# Patient Record
Sex: Female | Born: 1940 | Race: White | Hispanic: No | Smoking: Former smoker
Health system: Southern US, Community
[De-identification: ages and names within clinical notes are randomized; demographics above are authoritative.]

## PROBLEM LIST (undated history)

## (undated) DIAGNOSIS — F329 Major depressive disorder, single episode, unspecified: Secondary | ICD-10-CM

## (undated) DIAGNOSIS — E785 Hyperlipidemia, unspecified: Secondary | ICD-10-CM

## (undated) DIAGNOSIS — D649 Anemia, unspecified: Secondary | ICD-10-CM

## (undated) DIAGNOSIS — C801 Malignant (primary) neoplasm, unspecified: Secondary | ICD-10-CM

## (undated) DIAGNOSIS — M1990 Unspecified osteoarthritis, unspecified site: Secondary | ICD-10-CM

## (undated) DIAGNOSIS — Z881 Allergy status to other antibiotic agents status: Secondary | ICD-10-CM

## (undated) DIAGNOSIS — I6529 Occlusion and stenosis of unspecified carotid artery: Secondary | ICD-10-CM

## (undated) DIAGNOSIS — I10 Essential (primary) hypertension: Secondary | ICD-10-CM

## (undated) HISTORY — PX: THROAT SURGERY: SHX803

## (undated) HISTORY — DX: Anemia, unspecified: D64.9

## (undated) HISTORY — PX: APPENDECTOMY: SHX54

## (undated) HISTORY — DX: Essential (primary) hypertension: I10

## (undated) HISTORY — DX: Hyperlipidemia, unspecified: E78.5

## (undated) HISTORY — DX: Major depressive disorder, single episode, unspecified: F32.9

## (undated) HISTORY — DX: Allergy status to other antibiotic agents: Z88.1

## (undated) HISTORY — DX: Malignant (primary) neoplasm, unspecified: C80.1

## (undated) HISTORY — PX: CHOLECYSTECTOMY: SHX55

## (undated) HISTORY — DX: Occlusion and stenosis of unspecified carotid artery: I65.29

## (undated) HISTORY — DX: Unspecified osteoarthritis, unspecified site: M19.90

---

## 1993-01-24 HISTORY — PX: JOINT REPLACEMENT: SHX530

## 1998-04-14 ENCOUNTER — Other Ambulatory Visit: Admission: RE | Admit: 1998-04-14 | Discharge: 1998-04-14 | Payer: Self-pay | Admitting: Family Medicine

## 1998-06-05 ENCOUNTER — Encounter: Admission: RE | Admit: 1998-06-05 | Discharge: 1998-09-03 | Payer: Self-pay | Admitting: *Deleted

## 1999-01-25 HISTORY — PX: JOINT REPLACEMENT: SHX530

## 1999-03-15 ENCOUNTER — Encounter: Admission: RE | Admit: 1999-03-15 | Discharge: 1999-03-24 | Payer: Self-pay | Admitting: Unknown Physician Specialty

## 1999-06-22 ENCOUNTER — Other Ambulatory Visit: Admission: RE | Admit: 1999-06-22 | Discharge: 1999-06-22 | Payer: Self-pay | Admitting: Family Medicine

## 2000-08-17 ENCOUNTER — Other Ambulatory Visit: Admission: RE | Admit: 2000-08-17 | Discharge: 2000-08-17 | Payer: Self-pay | Admitting: Family Medicine

## 2002-04-02 ENCOUNTER — Ambulatory Visit (HOSPITAL_COMMUNITY): Admission: RE | Admit: 2002-04-02 | Discharge: 2002-04-02 | Payer: Self-pay | Admitting: Gastroenterology

## 2003-05-06 ENCOUNTER — Other Ambulatory Visit: Admission: RE | Admit: 2003-05-06 | Discharge: 2003-05-06 | Payer: Self-pay | Admitting: Family Medicine

## 2004-06-15 ENCOUNTER — Other Ambulatory Visit: Admission: RE | Admit: 2004-06-15 | Discharge: 2004-06-15 | Payer: Self-pay | Admitting: Family Medicine

## 2005-01-26 ENCOUNTER — Encounter: Admission: RE | Admit: 2005-01-26 | Discharge: 2005-01-26 | Payer: Self-pay | Admitting: Dentistry

## 2005-02-01 ENCOUNTER — Ambulatory Visit: Admission: RE | Admit: 2005-02-01 | Discharge: 2005-04-08 | Payer: Self-pay | Admitting: Radiation Oncology

## 2005-02-02 ENCOUNTER — Ambulatory Visit: Payer: Self-pay | Admitting: Dentistry

## 2005-03-07 ENCOUNTER — Ambulatory Visit: Payer: Self-pay | Admitting: Dentistry

## 2005-06-14 ENCOUNTER — Other Ambulatory Visit: Admission: RE | Admit: 2005-06-14 | Discharge: 2005-06-14 | Payer: Self-pay | Admitting: Family Medicine

## 2009-01-24 DIAGNOSIS — F32A Depression, unspecified: Secondary | ICD-10-CM

## 2009-01-24 HISTORY — DX: Depression, unspecified: F32.A

## 2009-05-11 DIAGNOSIS — I6529 Occlusion and stenosis of unspecified carotid artery: Secondary | ICD-10-CM

## 2009-05-11 HISTORY — DX: Occlusion and stenosis of unspecified carotid artery: I65.29

## 2010-12-21 ENCOUNTER — Other Ambulatory Visit: Payer: Self-pay | Admitting: Radiation Oncology

## 2011-06-22 ENCOUNTER — Other Ambulatory Visit: Payer: Self-pay | Admitting: Radiation Oncology

## 2012-02-17 ENCOUNTER — Encounter: Payer: Self-pay | Admitting: Vascular Surgery

## 2012-02-17 ENCOUNTER — Other Ambulatory Visit: Payer: Self-pay | Admitting: *Deleted

## 2012-03-05 ENCOUNTER — Encounter: Payer: Self-pay | Admitting: Vascular Surgery

## 2012-03-06 ENCOUNTER — Ambulatory Visit (INDEPENDENT_AMBULATORY_CARE_PROVIDER_SITE_OTHER): Payer: Medicare Other | Admitting: Vascular Surgery

## 2012-03-06 ENCOUNTER — Encounter: Payer: Self-pay | Admitting: Vascular Surgery

## 2012-03-06 VITALS — BP 157/74 | HR 88 | Ht 61.0 in | Wt 176.0 lb

## 2012-03-06 DIAGNOSIS — I6529 Occlusion and stenosis of unspecified carotid artery: Secondary | ICD-10-CM | POA: Insufficient documentation

## 2012-03-06 NOTE — Progress Notes (Signed)
[facility] [place]   Patient name: Mattia Osterman MRN: [id number] DOB: 04-01-1940 Sex: female   Referred by: Modesto Charon  Reason for referral:  Chief Complaint  Patient presents with  . New Evaluation    carotid stenosis/bruit    HISTORY OF PRESENT ILLNESS: Patient is a very pleasant 72 year old female seen today for evaluation of bilateral moderate carotid stenosis, asymptomatic. She was found to have a carotid bruit and underwent a recent carotid duplex at [facility]. I do have the study for view. She did have a prior study in 2008 as well for comparison. Patient this is specifically denies any prior amaurosis fugax transient ischemic attack or stroke. She does have a history of cardiac palpitations but denies any severe cardiac disease. She has active with no major disability  Past Medical History  Diagnosis Date  . Carotid artery occlusion 05/11/09    Bruit - Right  . Hyperlipidemia   . Hypertension   . Arthritis   . Allergy to meropenem   . Anemia   . Depression 2011  . Cancer     Oral    Past Surgical History  Procedure Laterality Date  . Cholecystectomy    . Joint replacement Right 1995      Knee  . Joint replacement Left 2001    knee  . Appendectomy      History   Social History  . Marital Status: Divorced    Spouse Name: N/A    Number of Children: N/A  . Years of Education: N/A   Occupational History  . Not on file.   Social History Main Topics  . Smoking status: Former Smoker    Types: Cigarettes    Quit date: 01/24/1993  . Smokeless tobacco: Never Used  . Alcohol Use: No  . Drug Use: No  . Sexually Active: Not on file   Other Topics Concern  . Not on file   Social History Narrative  . No narrative on file    Family History  Problem Relation Age of Onset  . Heart disease Father   . Heart attack Father   . Heart disease Sister     before age 33  . Hyperlipidemia Sister   . Hypertension Sister   . Other Sister      varicose veins  . Cancer Brother   . Diabetes Son     Allergies as of 03/06/2012 - Review Complete 03/06/2012  Allergen Reaction Noted  . Sulfa antibiotics  02/17/2012    Current Outpatient Prescriptions on File Prior to Visit  Medication Sig Dispense Refill  . amLODipine (NORVASC) 5 MG tablet Take 5 mg by mouth daily.      Marland Kitchen aspirin 81 MG tablet Take 81 mg by mouth daily.      . Cholecalciferol (VITAMIN D3) 3000 UNITS TABS Take by mouth.      . dexlansoprazole (DEXILANT) 60 MG capsule Take 60 mg by mouth daily.      Marland Kitchen escitalopram (LEXAPRO) 20 MG tablet Take 20 mg by mouth daily.      Marland Kitchen ezetimibe (ZETIA) 10 MG tablet Take 10 mg by mouth daily.      . fenofibrate (TRICOR) 145 MG tablet Take 145 mg by mouth daily.      . fish oil-omega-3 fatty acids 1000 MG capsule Take 2 g by mouth daily.      . folic acid (FOLVITE) 1 MG tablet Take 1 mg by mouth daily.      Marland Kitchen  pilocarpine (SALAGEN) 5 MG tablet TAKE 1 TABLET BY MOUTH 3 TIMES DAILY AND AT BEDTIME AS NEEDED FOR DRYMOUTH  90 tablet  3  . pravastatin (PRAVACHOL) 10 MG tablet Take 10 mg by mouth daily.      Marland Kitchen buPROPion (WELLBUTRIN SR) 200 MG 12 hr tablet Take 200 mg by mouth 2 (two) times daily.      . Cyanocobalamin (VITAMIN B 12 PO) Take by mouth.      . zolpidem (AMBIEN) 10 MG tablet Take 10 mg by mouth at bedtime as needed.       No current facility-administered medications on file prior to visit.     REVIEW OF SYSTEMS:  Positives indicated with an "X"  CARDIOVASCULAR:  [ ]  chest pain   [ ]  chest pressure   [x ] palpitations   [ ]  orthopnea   [x ] dyspnea on exertion   [ ]  claudication   [ ]  rest pain   [ ]  DVT   [ ]  phlebitis PULMONARY:   [ ]  productive cough   [ ]  asthma   [ ]  wheezing NEUROLOGIC:   [x ] weakness  [x ] paresthesias  [ ]  aphasia  [ ]  amaurosis  [ ]  dizziness HEMATOLOGIC:   [ ]  bleeding problems   [ ]  clotting disorders MUSCULOSKELETAL:  [ ]  joint pain   [ ]  joint swelling GASTROINTESTINAL: [ ]   blood in  stool  [ ]   hematemesis GENITOURINARY:  [ ]   dysuria  [ ]   hematuria PSYCHIATRIC:  [ ]  history of major depression INTEGUMENTARY:  [ ]  rashes  [ ]  ulcers CONSTITUTIONAL:  [ ]  fever   [ ]  chills  PHYSICAL EXAMINATION:  General: The patient is a well-nourished female, in no acute distress. Vital signs are BP 157/74  Pulse 88  Ht 5\' 1"  (1.549 m)  Wt 176 lb (79.833 kg)  BMI 33.27 kg/m2  SpO2 98% Pulmonary: There is a good air exchange bilaterally without wheezing or rales. Abdomen: Soft and non-tender with normal pitch bowel sounds. Musculoskeletal: There are no major deformities.  There is no significant extremity pain. Neurologic: No focal weakness or paresthesias are detected, Skin: There are no ulcer or rashes noted. Psychiatric: The patient has normal affect. Cardiovascular: There is a regular rate and rhythm without significant murmur appreciated. Carotid arteries reveal soft bruit on the right. I do not appreciate a bruit on the left Pulse status 2+ radial 2+ femoral 2+ dorsalis pedis pulses bilaterally   Vascular Lab Studies: Reviewed her study from 2012/02/25 at [facility]. This reveals a 50-69% stenoses bilaterally with calcified plaque. Her flow velocities atelectasis in the lower end of this range.   Impression and Plan:  Moderate asymptomatic bilateral carotid stenosis. I discussed this at length with the patient and her family present. I recommend that we see her again in one year for continued duplex surveillance. I explained symptoms of carotid disease to the patient and her family and they will notify should this occur. As long as she remains asymptomatic we will duplex surveillance in our office.    Bailley Guilford [facility] [place] Office: [phone]

## 2012-03-07 NOTE — Addendum Note (Signed)
Addended by: Sharee Pimple on: 03/07/2012 09:58 AM   Modules accepted: Orders

## 2012-04-24 ENCOUNTER — Ambulatory Visit (INDEPENDENT_AMBULATORY_CARE_PROVIDER_SITE_OTHER): Payer: Medicare Other | Admitting: Family Medicine

## 2012-04-24 ENCOUNTER — Other Ambulatory Visit: Payer: Self-pay | Admitting: Family Medicine

## 2012-04-24 ENCOUNTER — Encounter: Payer: Self-pay | Admitting: Family Medicine

## 2012-04-24 VITALS — BP 140/72 | HR 82 | Temp 97.4°F | Ht 60.5 in | Wt 176.8 lb

## 2012-04-24 DIAGNOSIS — E785 Hyperlipidemia, unspecified: Secondary | ICD-10-CM

## 2012-04-24 DIAGNOSIS — R739 Hyperglycemia, unspecified: Secondary | ICD-10-CM

## 2012-04-24 DIAGNOSIS — G4700 Insomnia, unspecified: Secondary | ICD-10-CM

## 2012-04-24 DIAGNOSIS — M129 Arthropathy, unspecified: Secondary | ICD-10-CM

## 2012-04-24 DIAGNOSIS — D649 Anemia, unspecified: Secondary | ICD-10-CM

## 2012-04-24 DIAGNOSIS — I10 Essential (primary) hypertension: Secondary | ICD-10-CM | POA: Insufficient documentation

## 2012-04-24 DIAGNOSIS — M1990 Unspecified osteoarthritis, unspecified site: Secondary | ICD-10-CM | POA: Insufficient documentation

## 2012-04-24 DIAGNOSIS — R7309 Other abnormal glucose: Secondary | ICD-10-CM

## 2012-04-24 LAB — POCT CBC
Granulocyte percent: 46.6 %G (ref 37–80)
HCT, POC: 32 % — AB (ref 37.7–47.9)
Hemoglobin: 10.8 g/dL — AB (ref 12.2–16.2)
Lymph, poc: 2.2 (ref 0.6–3.4)
MCH, POC: 29.6 pg (ref 27–31.2)
MCHC: 33.8 g/dL (ref 31.8–35.4)
MCV: 87.5 fL (ref 80–97)
MPV: 7.9 fL (ref 0–99.8)
POC Granulocyte: 2.3 (ref 2–6.9)
POC LYMPH PERCENT: 43.2 %L (ref 10–50)
Platelet Count, POC: 235 10*3/uL (ref 142–424)
RBC: 3.7 M/uL — AB (ref 4.04–5.48)
RDW, POC: 14.3 %
WBC: 5 10*3/uL (ref 4.6–10.2)

## 2012-04-24 LAB — POCT GLYCOSYLATED HEMOGLOBIN (HGB A1C): Hemoglobin A1C: 4.9

## 2012-04-24 NOTE — Progress Notes (Signed)
Subjective:     Patient ID: Shirley Brown, female   DOB: [birth year], 72 y.o.   MRN: [id number]  HPI Patient comes in for follow up of her medical problem. The has difficulty sleeping at night. Her insurance has limited the duration now for Zolpidem prescription. After this last prescription she will need a different medicine for her insomnia. She will consider anything even trazodone. Arthritis is status quo. Hypertension no headache chest pain palpitations nor pedal edema.  history of anemia which we need to recheck. Also a history of hyperlipidemia and hypertension and hyperglycemia.    PMH/PSH: reviewed/updated in Epic  SH/FH: reviewed/updated in Epic  Allergies: reviewed/updated in Epic  Medications: reviewed/updated in Epic  Immunizations: reviewed/updated in Epic     Review of Systems No other complaints    Objective:   Physical Exam On examination she appeared in good health and spirits. Short stature obese.  Vital signs as documented.BP 140/72  Pulse 82  Temp(Src) 97.4 F (36.3 C) (Oral)  Ht 5' 0.5" (1.537 m)  Wt 176 lb 12.8 oz (80.196 kg)  BMI 33.95 kg/m2  Skin warm and dry and without overt rashes.  Neck without JVD.  Lungs clear.  Heart exam notable for regular rhythm, normal sounds and absence of murmurs, rubs or gallops. Abdomen unremarkable and without evidence of organomegaly, masses, or abdominal aortic enlargement.  Extremities nonedematous.    Assessment:     HLD (hyperlipidemia) - Plan: COMPLETE METABOLIC PANEL WITH GFR, NMR Lipoprofile with Lipids  Arthritis - Plan: COMPLETE METABOLIC PANEL WITH GFR  HTN (hypertension)  Hyperglycemia - Plan: COMPLETE METABOLIC PANEL WITH GFR, POCT glycosylated hemoglobin (Hb A1C)  Insomnia  Anemia, unspecified - Plan: POCT CBC       Plan:     Orders Placed This Encounter  Procedures  . COMPLETE METABOLIC PANEL WITH GFR  . NMR Lipoprofile with Lipids  . POCT glycosylated hemoglobin (Hb A1C)  .  POCT CBC   Results for orders placed in visit on 04/24/12 (from the past 24 hour(s))  POCT GLYCOSYLATED HEMOGLOBIN (HGB A1C)     Status: None   Collection Time    04/24/12 12:55 PM      Result Value Range   Hemoglobin A1C 4.9    POCT CBC     Status: Abnormal   Collection Time    04/24/12 12:55 PM      Result Value Range   WBC 5.0  4.6 - 10.2 K/uL   Lymph, poc 2.2  0.6 - 3.4   POC LYMPH PERCENT 43.2  10 - 50 %L   POC Granulocyte 2.3  2 - 6.9   Granulocyte percent 46.6  37 - 80 %G   RBC 3.7 (*) 4.04 - 5.48 M/uL   Hemoglobin 10.8 (*) 12.2 - 16.2 g/dL   HCT, POC 46.9 (*) 62.9 - 47.9 %   MCV 87.5  80 - 97 fL   MCH, POC 29.6  27 - 31.2 pg   MCHC 33.8  31.8 - 35.4 g/dL   RDW, POC 52.8     Platelet Count, POC 235.0  142 - 424 K/uL   MPV 7.9  0 - 99.8 fL   Discussed diet and exercise. Weight reduction. Discuss alternative to Ambien/zolpidem which she takes for sleep. Just started the last 30 days of zolpidem . Consider trazodone or some other medications for sleep. Await lab results.  No orders of the defined types were placed in this encounter.   Jimel Myler P. [place] Charon, M.D.

## 2012-04-25 LAB — COMPLETE METABOLIC PANEL WITH GFR
ALT: 9 U/L (ref 0–35)
AST: 21 U/L (ref 0–37)
Albumin: 4.1 g/dL (ref 3.5–5.2)
Alkaline Phosphatase: 49 U/L (ref 39–117)
BUN: 12 mg/dL (ref 6–23)
CO2: 27 mEq/L (ref 19–32)
Calcium: 10 mg/dL (ref 8.4–10.5)
Chloride: 99 mEq/L (ref 96–112)
Creat: 0.88 mg/dL (ref 0.50–1.10)
GFR, Est African American: 77 mL/min
GFR, Est Non African American: 67 mL/min
Glucose, Bld: 82 mg/dL (ref 70–99)
Potassium: 4.6 mEq/L (ref 3.5–5.3)
Sodium: 137 mEq/L (ref 135–145)
Total Bilirubin: 0.5 mg/dL (ref 0.3–1.2)
Total Protein: 6.4 g/dL (ref 6.0–8.3)

## 2012-04-25 LAB — NMR LIPOPROFILE WITH LIPIDS
Cholesterol, Total: 131 mg/dL (ref <200)
HDL Particle Number: 49 umol/L (ref >=30.5)
HDL Size: 9.2 nm (ref >=9.2)
HDL-C: 47 mg/dL (ref >=40)
LDL (calc): 58 mg/dL (ref <100)
LDL Particle Number: 984 nmol/L (ref <1000)
LDL Size: 20.1 nm — ABNORMAL LOW (ref >20.5)
LP-IR Score: 63 — ABNORMAL HIGH (ref <=45)
Large HDL-P: 4.8 umol/L (ref >=4.8)
Large VLDL-P: 5.2 nmol/L — ABNORMAL HIGH (ref <=2.7)
Small LDL Particle Number: 682 nmol/L — ABNORMAL HIGH (ref <=527)
Triglycerides: 132 mg/dL (ref <150)
VLDL Size: 54.4 nm — ABNORMAL HIGH (ref >=46.6)

## 2012-04-26 ENCOUNTER — Other Ambulatory Visit: Payer: Self-pay | Admitting: Family Medicine

## 2012-04-26 ENCOUNTER — Encounter: Payer: Self-pay | Admitting: *Deleted

## 2012-05-26 ENCOUNTER — Other Ambulatory Visit: Payer: Self-pay | Admitting: Family Medicine

## 2012-06-07 ENCOUNTER — Telehealth: Payer: Self-pay | Admitting: Family Medicine

## 2012-06-07 ENCOUNTER — Other Ambulatory Visit: Payer: Self-pay | Admitting: Family Medicine

## 2012-06-07 DIAGNOSIS — G4700 Insomnia, unspecified: Secondary | ICD-10-CM

## 2012-06-07 MED ORDER — TRAZODONE 25 MG HALF TABLET: 25.0000 mg | ORAL_TABLET | Freq: Every day | ORAL | Status: DC

## 2012-06-07 NOTE — Telephone Encounter (Signed)
Ordered trazodone in EPIC 25 mg at night.please let her know. Thanks.

## 2012-06-08 NOTE — Telephone Encounter (Signed)
Pt aware of new script- trazodone

## 2012-06-21 ENCOUNTER — Encounter: Payer: Self-pay | Admitting: *Deleted

## 2012-06-25 ENCOUNTER — Other Ambulatory Visit: Payer: Self-pay | Admitting: Family Medicine

## 2012-07-04 ENCOUNTER — Other Ambulatory Visit: Payer: Self-pay | Admitting: Family Medicine

## 2012-07-26 ENCOUNTER — Other Ambulatory Visit: Payer: Self-pay | Admitting: Family Medicine

## 2012-07-26 ENCOUNTER — Ambulatory Visit: Payer: Medicare Other | Admitting: Family Medicine

## 2012-07-29 ENCOUNTER — Other Ambulatory Visit: Payer: Self-pay | Admitting: Family Medicine

## 2012-07-30 ENCOUNTER — Ambulatory Visit (INDEPENDENT_AMBULATORY_CARE_PROVIDER_SITE_OTHER): Payer: Medicare Other | Admitting: Family Medicine

## 2012-07-30 ENCOUNTER — Encounter: Payer: Self-pay | Admitting: Family Medicine

## 2012-07-30 VITALS — BP 117/62 | HR 92 | Temp 97.5°F | Wt 178.6 lb

## 2012-07-30 DIAGNOSIS — M129 Arthropathy, unspecified: Secondary | ICD-10-CM

## 2012-07-30 DIAGNOSIS — G4700 Insomnia, unspecified: Secondary | ICD-10-CM

## 2012-07-30 DIAGNOSIS — M1990 Unspecified osteoarthritis, unspecified site: Secondary | ICD-10-CM

## 2012-07-30 DIAGNOSIS — R739 Hyperglycemia, unspecified: Secondary | ICD-10-CM

## 2012-07-30 DIAGNOSIS — K219 Gastro-esophageal reflux disease without esophagitis: Secondary | ICD-10-CM

## 2012-07-30 DIAGNOSIS — I10 Essential (primary) hypertension: Secondary | ICD-10-CM

## 2012-07-30 DIAGNOSIS — E785 Hyperlipidemia, unspecified: Secondary | ICD-10-CM

## 2012-07-30 DIAGNOSIS — I6529 Occlusion and stenosis of unspecified carotid artery: Secondary | ICD-10-CM

## 2012-07-30 DIAGNOSIS — R7309 Other abnormal glucose: Secondary | ICD-10-CM

## 2012-07-30 LAB — COMPLETE METABOLIC PANEL WITH GFR
ALT: 8 U/L (ref 0–35)
AST: 23 U/L (ref 0–37)
Albumin: 4.6 g/dL (ref 3.5–5.2)
Alkaline Phosphatase: 35 U/L — ABNORMAL LOW (ref 39–117)
BUN: 13 mg/dL (ref 6–23)
CO2: 28 mEq/L (ref 19–32)
Calcium: 9.7 mg/dL (ref 8.4–10.5)
Chloride: 103 mEq/L (ref 96–112)
Creat: 1.11 mg/dL — ABNORMAL HIGH (ref 0.50–1.10)
GFR, Est African American: 58 mL/min — ABNORMAL LOW
GFR, Est Non African American: 50 mL/min — ABNORMAL LOW
Glucose, Bld: 152 mg/dL — ABNORMAL HIGH (ref 70–99)
Potassium: 4 mEq/L (ref 3.5–5.3)
Sodium: 137 mEq/L (ref 135–145)
Total Bilirubin: 0.4 mg/dL (ref 0.3–1.2)
Total Protein: 6.7 g/dL (ref 6.0–8.3)

## 2012-07-30 MED ORDER — PANTOPRAZOLE SODIUM 40 MG PO TBEC
40.0000 mg | DELAYED_RELEASE_TABLET | Freq: Every day | ORAL | Status: DC
Start: 2012-07-30 — End: 2012-11-19

## 2012-07-30 MED ORDER — MELOXICAM 15 MG PO TABS: 15.0000 mg | ORAL_TABLET | Freq: Every day | ORAL | Status: DC

## 2012-07-30 NOTE — Progress Notes (Signed)
Patient ID: Shirley Brown, female   DOB: 1940-02-28, 72 y.o.   MRN: [id number] SUBJECTIVE: CC: Chief Complaint  Patient presents with  . Follow-up    3 month follow up needs something other than dexilant cant afford . wants something to rest    HPI: Patient is here for follow up of hyperlipidemia/hypertension/hyperglycemia: denies Headache;denies Chest Pain;denies weakness;denies Shortness of Breath and orthopnea;denies Visual changes;denies palpitations;denies cough;denies pedal edema;denies symptoms of TIA or stroke;deniesClaudication symptoms. admits to Compliance with medications; denies Problems with medications.  Having chronic back pain, spine crackles and pops.tried tylenol.  Past Medical History  Diagnosis Date  . Carotid artery occlusion 05/11/09    Bruit - Right  . Hyperlipidemia   . Hypertension   . Arthritis   . Allergy to meropenem   . Anemia   . Depression 2011  . Cancer     Oral   Past Surgical History  Procedure Laterality Date  . Cholecystectomy    . Joint replacement Right 1995      Knee  . Joint replacement Left 2001    knee  . Appendectomy     History   Social History  . Marital Status: Divorced    Spouse Name: N/A    Number of Children: N/A  . Years of Education: N/A   Occupational History  . Not on file.   Social History Main Topics  . Smoking status: Former Smoker    Types: Cigarettes    Quit date: 01/24/1993  . Smokeless tobacco: Never Used  . Alcohol Use: No  . Drug Use: No  . Sexually Active: Not on file   Other Topics Concern  . Not on file   Social History Narrative  . No narrative on file   Family History  Problem Relation Age of Onset  . Heart disease Father   . Heart attack Father   . Heart disease Sister     before age 53  . Hyperlipidemia Sister   . Hypertension Sister   . Other Sister     varicose veins  . Cancer Brother   . Diabetes Son    Current Outpatient Prescriptions on File Prior to Visit   Medication Sig Dispense Refill  . amLODipine (NORVASC) 5 MG tablet Take 5 mg by mouth daily.      Marland Kitchen aspirin 81 MG tablet Take 81 mg by mouth daily.      . Cholecalciferol (VITAMIN D3) 3000 UNITS TABS Take by mouth.      . Cyanocobalamin (VITAMIN B 12 PO) Take by mouth.      . dexlansoprazole (DEXILANT) 60 MG capsule Take 60 mg by mouth daily.      Marland Kitchen escitalopram (LEXAPRO) 20 MG tablet Take 20 mg by mouth daily.      . fenofibrate (TRICOR) 145 MG tablet TAKE ONE TABLET BY MOUTH ONE TIME DAILY  30 tablet  3  . fish oil-omega-3 fatty acids 1000 MG capsule Take 2 g by mouth daily.      . folic acid (FOLVITE) 1 MG tablet TAKE ONE TABLET BY MOUTH ONE TIME DAILY  30 tablet  1  . pilocarpine (SALAGEN) 5 MG tablet TAKE 1 TABLET BY MOUTH 3 TIMES DAILY AND AT BEDTIME AS NEEDED FOR DRYMOUTH  90 tablet  3  . pravastatin (PRAVACHOL) 10 MG tablet TAKE ONE TABLET BY MOUTH AT BEDTIME  30 tablet  3  . vitamin E 200 UNIT capsule Take 200 Units by mouth daily.      Marland Kitchen  ZETIA 10 MG tablet TAKE ONE TABLET BY MOUTH ONE TIME DAILY  30 tablet  1  . zolpidem (AMBIEN) 10 MG tablet Take 10 mg by mouth at bedtime as needed.      Marland Kitchen buPROPion (WELLBUTRIN SR) 200 MG 12 hr tablet TAKE ONE TABLET BY MOUTH ONE  TIME DAILY  30 tablet  1  . traZODone (DESYREL) 25 mg TABS Take 0.5 tablets (25 mg total) by mouth at bedtime.  15 tablet  3   No current facility-administered medications on file prior to visit.   Allergies  Allergen Reactions  . Bupropion     Lips swelled  . Desyrel (Trazodone)     Keeps me up all night  . Sulfa Antibiotics     There is no immunization history on file for this patient. Prior to Admission medications   Medication Sig Start Date End Date Taking? Authorizing Provider  amLODipine (NORVASC) 5 MG tablet Take 5 mg by mouth daily.   Yes Historical Provider, MD  aspirin 81 MG tablet Take 81 mg by mouth daily.   Yes Historical Provider, MD  Cholecalciferol (VITAMIN D3) 3000 UNITS TABS Take by mouth.    Yes Historical Provider, MD  Cyanocobalamin (VITAMIN B 12 PO) Take by mouth.   Yes Historical Provider, MD  dexlansoprazole (DEXILANT) 60 MG capsule Take 60 mg by mouth daily.   Yes Historical Provider, MD  escitalopram (LEXAPRO) 20 MG tablet Take 20 mg by mouth daily.   Yes Historical Provider, MD  fenofibrate (TRICOR) 145 MG tablet TAKE ONE TABLET BY MOUTH ONE TIME DAILY 06/25/12  Yes Ileana Ladd, MD  fish oil-omega-3 fatty acids 1000 MG capsule Take 2 g by mouth daily.   Yes Historical Provider, MD  folic acid (FOLVITE) 1 MG tablet TAKE ONE TABLET BY MOUTH ONE TIME DAILY 07/26/12  Yes Ileana Ladd, MD  pilocarpine (SALAGEN) 5 MG tablet TAKE 1 TABLET BY MOUTH 3 TIMES DAILY AND AT BEDTIME AS NEEDED FOR [facility] 06/22/11  Yes Oneita Hurt, MD  pravastatin (PRAVACHOL) 10 MG tablet TAKE ONE TABLET BY MOUTH AT BEDTIME 07/04/12  Yes Ileana Ladd, MD  vitamin E 200 UNIT capsule Take 200 Units by mouth daily.   Yes Historical Provider, MD  ZETIA 10 MG tablet TAKE ONE TABLET BY MOUTH ONE TIME DAILY 07/26/12  Yes Ileana Ladd, MD  zolpidem (AMBIEN) 10 MG tablet Take 10 mg by mouth at bedtime as needed.   Yes Historical Provider, MD  buPROPion (WELLBUTRIN SR) 200 MG 12 hr tablet TAKE ONE TABLET BY MOUTH ONE  TIME DAILY 05/26/12   Ileana Ladd, MD  traZODone (DESYREL) 25 mg TABS Take 0.5 tablets (25 mg total) by mouth at bedtime. 06/07/12   Ileana Ladd, MD    ROS: As above in the HPI. All other systems are stable or negative.  OBJECTIVE: APPEARANCE:  Patient in no acute distress.The patient appeared well nourished and normally developed. Acyanotic. Waist: VITAL SIGNS:BP 117/62  Pulse 92  Temp(Src) 97.5 F (36.4 C) (Oral)  Wt 178 lb 9.6 oz (81.012 kg)  BMI 34.29 kg/m2  Obese WF  SKIN: warm and  Dry without overt rashes, tattoos and scars  HEAD and Neck: without JVD, Head and scalp: normal Eyes:No scleral icterus. Fundi normal, eye movements normal. Ears: Auricle normal, canal  normal, Tympanic membranes normal, insufflation normal. Nose: normal Throat: normal Neck & thyroid: normal  CHEST & LUNGS: Chest wall: normal Lungs: Clear  CVS: Reveals the  PMI to be normally located. Regular rhythm, First and Second Heart sounds are normal,  absence of murmurs, rubs or gallops. Peripheral vasculature: Radial pulses: normal Dorsal pedis pulses: normal Posterior pulses: normal  ABDOMEN:  Appearance: obese Benign, no organomegaly, no masses, no Abdominal Aortic enlargement. No Guarding , no rebound. No Bruits. Bowel sounds: normal  RECTAL: N/A GU: N/A  EXTREMETIES: nonedematous. Both Femoral and Pedal pulses are normal.  MUSCULOSKELETAL:  Spine: mild kyphosis Knees: crepitus and mild pain on ROM.ligaments intact.  NEUROLOGIC: oriented to time,place and person; nonfocal.  ASSESSMENT: HLD (hyperlipidemia) - Plan: COMPLETE METABOLIC PANEL WITH GFR, NMR Lipoprofile with Lipids  HTN (hypertension) - Plan: COMPLETE METABOLIC PANEL WITH GFR  Hyperglycemia  Insomnia  Occlusion and stenosis of carotid artery without mention of cerebral infarction, unspecified laterality  Arthritis - Plan: meloxicam (MOBIC) 15 MG tablet, COMPLETE METABOLIC PANEL WITH GFR  GERD (gastroesophageal reflux disease) - Plan: pantoprazole (PROTONIX) 40 MG tablet  PLAN: Orders Placed This Encounter  Procedures  . COMPLETE METABOLIC PANEL WITH GFR  . NMR Lipoprofile with Lipids   Meds ordered this encounter  Medications  . meloxicam (MOBIC) 15 MG tablet    Sig: Take 1 tablet (15 mg total) by mouth daily.    Dispense:  30 tablet    Refill:  3  . pantoprazole (PROTONIX) 40 MG tablet    Sig: Take 1 tablet (40 mg total) by mouth daily.    Dispense:  30 tablet    Refill:  3   weight reduction.  Keep active.  Discussed NSAID use and  Risks.  Return in about 2 months (around 09/30/2012) for Recheck medical problems.  Inioluwa Boulay P. [place] Charon, M.D.

## 2012-07-31 LAB — NMR LIPOPROFILE WITH LIPIDS
Cholesterol, Total: 136 mg/dL (ref <200)
HDL Particle Number: 46.4 umol/L (ref >=30.5)
HDL Size: 9.3 nm (ref >=9.2)
HDL-C: 57 mg/dL (ref >=40)
LDL (calc): 64 mg/dL (ref <100)
LDL Particle Number: 985 nmol/L (ref <1000)
LDL Size: 19.6 nm — ABNORMAL LOW (ref >20.5)
LP-IR Score: 51 — ABNORMAL HIGH (ref <=45)
Large HDL-P: 5.5 umol/L (ref >=4.8)
Large VLDL-P: 2.9 nmol/L — ABNORMAL HIGH (ref <=2.7)
Small LDL Particle Number: 663 nmol/L — ABNORMAL HIGH (ref <=527)
Triglycerides: 75 mg/dL (ref <150)
VLDL Size: 49.9 nm — ABNORMAL HIGH (ref <=46.6)

## 2012-08-01 NOTE — Progress Notes (Signed)
Quick Note:  Labs abnormal. Sugar is High and the creatinine went up a little but still okay. We need a HGBa1c please. The rest of the labs are okay. The cholesterol is at goal. ______

## 2012-08-03 NOTE — Addendum Note (Signed)
Addended by: Orma Render F on: 08/03/2012 03:46 PM   Modules accepted: Orders

## 2012-08-25 ENCOUNTER — Other Ambulatory Visit: Payer: Self-pay | Admitting: Family Medicine

## 2012-09-20 ENCOUNTER — Other Ambulatory Visit: Payer: Self-pay | Admitting: Family Medicine

## 2012-10-02 ENCOUNTER — Ambulatory Visit (INDEPENDENT_AMBULATORY_CARE_PROVIDER_SITE_OTHER): Payer: Medicare Other | Admitting: Family Medicine

## 2012-10-02 ENCOUNTER — Encounter: Payer: Self-pay | Admitting: Family Medicine

## 2012-10-02 VITALS — BP 127/64 | HR 79 | Temp 97.1°F | Ht 61.0 in | Wt 179.4 lb

## 2012-10-02 DIAGNOSIS — I10 Essential (primary) hypertension: Secondary | ICD-10-CM

## 2012-10-02 DIAGNOSIS — R7309 Other abnormal glucose: Secondary | ICD-10-CM

## 2012-10-02 DIAGNOSIS — M1990 Unspecified osteoarthritis, unspecified site: Secondary | ICD-10-CM

## 2012-10-02 DIAGNOSIS — R739 Hyperglycemia, unspecified: Secondary | ICD-10-CM

## 2012-10-02 DIAGNOSIS — G4700 Insomnia, unspecified: Secondary | ICD-10-CM

## 2012-10-02 DIAGNOSIS — M129 Arthropathy, unspecified: Secondary | ICD-10-CM

## 2012-10-02 DIAGNOSIS — E785 Hyperlipidemia, unspecified: Secondary | ICD-10-CM

## 2012-10-02 DIAGNOSIS — Z79899 Other long term (current) drug therapy: Secondary | ICD-10-CM

## 2012-10-02 DIAGNOSIS — I6529 Occlusion and stenosis of unspecified carotid artery: Secondary | ICD-10-CM

## 2012-10-02 NOTE — Progress Notes (Signed)
Patient ID: Shirley Brown, female   DOB: 04-09-1940, 72 y.o.   MRN: [id number] SUBJECTIVE: CC: Chief Complaint  Patient presents with  . Hypertension  . Hyperlipidemia  . Gastrophageal Reflux  . Depression    HPI: Here for follow up of the 2 new meds.that were Rx. Ie, PPI and NSAID. Works minimally for her back pain, however, choices are limited. Dietary changes minimal.  Patient is here for follow up of hypertension/HLD denies Headache;deniesChest Pain;denies weakness;denies Shortness of Breath or Orthopnea;denies Visual changes;denies palpitations;denies cough;denies pedal edema;denies symptoms of TIA or stroke; admits to Compliance with medications. denies Problems with medications.   Past Medical History  Diagnosis Date  . Carotid artery occlusion 05/11/09    Bruit - Right  . Hyperlipidemia   . Hypertension   . Arthritis   . Allergy to meropenem   . Anemia   . Depression 2011  . Cancer     Oral   Past Surgical History  Procedure Laterality Date  . Cholecystectomy    . Joint replacement Right 1995      Knee  . Joint replacement Left 2001    knee  . Appendectomy     History   Social History  . Marital Status: Divorced    Spouse Name: N/A    Number of Children: N/A  . Years of Education: N/A   Occupational History  . Not on file.   Social History Main Topics  . Smoking status: Former Smoker    Types: Cigarettes    Quit date: 01/24/1993  . Smokeless tobacco: Never Used  . Alcohol Use: No  . Drug Use: No  . Sexual Activity: Not on file   Other Topics Concern  . Not on file   Social History Narrative  . No narrative on file   Family History  Problem Relation Age of Onset  . Heart disease Father   . Heart attack Father   . Heart disease Sister     before age 62  . Hyperlipidemia Sister   . Hypertension Sister   . Other Sister     varicose veins  . Cancer Brother   . Diabetes Son    Current Outpatient Prescriptions on File Prior to Visit   Medication Sig Dispense Refill  . amLODipine (NORVASC) 5 MG tablet TAKE ONE TABLET BY MOUTH ONE TIME DAILY  30 tablet  3  . aspirin 81 MG tablet Take 81 mg by mouth daily.      . Cholecalciferol (VITAMIN D3) 3000 UNITS TABS Take by mouth.      . Cyanocobalamin (VITAMIN B 12 PO) Take by mouth.      . escitalopram (LEXAPRO) 20 MG tablet TAKE ONE TABLET BY MOUTH ONE TIME DAILY  30 tablet  2  . fenofibrate (TRICOR) 145 MG tablet TAKE ONE TABLET BY MOUTH ONE TIME DAILY  30 tablet  3  . fish oil-omega-3 fatty acids 1000 MG capsule Take 2 g by mouth daily.      . folic acid (FOLVITE) 1 MG tablet TAKE ONE TABLET BY MOUTH ONE TIME DAILY  30 tablet  3  . meloxicam (MOBIC) 15 MG tablet Take 1 tablet (15 mg total) by mouth daily.  30 tablet  3  . pantoprazole (PROTONIX) 40 MG tablet Take 1 tablet (40 mg total) by mouth daily.  30 tablet  3  . pilocarpine (SALAGEN) 5 MG tablet TAKE 1 TABLET BY MOUTH 3 TIMES DAILY AND AT BEDTIME AS NEEDED FOR DRYMOUTH  90  tablet  3  . pravastatin (PRAVACHOL) 10 MG tablet TAKE ONE TABLET BY MOUTH AT BEDTIME  30 tablet  3  . traZODone (DESYREL) 25 mg TABS Take 0.5 tablets (25 mg total) by mouth at bedtime.  15 tablet  3  . vitamin E 200 UNIT capsule Take 200 Units by mouth daily.       No current facility-administered medications on file prior to visit.   Allergies  Allergen Reactions  . Bupropion     Lips swelled  . Desyrel [Trazodone]     Keeps me up all night  . Sulfa Antibiotics     There is no immunization history on file for this patient. Prior to Admission medications   Medication Sig Start Date End Date Taking? Authorizing Provider  amLODipine (NORVASC) 5 MG tablet TAKE ONE TABLET BY MOUTH ONE TIME DAILY 08/25/12  Yes Ileana Ladd, MD  aspirin 81 MG tablet Take 81 mg by mouth daily.   Yes Historical Provider, MD  Cholecalciferol (VITAMIN D3) 3000 UNITS TABS Take by mouth.   Yes Historical Provider, MD  Cyanocobalamin (VITAMIN B 12 PO) Take by mouth.   Yes  Historical Provider, MD  escitalopram (LEXAPRO) 20 MG tablet TAKE ONE TABLET BY MOUTH ONE TIME DAILY 07/29/12  Yes Ileana Ladd, MD  fenofibrate (TRICOR) 145 MG tablet TAKE ONE TABLET BY MOUTH ONE TIME DAILY 06/25/12  Yes Ileana Ladd, MD  fish oil-omega-3 fatty acids 1000 MG capsule Take 2 g by mouth daily.   Yes Historical Provider, MD  folic acid (FOLVITE) 1 MG tablet TAKE ONE TABLET BY MOUTH ONE TIME DAILY 09/20/12  Yes Ileana Ladd, MD  meloxicam (MOBIC) 15 MG tablet Take 1 tablet (15 mg total) by mouth daily. 07/30/12  Yes Ileana Ladd, MD  pantoprazole (PROTONIX) 40 MG tablet Take 1 tablet (40 mg total) by mouth daily. 07/30/12  Yes Ileana Ladd, MD  pilocarpine (SALAGEN) 5 MG tablet TAKE 1 TABLET BY MOUTH 3 TIMES DAILY AND AT BEDTIME AS NEEDED FOR [facility] 06/22/11  Yes Oneita Hurt, MD  pravastatin (PRAVACHOL) 10 MG tablet TAKE ONE TABLET BY MOUTH AT BEDTIME 07/04/12  Yes Ileana Ladd, MD  traZODone (DESYREL) 25 mg TABS Take 0.5 tablets (25 mg total) by mouth at bedtime. 06/07/12  Yes Ileana Ladd, MD  vitamin E 200 UNIT capsule Take 200 Units by mouth daily.   Yes Historical Provider, MD     ROS: As above in the HPI. All other systems are stable or negative.  OBJECTIVE: APPEARANCE:  Patient in no acute distress.The patient appeared well nourished and normally developed. Acyanotic. Waist: VITAL SIGNS:BP 127/64  Pulse 79  Temp(Src) 97.1 F (36.2 C) (Oral)  Ht 5\' 1"  (1.549 m)  Wt 179 lb 6.4 oz (81.375 kg)  BMI 33.91 kg/m2 Obese WF  SKIN: warm and  Dry without overt rashes, tattoos and scars  HEAD and Neck: without JVD, Head and scalp: normal Eyes:No scleral icterus. Fundi normal, eye movements normal. Ears: Auricle normal, canal normal, Tympanic membranes normal, insufflation normal. Nose: normal Throat: normal Neck & thyroid: normal  CHEST & LUNGS: Chest wall: normal Lungs: Clear  CVS: Reveals the PMI to be normally located. Regular rhythm, First and  Second Heart sounds are normal,  absence of murmurs, rubs or gallops. Peripheral vasculature: Radial pulses: normal Dorsal pedis pulses: normal Posterior pulses: normal  ABDOMEN:  Appearance: obese Benign, no organomegaly, no masses, no Abdominal Aortic enlargement. No Guarding ,  no rebound. No Bruits. Bowel sounds: normal  RECTAL: N/A GU: N/A  EXTREMETIES: nonedematous.  MUSCULOSKELETAL:  Spine: reduced ROM due to back pain.   NEUROLOGIC: oriented to time,place and person; nonfocal.  ASSESSMENT: Arthritis - Plan: CMP14+EGFR  HLD (hyperlipidemia)  HTN (hypertension) - Plan: CMP14+EGFR  Hyperglycemia  Insomnia  Occlusion and stenosis of carotid artery without mention of cerebral infarction, unspecified laterality  Medication management - Plan: CMP14+EGFR   PLAN: Orders Placed This Encounter  Procedures  . CMP14+EGFR    Continue the same medications. Back care and back exercises. Diet , exercise and weight loss recommended.  Return in about 2 months (around 12/02/2012) for Recheck medical problems.  Terressa Evola P. [place] Charon, M.D.

## 2012-10-03 LAB — CMP14+EGFR
ALT: 9 IU/L (ref 0–32)
AST: 24 IU/L (ref 0–40)
Albumin/Globulin Ratio: 2.5 (ref 1.1–2.5)
Albumin: 4.7 g/dL (ref 3.5–4.8)
Alkaline Phosphatase: 41 IU/L (ref 39–117)
BUN/Creatinine Ratio: 13 (ref 11–26)
BUN: 16 mg/dL (ref 8–27)
CO2: 24 mmol/L (ref 18–29)
Calcium: 9.8 mg/dL (ref 8.6–10.2)
Chloride: 98 mmol/L (ref 97–108)
Creatinine, Ser: 1.23 mg/dL — ABNORMAL HIGH (ref 0.57–1.00)
GFR calc Af Amer: 51 mL/min/{1.73_m2} — ABNORMAL LOW (ref >59)
GFR calc non Af Amer: 44 mL/min/{1.73_m2} — ABNORMAL LOW (ref >59)
Globulin, Total: 1.9 g/dL (ref 1.5–4.5)
Glucose: 85 mg/dL (ref 65–99)
Potassium: 4.2 mmol/L (ref 3.5–5.2)
Sodium: 138 mmol/L (ref 134–144)
Total Bilirubin: 0.4 mg/dL (ref 0.0–1.2)
Total Protein: 6.6 g/dL (ref 6.0–8.5)

## 2012-10-28 ENCOUNTER — Other Ambulatory Visit: Payer: Self-pay | Admitting: Family Medicine

## 2012-10-30 NOTE — Telephone Encounter (Signed)
Prescription renewed in EPIC. 

## 2012-10-30 NOTE — Telephone Encounter (Signed)
Last seen 01/20/12 and last lipids 01/20/12  [facility]

## 2012-11-07 ENCOUNTER — Ambulatory Visit (INDEPENDENT_AMBULATORY_CARE_PROVIDER_SITE_OTHER): Payer: Medicare Other | Admitting: *Deleted

## 2012-11-07 ENCOUNTER — Encounter (INDEPENDENT_AMBULATORY_CARE_PROVIDER_SITE_OTHER): Payer: Self-pay

## 2012-11-07 DIAGNOSIS — Z23 Encounter for immunization: Secondary | ICD-10-CM

## 2012-11-19 ENCOUNTER — Other Ambulatory Visit: Payer: Self-pay | Admitting: Family Medicine

## 2012-11-23 ENCOUNTER — Ambulatory Visit (INDEPENDENT_AMBULATORY_CARE_PROVIDER_SITE_OTHER): Payer: Medicare Other | Admitting: Family Medicine

## 2012-11-23 ENCOUNTER — Encounter: Payer: Self-pay | Admitting: Family Medicine

## 2012-11-23 VITALS — BP 161/71 | HR 71 | Temp 97.7°F | Resp 16 | Ht 61.0 in | Wt 177.0 lb

## 2012-11-23 DIAGNOSIS — J069 Acute upper respiratory infection, unspecified: Secondary | ICD-10-CM

## 2012-11-23 DIAGNOSIS — H109 Unspecified conjunctivitis: Secondary | ICD-10-CM

## 2012-11-23 MED ORDER — AZITHROMYCIN 250 MG PO TABS: ORAL_TABLET | ORAL | Status: DC

## 2012-11-23 MED ORDER — POLYMYXIN B-TRIMETHOPRIM 10000-0.1 UNIT/ML-% OP SOLN: 1.0000 [drp] | OPHTHALMIC | Status: DC

## 2012-11-23 NOTE — Progress Notes (Signed)
  Subjective:    Patient ID: Shirley Brown, female    DOB: 09-06-1940, 72 y.o.   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 No trouble swallowing.  On pilocarpine for post radiation xerostomia  Symptoms Nasal discharge: minimal Fever: no Sore throat: no Cough: minimal Wheezing: no Ear pain: no GI symptoms: no Sick contacts: unsure  Red Flags  Stiff neck: no Dyspnea: no Rash: no Swallowing difficulty: no  Sinusitis Risk Factors Headache/face pain: no Double sickening: no tooth pain: no  Allergy Risk Factors Sneezing: no Itchy scratchy throat: no Seasonal symptoms: no  Flu Risk Factors Headache: no muscle aches: no severe fatigue: no     Review of Systems  All other systems reviewed and are negative.       Objective:   Physical Exam  Constitutional: She appears well-developed and well-nourished.  HENT:  Head: Normocephalic and atraumatic.  Right Ear: External ear normal.  Left Ear: External ear normal.  +nasal erythema, rhinorrhea bilaterally, + post oropharyngeal erythema    Eyes: Conjunctivae are normal. Pupils are equal, round, and reactive to light.  Bilateral eye crusting, no conjunctivitis  Neck: Normal range of motion.  Cardiovascular: Normal rate and regular rhythm.   Pulmonary/Chest: Effort normal.  Abdominal: Soft.  Musculoskeletal: Normal range of motion.  Neurological: She is alert.  Skin: Skin is warm.          Assessment & Plan:  Conjunctivitis - Plan: trimethoprim-polymyxin b (POLYTRIM) ophthalmic solution  URI (upper respiratory infection) - Plan: azithromycin (ZITHROMAX) 250 MG tablet, Enterovirus pcr, Enterovirus DNA probe, amplified, Enterovirus DNA probe, amplified  Suspect overall viral process for symptoms given conjunctival as well as  rhinitis findings (i.e. enterovirus versus adenovirus). We'll place on topical Polytrim for conjunctival coverage. Check enterovirus PCR. 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 No concerning red flags on exam. I suspect that post radiation xerostomia is likely complicating some of the clinical picture. Discussed supportive care and infectious red flags at length with patient. Consider ENT followup if symptoms persist despite treatment.

## 2012-11-26 LAB — SPECIMEN STATUS REPORT

## 2012-11-27 ENCOUNTER — Other Ambulatory Visit: Payer: Self-pay | Admitting: Family Medicine

## 2012-11-28 ENCOUNTER — Other Ambulatory Visit: Payer: Self-pay | Admitting: Family Medicine

## 2012-11-30 ENCOUNTER — Other Ambulatory Visit: Payer: Self-pay | Admitting: Family Medicine

## 2012-12-18 ENCOUNTER — Encounter: Payer: Self-pay | Admitting: Family Medicine

## 2012-12-18 ENCOUNTER — Ambulatory Visit (INDEPENDENT_AMBULATORY_CARE_PROVIDER_SITE_OTHER): Payer: Medicare Other | Admitting: Family Medicine

## 2012-12-18 ENCOUNTER — Other Ambulatory Visit: Payer: Self-pay | Admitting: Family Medicine

## 2012-12-18 VITALS — BP 143/72 | HR 83 | Temp 98.0°F | Ht 61.0 in | Wt 177.8 lb

## 2012-12-18 DIAGNOSIS — R7309 Other abnormal glucose: Secondary | ICD-10-CM

## 2012-12-18 DIAGNOSIS — E785 Hyperlipidemia, unspecified: Secondary | ICD-10-CM

## 2012-12-18 DIAGNOSIS — M1990 Unspecified osteoarthritis, unspecified site: Secondary | ICD-10-CM

## 2012-12-18 DIAGNOSIS — M129 Arthropathy, unspecified: Secondary | ICD-10-CM

## 2012-12-18 DIAGNOSIS — I6529 Occlusion and stenosis of unspecified carotid artery: Secondary | ICD-10-CM

## 2012-12-18 DIAGNOSIS — G4700 Insomnia, unspecified: Secondary | ICD-10-CM

## 2012-12-18 DIAGNOSIS — R739 Hyperglycemia, unspecified: Secondary | ICD-10-CM

## 2012-12-18 DIAGNOSIS — I10 Essential (primary) hypertension: Secondary | ICD-10-CM

## 2012-12-18 NOTE — Patient Instructions (Signed)
      Dr Forrest Jaroszewski's Recommendations  For nutrition information, I recommend books:  1).Eat to Live by Dr Joel Fuhrman. 2).Prevent and Reverse Heart Disease by Dr Caldwell Esselstyn. 3) Dr Neal Barnard's Book:  Program to Reverse Diabetes  Exercise recommendations are:  If unable to walk, then the patient can exercise in a chair 3 times a day. By flapping arms like a bird gently and raising legs outwards to the front.  If ambulatory, the patient can go for walks for 30 minutes 3 times a week. Then increase the intensity and duration as tolerated.  Goal is to try to attain exercise frequency to 5 times a week.  If applicable: Best to perform resistance exercises (machines or weights) 2 days a week and cardio type exercises 3 days per week.  

## 2012-12-18 NOTE — Progress Notes (Addendum)
Patient ID: Shirley Brown, female   DOB: [birth year], 72 y.o.   MRN: [id number] SUBJECTIVE: CC: Chief Complaint  Patient presents with  . Follow-up    2 MONTH FOLLOW UP    HPI: Patient is here for follow up of hyperlipidemia/HTN/Carotid artery stenosis/: denies Headache;denies Chest Pain;denies weakness;denies Shortness of Breath and orthopnea;denies Visual changes;denies palpitations;denies cough;denies pedal edema;denies symptoms of TIA or stroke;deniesClaudication symptoms. admits to Compliance with medications; denies Problems with medications.   Past Medical History  Diagnosis Date  . Carotid artery occlusion 05/11/09    Bruit - Right  . Hyperlipidemia   . Hypertension   . Arthritis   . Allergy to meropenem   . Anemia   . Depression 2011  . Cancer     Oral   Past Surgical History  Procedure Laterality Date  . Cholecystectomy    . Joint replacement Right 1995      Knee  . Joint replacement Left 2001    knee  . Appendectomy     History   Social History  . Marital Status: Divorced    Spouse Name: N/A    Number of Children: N/A  . Years of Education: N/A   Occupational History  . Not on file.   Social History Main Topics  . Smoking status: Former Smoker    Types: Cigarettes    Quit date: 01/24/1993  . Smokeless tobacco: Never Used  . Alcohol Use: No  . Drug Use: No  . Sexual Activity: Not on file   Other Topics Concern  . Not on file   Social History Narrative  . No narrative on file   Family History  Problem Relation Age of Onset  . Heart disease Father   . Heart attack Father   . Heart disease Sister     before age 67  . Hyperlipidemia Sister   . Hypertension Sister   . Other Sister     varicose veins  . Cancer Brother   . Diabetes Son    Current Outpatient Prescriptions on File Prior to Visit  Medication Sig Dispense Refill  . amLODipine (NORVASC) 5 MG tablet TAKE ONE TABLET BY MOUTH ONE TIME DAILY  30 tablet  3  . aspirin 81 MG  tablet Take 81 mg by mouth daily.      . Cholecalciferol (VITAMIN D3) 3000 UNITS TABS Take by mouth.      . Cyanocobalamin (VITAMIN B 12 PO) Take by mouth.      . escitalopram (LEXAPRO) 20 MG tablet TAKE ONE TABLET BY MOUTH ONE TIME DAILY  30 tablet  2  . fenofibrate (TRICOR) 145 MG tablet TAKE ONE TABLET BY MOUTH ONE TIME DAILY  30 tablet  3  . fish oil-omega-3 fatty acids 1000 MG capsule Take 2 g by mouth daily.      . folic acid (FOLVITE) 1 MG tablet TAKE ONE TABLET BY MOUTH ONE TIME DAILY  30 tablet  3  . meloxicam (MOBIC) 15 MG tablet TAKE ONE TABLET BY MOUTH ONE TIME DAILY  30 tablet  2  . pantoprazole (PROTONIX) 40 MG tablet TAKE ONE TABLET BY MOUTH ONE TIME DAILY  30 tablet  4  . pilocarpine (SALAGEN) 5 MG tablet TAKE 1 TABLET BY MOUTH 3 TIMES DAILY AND AT BEDTIME AS NEEDED FOR DRYMOUTH  90 tablet  3  . pravastatin (PRAVACHOL) 10 MG tablet TAKE ONE TABLET BY MOUTH AT BEDTIME  30 tablet  5  . traZODone (DESYREL) 25 mg TABS Take  0.5 tablets (25 mg total) by mouth at bedtime.  15 tablet  3  . vitamin E 200 UNIT capsule Take 200 Units by mouth daily.       No current facility-administered medications on file prior to visit.   Allergies  Allergen Reactions  . Bupropion     Lips swelled  . Desyrel [Trazodone]     Keeps me up all night  . Sulfa Antibiotics    Immunization History  Administered Date(s) Administered  . Influenza,inj,Quad PF,36+ Mos 11/07/2012   Prior to Admission medications   Medication Sig Start Date End Date Taking? Authorizing Provider  amLODipine (NORVASC) 5 MG tablet TAKE ONE TABLET BY MOUTH ONE TIME DAILY 08/25/12   Ileana Ladd, MD  aspirin 81 MG tablet Take 81 mg by mouth daily.    Historical Provider, MD  Cholecalciferol (VITAMIN D3) 3000 UNITS TABS Take by mouth.    Historical Provider, MD  Cyanocobalamin (VITAMIN B 12 PO) Take by mouth.    Historical Provider, MD  escitalopram (LEXAPRO) 20 MG tablet TAKE ONE TABLET BY MOUTH ONE TIME DAILY 11/30/12   Doree Albee, MD  fenofibrate (TRICOR) 145 MG tablet TAKE ONE TABLET BY MOUTH ONE TIME DAILY 06/25/12   Ileana Ladd, MD  fish oil-omega-3 fatty acids 1000 MG capsule Take 2 g by mouth daily.    Historical Provider, MD  folic acid (FOLVITE) 1 MG tablet TAKE ONE TABLET BY MOUTH ONE TIME DAILY 09/20/12   Ileana Ladd, MD  meloxicam (MOBIC) 15 MG tablet TAKE ONE TABLET BY MOUTH ONE TIME DAILY 11/19/12   Ileana Ladd, MD  pantoprazole (PROTONIX) 40 MG tablet TAKE ONE TABLET BY MOUTH ONE TIME DAILY 11/19/12   Ileana Ladd, MD  pilocarpine (SALAGEN) 5 MG tablet TAKE 1 TABLET BY MOUTH 3 TIMES DAILY AND AT BEDTIME AS NEEDED FOR [facility] 06/22/11   Oneita Hurt, MD  pravastatin (PRAVACHOL) 10 MG tablet TAKE ONE TABLET BY MOUTH AT BEDTIME 11/30/12   Doree Albee, MD  traZODone (DESYREL) 25 mg TABS Take 0.5 tablets (25 mg total) by mouth at bedtime. 06/07/12   Ileana Ladd, MD  vitamin E 200 UNIT capsule Take 200 Units by mouth daily.    Historical Provider, MD     ROS: As above in the HPI. All other systems are stable or negative.  OBJECTIVE: APPEARANCE:  Patient in no acute distress.The patient appeared well nourished and normally developed. Acyanotic. Waist: VITAL SIGNS:BP 143/72  Pulse 83  Temp(Src) 98 F (36.7 C) (Oral)  Ht 5\' 1"  (1.549 m)  Wt 177 lb 12.8 oz (80.65 kg)  BMI 33.61 kg/m2 WF Short stature obese  SKIN: warm and  Dry without overt rashes, tattoos and scars  HEAD and Neck: without JVD, Head and scalp: normal Eyes:No scleral icterus. Fundi normal, eye movements normal. Ears: Auricle normal, canal normal, Tympanic membranes normal, insufflation normal. Nose: normal Throat: normal Neck & thyroid: normal  CHEST & LUNGS: Chest wall: normal Lungs: Clear  CVS: Reveals the PMI to be normally located. Regular rhythm, First and Second Heart sounds are normal,  absence of murmurs, rubs or gallops. Peripheral vasculature: Radial pulses: normal Dorsal pedis pulses:  normal Posterior pulses: normal  ABDOMEN:  Appearance: obese Benign, no organomegaly, no masses, no Abdominal Aortic enlargement. No Guarding , no rebound. No Bruits. Bowel sounds: normal  RECTAL: N/A GU: N/A  EXTREMETIES: nonedematous.  NEUROLOGIC: oriented to time,place and person; nonfocal.  Results for orders placed in  visit on 11/23/12  ENTEROVIRUS DNA PROBE, AMPLIFIED      Result Value Range   Enterovirus RT-PCR Negative  Negative  SPECIMEN STATUS REPORT      Result Value Range   specimen status report Comment      ASSESSMENT:  HLD (hyperlipidemia) - Plan: CMP14+EGFR, NMR, lipoprofile  HTN (hypertension) - Plan: CMP14+EGFR  Occlusion and stenosis of carotid artery without mention of cerebral infarction, bilateral - Plan: Carotid duplex  Arthritis  Hyperglycemia  Insomnia  Stable. Patient is due for carotid doppler in January.  PLAN:  Orders Placed This Encounter  Procedures  . CMP14+EGFR  . NMR, lipoprofile  . Carotid duplex    Standing Status: Future     Number of Occurrences:      Standing Expiration Date: 12/18/2013    Scheduling Instructions:     In January 2015 will need a follow up on Bilateral carotid artery stenosis. At [facility].    Order Specific Question:  Laterality    Answer:  Bilateral    Order Specific Question:  Where should this test be performed:    Answer:  Other   No orders of the defined types were placed in this encounter.   Medications Discontinued During This Encounter  Medication Reason  . azithromycin (ZITHROMAX) 250 MG tablet Completed Course  . escitalopram (LEXAPRO) 20 MG tablet Duplicate  . pravastatin (PRAVACHOL) 10 MG tablet Duplicate  . trimethoprim-polymyxin b (POLYTRIM) ophthalmic solution Completed Course       Dr Woodroe Mode Recommendations  For nutrition information, I recommend books:  1).Eat to Live by Dr Monico Hoar. 2).Prevent and Reverse Heart Disease by Dr Suzzette Righter. 3) Dr  Katherina Right Book:  Program to Reverse Diabetes  Exercise recommendations are:  If unable to walk, then the patient can exercise in a chair 3 times a day. By flapping arms like a bird gently and raising legs outwards to the front.  If ambulatory, the patient can go for walks for 30 minutes 3 times a week. Then increase the intensity and duration as tolerated.  Goal is to try to attain exercise frequency to 5 times a week.  If applicable: Best to perform resistance exercises (machines or weights) 2 days a week and cardio type exercises 3 days per week.  Discussed the benefits of a plant based diet  Return in about 3 months (around 03/20/2013).  Shaquan Missey P. [place] Charon, M.D.

## 2012-12-20 LAB — CMP14+EGFR
ALT: 9 IU/L (ref 0–32)
AST: 23 IU/L (ref 0–40)
Albumin/Globulin Ratio: 1.9 (ref 1.1–2.5)
Albumin: 4.3 g/dL (ref 3.5–4.8)
Alkaline Phosphatase: 41 IU/L (ref 39–117)
BUN/Creatinine Ratio: 14 (ref 11–26)
BUN: 18 mg/dL (ref 8–27)
CO2: 29 mmol/L (ref 18–29)
Calcium: 10.3 mg/dL — ABNORMAL HIGH (ref 8.6–10.2)
Chloride: 99 mmol/L (ref 97–108)
Creatinine, Ser: 1.28 mg/dL — ABNORMAL HIGH (ref 0.57–1.00)
GFR calc Af Amer: 49 mL/min/{1.73_m2} — ABNORMAL LOW (ref >59)
GFR calc non Af Amer: 42 mL/min/{1.73_m2} — ABNORMAL LOW (ref >59)
Globulin, Total: 2.3 g/dL (ref 1.5–4.5)
Glucose: 86 mg/dL (ref 65–99)
Potassium: 4.6 mmol/L (ref 3.5–5.2)
Sodium: 138 mmol/L (ref 134–144)
Total Bilirubin: 0.4 mg/dL (ref 0.0–1.2)
Total Protein: 6.6 g/dL (ref 6.0–8.5)

## 2012-12-20 LAB — NMR, LIPOPROFILE
Cholesterol: 130 mg/dL (ref <200)
HDL Cholesterol by NMR: 50 mg/dL (ref >=40)
HDL Particle Number: 47.4 umol/L (ref >=30.5)
LDL Particle Number: 1453 nmol/L — ABNORMAL HIGH (ref <1000)
LDL Size: 21 nm (ref >20.5)
LDLC SERPL CALC-MCNC: 57 mg/dL (ref <100)
LP-IR Score: 75 — ABNORMAL HIGH (ref <=45)
Small LDL Particle Number: 1016 nmol/L — ABNORMAL HIGH (ref <=527)
Triglycerides by NMR: 115 mg/dL (ref <150)

## 2012-12-23 NOTE — Progress Notes (Signed)
Quick Note:  Call Patient Labs abnormal: The creatinine is increasing. This may be due to the meloxicam.  Recommendations: Stop the meloxicam. Use limited amount of tylenol. No more than 4 doses a day. Office visit in 4 weeks to recheck the kidney function and see how well her arthritis pain is controlled.    ______

## 2012-12-31 ENCOUNTER — Other Ambulatory Visit: Payer: Self-pay | Admitting: Family Medicine

## 2013-01-16 ENCOUNTER — Other Ambulatory Visit: Payer: Self-pay | Admitting: Family Medicine

## 2013-01-23 ENCOUNTER — Encounter: Payer: Self-pay | Admitting: Family Medicine

## 2013-01-23 ENCOUNTER — Encounter (INDEPENDENT_AMBULATORY_CARE_PROVIDER_SITE_OTHER): Payer: Self-pay

## 2013-01-23 ENCOUNTER — Ambulatory Visit (INDEPENDENT_AMBULATORY_CARE_PROVIDER_SITE_OTHER): Payer: Medicare Other | Admitting: Family Medicine

## 2013-01-23 VITALS — BP 143/63 | HR 83 | Temp 97.1°F | Ht 61.0 in | Wt 177.2 lb

## 2013-01-23 DIAGNOSIS — I10 Essential (primary) hypertension: Secondary | ICD-10-CM

## 2013-01-23 DIAGNOSIS — R7309 Other abnormal glucose: Secondary | ICD-10-CM

## 2013-01-23 DIAGNOSIS — R070 Pain in throat: Secondary | ICD-10-CM

## 2013-01-23 DIAGNOSIS — G4700 Insomnia, unspecified: Secondary | ICD-10-CM

## 2013-01-23 DIAGNOSIS — I6529 Occlusion and stenosis of unspecified carotid artery: Secondary | ICD-10-CM

## 2013-01-23 DIAGNOSIS — E785 Hyperlipidemia, unspecified: Secondary | ICD-10-CM

## 2013-01-23 DIAGNOSIS — K130 Diseases of lips: Secondary | ICD-10-CM

## 2013-01-23 DIAGNOSIS — M1990 Unspecified osteoarthritis, unspecified site: Secondary | ICD-10-CM

## 2013-01-23 DIAGNOSIS — N183 Chronic kidney disease, stage 3 unspecified: Secondary | ICD-10-CM

## 2013-01-23 DIAGNOSIS — R739 Hyperglycemia, unspecified: Secondary | ICD-10-CM

## 2013-01-23 DIAGNOSIS — M129 Arthropathy, unspecified: Secondary | ICD-10-CM

## 2013-01-23 DIAGNOSIS — C801 Malignant (primary) neoplasm, unspecified: Secondary | ICD-10-CM

## 2013-01-23 MED ORDER — ESCITALOPRAM OXALATE 20 MG PO TABS: ORAL_TABLET | ORAL | Status: DC

## 2013-01-23 MED ORDER — CLOTRIMAZOLE-BETAMETHASONE 1-0.05 % EX CREA: 1.0000 "application " | TOPICAL_CREAM | Freq: Two times a day (BID) | CUTANEOUS | Status: DC

## 2013-01-23 NOTE — Progress Notes (Signed)
Patient ID: Shirley Brown, female   DOB: 1940-12-28, 72 y.o.   MRN: [id number] SUBJECTIVE: CC: Chief Complaint  Patient presents with  . Follow-up    4 week follow up reck arthritis "no better pt did stop meloxicam needs to reck kidney function test wants referral to ENT  . Medication Refill    refill lexapro    HPI:   Sad today because her brother-in-law died. As above.  needs to see the ENT because she has a h/o left tonsillar cancer and the left throat is  Sore in the neck.  Patient is here for follow up of hyperlipidemia/HTN/Carotid artery disease: denies Headache;denies Chest Pain;denies weakness;denies Shortness of Breath and orthopnea;denies Visual changes;denies palpitations;denies cough;denies pedal edema;denies symptoms of TIA or stroke;deniesClaudication symptoms. admits to Compliance with medications; denies Problems with medications.  Needs a refill of the topical cream for the corners of the lips due to cracking and fungus.  Past Medical History  Diagnosis Date  . Carotid artery occlusion 05/11/09    Bruit - Right  . Hyperlipidemia   . Hypertension   . Arthritis   . Allergy to meropenem   . Anemia   . Depression 2011  . Cancer     left tonsillar   Past Surgical History  Procedure Laterality Date  . Cholecystectomy    . Joint replacement Right 1995      Knee  . Joint replacement Left 2001    knee  . Appendectomy     History   Social History  . Marital Status: Divorced    Spouse Name: N/A    Number of Children: N/A  . Years of Education: N/A   Occupational History  . Not on file.   Social History Main Topics  . Smoking status: Former Smoker    Types: Cigarettes    Quit date: 01/24/1993  . Smokeless tobacco: Never Used  . Alcohol Use: No  . Drug Use: No  . Sexual Activity: Not on file   Other Topics Concern  . Not on file   Social History Narrative  . No narrative on file   Family History  Problem Relation Age of Onset  . Heart  disease Father   . Heart attack Father   . Heart disease Sister     before age 51  . Hyperlipidemia Sister   . Hypertension Sister   . Other Sister     varicose veins  . Cancer Brother   . Diabetes Son    Current Outpatient Prescriptions on File Prior to Visit  Medication Sig Dispense Refill  . amLODipine (NORVASC) 5 MG tablet TAKE ONE TABLET BY MOUTH ONE TIME DAILY  30 tablet  5  . aspirin 81 MG tablet Take 81 mg by mouth daily.      . Cholecalciferol (VITAMIN D3) 3000 UNITS TABS Take by mouth.      . Cyanocobalamin (VITAMIN B 12 PO) Take by mouth.      . fenofibrate (TRICOR) 145 MG tablet TAKE ONE TABLET BY MOUTH ONE TIME DAILY  30 tablet  3  . fish oil-omega-3 fatty acids 1000 MG capsule Take 2 g by mouth daily.      . folic acid (FOLVITE) 1 MG tablet TAKE ONE TABLET BY MOUTH ONE TIME DAILY  30 tablet  4  . pantoprazole (PROTONIX) 40 MG tablet TAKE ONE TABLET BY MOUTH ONE TIME DAILY  30 tablet  4  . pilocarpine (SALAGEN) 5 MG tablet TAKE 1 TABLET BY MOUTH 3  TIMES DAILY AND AT BEDTIME AS NEEDED FOR DRYMOUTH  90 tablet  3  . pravastatin (PRAVACHOL) 10 MG tablet TAKE ONE TABLET BY MOUTH AT BEDTIME  30 tablet  5  . traZODone (DESYREL) 25 mg TABS Take 0.5 tablets (25 mg total) by mouth at bedtime.  15 tablet  3  . vitamin E 200 UNIT capsule Take 200 Units by mouth daily.      . meloxicam (MOBIC) 15 MG tablet TAKE ONE TABLET BY MOUTH ONE TIME DAILY  30 tablet  2   No current facility-administered medications on file prior to visit.   Allergies  Allergen Reactions  . Bupropion     Lips swelled  . Desyrel [Trazodone]     Keeps me up all night  . Sulfa Antibiotics    Immunization History  Administered Date(s) Administered  . Influenza,inj,Quad PF,36+ Mos 11/07/2012   Prior to Admission medications   Medication Sig Start Date End Date Taking? Authorizing Provider  amLODipine (NORVASC) 5 MG tablet TAKE ONE TABLET BY MOUTH ONE TIME DAILY 12/18/12  Yes Ileana Ladd, MD  aspirin 81  MG tablet Take 81 mg by mouth daily.   Yes Historical Provider, MD  Cholecalciferol (VITAMIN D3) 3000 UNITS TABS Take by mouth.   Yes Historical Provider, MD  Cyanocobalamin (VITAMIN B 12 PO) Take by mouth.   Yes Historical Provider, MD  escitalopram (LEXAPRO) 20 MG tablet TAKE ONE TABLET BY MOUTH ONE TIME DAILY 11/30/12  Yes Doree Albee, MD  fenofibrate (TRICOR) 145 MG tablet TAKE ONE TABLET BY MOUTH ONE TIME DAILY 06/25/12  Yes Ileana Ladd, MD  fish oil-omega-3 fatty acids 1000 MG capsule Take 2 g by mouth daily.   Yes Historical Provider, MD  folic acid (FOLVITE) 1 MG tablet TAKE ONE TABLET BY MOUTH ONE TIME DAILY 01/16/13  Yes Ileana Ladd, MD  pantoprazole (PROTONIX) 40 MG tablet TAKE ONE TABLET BY MOUTH ONE TIME DAILY 11/19/12  Yes Ileana Ladd, MD  pilocarpine (SALAGEN) 5 MG tablet TAKE 1 TABLET BY MOUTH 3 TIMES DAILY AND AT BEDTIME AS NEEDED FOR [facility] 06/22/11  Yes Oneita Hurt, MD  pravastatin (PRAVACHOL) 10 MG tablet TAKE ONE TABLET BY MOUTH AT BEDTIME 11/30/12  Yes Doree Albee, MD  traZODone (DESYREL) 25 mg TABS Take 0.5 tablets (25 mg total) by mouth at bedtime. 06/07/12  Yes Ileana Ladd, MD  vitamin E 200 UNIT capsule Take 200 Units by mouth daily.   Yes Historical Provider, MD  meloxicam (MOBIC) 15 MG tablet TAKE ONE TABLET BY MOUTH ONE TIME DAILY 11/19/12   Ileana Ladd, MD     ROS: As above in the HPI. All other systems are stable or negative.  OBJECTIVE: APPEARANCE:  Patient in no acute distress.The patient appeared well nourished and normally developed. Acyanotic. Waist: VITAL SIGNS:BP 143/63  Pulse 83  Temp(Src) 97.1 F (36.2 C) (Oral)  Ht 5\' 1"  (1.549 m)  Wt 177 lb 3.2 oz (80.377 kg)  BMI 33.50 kg/m2  Obese WF pleasant  SKIN: warm and  Dry without overt rashes, tattoos and scars  HEAD and Neck: without JVD, Head and scalp: normal Eyes:No scleral icterus. Fundi normal, eye movements normal. Ears: Auricle normal, canal normal, Tympanic  membranes normal, insufflation normal. Nose: normal Throat: normal, no lesions seen. Corners of the lips are moist and cracked Neck & thyroid: normal. No LN palpated. No masses in the neck.  CHEST & LUNGS: Chest wall: normal Lungs: Clear  CVS: Reveals  the PMI to be normally located. Regular rhythm, First and Second Heart sounds are normal,  absence of murmurs, rubs or gallops. Peripheral vasculature: Radial pulses: normal Dorsal pedis pulses: normal Posterior pulses: normal  ABDOMEN:  Appearance: Obese Benign, no organomegaly, no masses, no Abdominal Aortic enlargement. No Guarding , no rebound. No Bruits. Bowel sounds: normal  RECTAL: N/A GU: N/A  EXTREMETIES: nonedematous.   NEUROLOGIC: oriented to time,place and person; nonfocal.   ASSESSMENT: Cheilitis - Plan: clotrimazole-betamethasone (LOTRISONE) cream  CKD (chronic kidney disease) stage 3, GFR 30-59 ml/min - Plan: CMP14+EGFR  Throat pain - Plan: Ambulatory referral to ENT  Cancer  HLD (hyperlipidemia)  HTN (hypertension)  Occlusion and stenosis of carotid artery without mention of cerebral infarction, unspecified laterality  Arthritis  Hyperglycemia  Insomnia Patient also grieving at the loss of her brother-in- law. Obesity  PLAN:  Supportive therapy. Agreed with patient need for ENT evaluation. Smoking cessation counseled Healthy lifestyle.  lexapro Rx  Refilled the topical for treatment of her cheilitis.   Orders Placed This Encounter  Procedures  . CMP14+EGFR  . Ambulatory referral to ENT    Referral Priority:  Routine    Referral Type:  Consultation    Referral Reason:  Specialty Services Required    Requested Specialty:  Otolaryngology    Number of Visits Requested:  1   Meds ordered this encounter  Medications  . clotrimazole-betamethasone (LOTRISONE) cream    Sig: Apply 1 application topically 2 (two) times daily.    Dispense:  30 g    Refill:  0  . escitalopram  (LEXAPRO) 20 MG tablet    Sig: TAKE ONE TABLET BY MOUTH ONE TIME DAILY    Dispense:  30 tablet    Refill:  11   Medications Discontinued During This Encounter  Medication Reason  . escitalopram (LEXAPRO) 20 MG tablet Reorder   Return if symptoms worsen or fail to improve, for keep scheduled appointment.  Jahmad Petrich P. [place] Charon, M.D.

## 2013-01-24 ENCOUNTER — Encounter: Payer: Self-pay | Admitting: Family Medicine

## 2013-01-24 DIAGNOSIS — C801 Malignant (primary) neoplasm, unspecified: Secondary | ICD-10-CM | POA: Insufficient documentation

## 2013-01-24 LAB — CMP14+EGFR
ALT: 14 IU/L (ref 0–32)
AST: 27 IU/L (ref 0–40)
Albumin/Globulin Ratio: 2.1 (ref 1.1–2.5)
Albumin: 4.5 g/dL (ref 3.5–4.8)
Alkaline Phosphatase: 41 IU/L (ref 39–117)
BUN/Creatinine Ratio: 14 (ref 11–26)
BUN: 14 mg/dL (ref 8–27)
CO2: 25 mmol/L (ref 18–29)
Calcium: 10.3 mg/dL — ABNORMAL HIGH (ref 8.6–10.2)
Chloride: 101 mmol/L (ref 97–108)
Creatinine, Ser: 0.97 mg/dL (ref 0.57–1.00)
GFR calc Af Amer: 68 mL/min/{1.73_m2} (ref >59)
GFR calc non Af Amer: 59 mL/min/{1.73_m2} — ABNORMAL LOW (ref >59)
Globulin, Total: 2.1 g/dL (ref 1.5–4.5)
Glucose: 88 mg/dL (ref 65–99)
Potassium: 4.6 mmol/L (ref 3.5–5.2)
Sodium: 142 mmol/L (ref 134–144)
Total Bilirubin: 0.3 mg/dL (ref 0.0–1.2)
Total Protein: 6.6 g/dL (ref 6.0–8.5)

## 2013-01-29 ENCOUNTER — Other Ambulatory Visit: Payer: Self-pay | Admitting: Family Medicine

## 2013-01-29 ENCOUNTER — Other Ambulatory Visit: Payer: Self-pay | Admitting: Radiation Oncology

## 2013-02-05 ENCOUNTER — Other Ambulatory Visit: Payer: Self-pay | Admitting: Family Medicine

## 2013-03-12 ENCOUNTER — Ambulatory Visit: Payer: Medicare Other | Admitting: Family

## 2013-03-12 ENCOUNTER — Other Ambulatory Visit (HOSPITAL_COMMUNITY): Payer: Medicare Other

## 2013-03-21 ENCOUNTER — Ambulatory Visit: Payer: Medicare Other | Admitting: Family Medicine

## 2013-03-27 ENCOUNTER — Encounter: Payer: Self-pay | Admitting: Family

## 2013-03-28 ENCOUNTER — Ambulatory Visit (INDEPENDENT_AMBULATORY_CARE_PROVIDER_SITE_OTHER): Payer: Medicare Other | Admitting: Family

## 2013-03-28 ENCOUNTER — Encounter: Payer: Self-pay | Admitting: Family

## 2013-03-28 ENCOUNTER — Ambulatory Visit (HOSPITAL_COMMUNITY)
Admission: RE | Admit: 2013-03-28 | Discharge: 2013-03-28 | Disposition: A | Discharge status: Home / Self Care | Payer: Medicare Other | Source: Ambulatory Visit | Attending: Family | Admitting: Family

## 2013-03-28 VITALS — BP 138/78 | HR 73 | Resp 16 | Ht 60.0 in | Wt 184.7 lb

## 2013-03-28 DIAGNOSIS — I6529 Occlusion and stenosis of unspecified carotid artery: Secondary | ICD-10-CM | POA: Insufficient documentation

## 2013-03-28 NOTE — Progress Notes (Signed)
Established Carotid Patient   History of Present Illness  Shirley Brown is a 73 y.o. female patient if Dr. Donnetta Hutching follow for moderate bilateral ICA stenosis.  She returns today for 1 year follow up and surveillance. 02/02/2012 carotid artery [place] at [facility] revealed a 50-69% stenoses bilaterally with calcified plaque.  Patient has not had previous carotid artery intervention.  Patient has Negative history of TIA or stroke symptom.  The patient denies amaurosis fugax or monocular blindness.  The patient  denies facial drooping.  Pt. denies hemiplegia.  The patient denies receptive or expressive aphasia.  Pt. denies extremity weakness. She denies claudication symptoms in legs but does report lumbar spine issues and some OA pain in both knees.  Patient denies New Medical or Surgical History  Pt Diabetic: No Pt smoker: former smoker, quit in 1995  Pt meds include: Statin : Yes ASA: Yes Other anticoagulants/antiplatelets: no   Past Medical History  Diagnosis Date  . Carotid artery occlusion 05/11/09    Bruit - Right  . Hyperlipidemia   . Hypertension   . Arthritis   . Allergy to meropenem   . Anemia   . Depression 2011  . Cancer     left tonsillar    Social History History  Substance Use Topics  . Smoking status: Former Smoker    Types: Cigarettes    Quit date: 01/24/1993  . Smokeless tobacco: Never Used  . Alcohol Use: No    Family History Family History  Problem Relation Age of Onset  . Heart disease Father   . Heart attack Father   . Hyperlipidemia Father   . Hypertension Father   . Heart disease Sister     before age 75  . Hyperlipidemia Sister   . Hypertension Sister   . Other Sister     varicose veins  . Cancer Brother     Throat and Lung  . Diabetes Son   . Hyperlipidemia Son     Surgical History Past Surgical History  Procedure Laterality Date  . Cholecystectomy    . Joint replacement Right 1995      Knee  . Joint replacement Left  2001    knee  . Appendectomy      Allergies  Allergen Reactions  . Bupropion Other (See Comments)    Lips swelled  . Sulfa Antibiotics Itching and Rash  . Desyrel [Trazodone] Other (See Comments)    Keeps me up all night    Current Outpatient Prescriptions  Medication Sig Dispense Refill  . amLODipine (NORVASC) 5 MG tablet TAKE ONE TABLET BY MOUTH ONE TIME DAILY  30 tablet  5  . aspirin 81 MG tablet Take 81 mg by mouth daily.      . Cholecalciferol (VITAMIN D3) 3000 UNITS TABS Take by mouth.      . clotrimazole-betamethasone (LOTRISONE) cream Apply 1 application topically 2 (two) times daily.  30 g  0  . Cyanocobalamin (VITAMIN B 12 PO) Take by mouth.      . escitalopram (LEXAPRO) 20 MG tablet TAKE ONE TABLET BY MOUTH ONE TIME DAILY  30 tablet  11  . fenofibrate (TRICOR) 145 MG tablet TAKE ONE TABLET BY MOUTH ONE TIME DAILY  30 tablet  3  . fish oil-omega-3 fatty acids 1000 MG capsule Take 1,200 mg by mouth daily.       . folic acid (FOLVITE) 1 MG tablet TAKE ONE TABLET BY MOUTH ONE TIME DAILY  30 tablet  4  . pantoprazole (  PROTONIX) 40 MG tablet TAKE ONE TABLET BY MOUTH ONE TIME DAILY  30 tablet  4  . pravastatin (PRAVACHOL) 10 MG tablet TAKE ONE TABLET BY MOUTH AT BEDTIME  30 tablet  5  . vitamin E 200 UNIT capsule Take 400 Units by mouth daily.       . meloxicam (MOBIC) 15 MG tablet TAKE ONE TABLET BY MOUTH ONE TIME DAILY  30 tablet  2  . pilocarpine (SALAGEN) 5 MG tablet TAKE 1 TABLET BY MOUTH 3 TIMES DAILY AND AT BEDTIME AS NEEDED FOR DRYMOUTH  90 tablet  4  . traZODone (DESYREL) 25 mg TABS Take 0.5 tablets (25 mg total) by mouth at bedtime.  15 tablet  3   No current facility-administered medications for this visit.    Review of Systems : See HPI for pertinent positives and negatives.  Physical Examination  Filed Vitals:   03/28/13 1441  BP: 138/78  Pulse: 73  Resp: 16   Filed Weights   03/28/13 1441  Weight: 184 lb 11.2 oz (83.779 kg)   Body mass index is 36.07  kg/(m^2).  General: WDWN obese female in NAD GAIT: normal Eyes: PERRLA Pulmonary:  Non-labored, CTAB, Negative  Rales, Negative rhonchi, & Negative wheezing.  Cardiac: regular Rhythm ,  Negative detected murmur.  VASCULAR EXAM Carotid Bruits Left Right   Negative Positive   Radial pulses are 2+ palpable and equal.                                                                                                                            LE Pulses LEFT RIGHT       POPLITEAL  not palpable   not palpable       POSTERIOR TIBIAL  not palpable   not palpable        DORSALIS PEDIS      ANTERIOR TIBIAL  palpable   palpable     Gastrointestinal: soft, nontender, BS WNL, no r/g,  negative masses.  Musculoskeletal: Negative muscle atrophy/wasting. M/S 5/5 throughout, Extremities without ischemic changes.  Neurologic: A&O X 3; Appropriate Affect ; SENSATION ;normal;  Speech is normal CN 2-12 intact, Pain and light touch intact in extremities, Motor exam as listed above.   Non-Invasive Vascular Imaging CAROTID DUPLEX 03/28/2013   CEREBROVASCULAR DUPLEX EVALUATION    INDICATION: Carotid artery disease     PREVIOUS INTERVENTION(S):     DUPLEX EXAM:     RIGHT  LEFT  Peak Systolic Velocities (cm/s) End Diastolic Velocities (cm/s) Plaque LOCATION Peak Systolic Velocities (cm/s) End Diastolic Velocities (cm/s) Plaque  82 17  CCA PROXIMAL 107 27   71 18  CCA MID 129 42   73 23  CCA DISTAL 149 42   120 18  ECA 84 7   112 30 HT ICA PROXIMAL 118 34 HT  127 41  ICA MID 130 32   106 29  ICA DISTAL 92 34     1.78 ICA /  CCA Ratio (PSV) 1.00  Antegrade  Vertebral Flow Antegrade    Brachial Systolic Pressure (mmHg)    Brachial Artery Waveforms     Plaque Morphology:  HM = Homogeneous, HT = Heterogeneous, CP = Calcific Plaque, SP = Smooth Plaque, IP = Irregular Plaque     ADDITIONAL FINDINGS:     IMPRESSION: Right internal carotid artery velocities are suggestive of a 40-59%  stenosis.  Left internal carotid artery velocities suggest a <40% stenosis (high end of range).    Compared to the previous exam:  No prior exam performed at this facility for comparison.    02/02/2012 carotid artery [place] at [facility] revealed a 50-69% stenoses bilaterally with calcified plaque.   Assessment: Shirley Brown is a 72 y.o. female who presents with asymptomatic mild/moderate right ICA stenosis and minimal left ICA stenosis. The  ICA stenosis is  Improved from previous exam. Her atherosclerotic risk factors are obesity and inadequate moderate exercise.  Plan:  Discussed 30 minutes walking daily at last 5 days/week. Follow-up in 1 year with Carotid Duplex scan.   I discussed in depth with the patient the nature of atherosclerosis, and emphasized the importance of maximal medical management including strict control of blood pressure, blood glucose, and lipid levels, obtaining regular exercise, and continuedcessation of smoking.  The patient is aware that without maximal medical management the underlying atherosclerotic disease process will progress, limiting the benefit of any interventions. The patient was given information about stroke prevention and what symptoms should prompt the patient to seek immediate medical care. Thank you for allowing [place] to participate in this patient's care.  Clemon Chambers, RN, MSN, FNP-C [facility] Office: [facility] Physician: Oneida Alar  03/28/2013 2:45 PM

## 2013-03-28 NOTE — Patient Instructions (Signed)

## 2013-04-04 ENCOUNTER — Other Ambulatory Visit: Payer: Self-pay | Admitting: *Deleted

## 2013-04-04 DIAGNOSIS — I6529 Occlusion and stenosis of unspecified carotid artery: Secondary | ICD-10-CM

## 2013-04-10 ENCOUNTER — Other Ambulatory Visit: Payer: Self-pay | Admitting: *Deleted

## 2013-04-10 MED ORDER — ACYCLOVIR 400 MG PO TABS: ORAL_TABLET | ORAL | Status: DC

## 2013-04-10 NOTE — Telephone Encounter (Signed)
Call patient : Prescription refilled & sent to pharmacy in [facility]. 

## 2013-04-10 NOTE — Telephone Encounter (Signed)
The medication was not on pt list and last refill was 09/23/11. Please review.

## 2013-04-18 ENCOUNTER — Encounter: Payer: Self-pay | Admitting: Family Medicine

## 2013-04-18 ENCOUNTER — Ambulatory Visit (INDEPENDENT_AMBULATORY_CARE_PROVIDER_SITE_OTHER): Payer: Medicare Other | Admitting: Family Medicine

## 2013-04-18 VITALS — BP 130/65 | HR 83 | Temp 97.9°F | Ht 60.0 in | Wt 183.2 lb

## 2013-04-18 DIAGNOSIS — I10 Essential (primary) hypertension: Secondary | ICD-10-CM

## 2013-04-18 DIAGNOSIS — M129 Arthropathy, unspecified: Secondary | ICD-10-CM

## 2013-04-18 DIAGNOSIS — M1990 Unspecified osteoarthritis, unspecified site: Secondary | ICD-10-CM

## 2013-04-18 DIAGNOSIS — R739 Hyperglycemia, unspecified: Secondary | ICD-10-CM

## 2013-04-18 DIAGNOSIS — R7309 Other abnormal glucose: Secondary | ICD-10-CM

## 2013-04-18 DIAGNOSIS — E785 Hyperlipidemia, unspecified: Secondary | ICD-10-CM

## 2013-04-18 DIAGNOSIS — C801 Malignant (primary) neoplasm, unspecified: Secondary | ICD-10-CM

## 2013-04-18 DIAGNOSIS — G4700 Insomnia, unspecified: Secondary | ICD-10-CM

## 2013-04-18 DIAGNOSIS — I6529 Occlusion and stenosis of unspecified carotid artery: Secondary | ICD-10-CM

## 2013-04-18 MED ORDER — ZOLPIDEM TARTRATE 5 MG PO TABS: 5.0000 mg | ORAL_TABLET | Freq: Every evening | ORAL | Status: DC | PRN

## 2013-04-18 NOTE — Patient Instructions (Signed)
      Dr Jeyli Zwicker's Recommendations  For nutrition information, I recommend books:  1).Eat to Live by Dr Joel Fuhrman. 2).Prevent and Reverse Heart Disease by Dr Caldwell Esselstyn. 3) Dr Neal Barnard's Book:  Program to Reverse Diabetes  Exercise recommendations are:  If unable to walk, then the patient can exercise in a chair 3 times a day. By flapping arms like a bird gently and raising legs outwards to the front.  If ambulatory, the patient can go for walks for 30 minutes 3 times a week. Then increase the intensity and duration as tolerated.  Goal is to try to attain exercise frequency to 5 times a week.  If applicable: Best to perform resistance exercises (machines or weights) 2 days a week and cardio type exercises 3 days per week.  

## 2013-04-18 NOTE — Progress Notes (Signed)
Patient ID: Shirley Brown, female   DOB: 18-Apr-1940, 73 y.o.   MRN: [id number] SUBJECTIVE: CC: Chief Complaint  Patient presents with  . Follow-up    3 month follow up [place] ZOLPIDEM    HPI: Patient is here for follow up of hyperlipidemia/HTN/arthritis/carotid artery disease: denies Headache;denies Chest Pain;denies weakness;denies Shortness of Breath and orthopnea;denies Visual changes;denies palpitations;denies cough;denies pedal edema;denies symptoms of TIA or stroke;deniesClaudication symptoms. admits to Compliance with medications; denies Problems with medications.  Had carotid U/S: awaiting results.  Problem sleeping at night. Trazodone  Didn't help. Zolpidem worked best.   Past Medical History  Diagnosis Date  . Carotid artery occlusion 05/11/09    Bruit - Right  . Hyperlipidemia   . Hypertension   . Arthritis   . Allergy to meropenem   . Anemia   . Depression 2011  . Cancer     left tonsillar   Past Surgical History  Procedure Laterality Date  . Cholecystectomy    . Joint replacement Right 1995      Knee  . Joint replacement Left 2001    knee  . Appendectomy     History   Social History  . Marital Status: Divorced    Spouse Name: N/A    Number of Children: N/A  . Years of Education: N/A   Occupational History  . Not on file.   Social History Main Topics  . Smoking status: Former Smoker    Types: Cigarettes    Quit date: 01/24/1993  . Smokeless tobacco: Never Used  . Alcohol Use: No  . Drug Use: No  . Sexual Activity: Not on file   Other Topics Concern  . Not on file   Social History Narrative  . No narrative on file   Family History  Problem Relation Age of Onset  . Heart disease Father   . Heart attack Father   . Hyperlipidemia Father   . Hypertension Father   . Heart disease Sister     before age 39  . Hyperlipidemia Sister   . Hypertension Sister   . Other Sister     varicose veins  . Cancer Brother     Throat  and Lung  . Diabetes Son   . Hyperlipidemia Son    Current Outpatient Prescriptions on File Prior to Visit  Medication Sig Dispense Refill  . acyclovir (ZOVIRAX) 400 MG tablet Take one tablet by mouth one time daily  30 tablet  0  . amLODipine (NORVASC) 5 MG tablet TAKE ONE TABLET BY MOUTH ONE TIME DAILY  30 tablet  5  . aspirin 81 MG tablet Take 81 mg by mouth daily.      . Cholecalciferol (VITAMIN D3) 3000 UNITS TABS Take by mouth.      . clotrimazole-betamethasone (LOTRISONE) cream Apply 1 application topically 2 (two) times daily.  30 g  0  . Cyanocobalamin (VITAMIN B 12 PO) Take by mouth.      . escitalopram (LEXAPRO) 20 MG tablet TAKE ONE TABLET BY MOUTH ONE TIME DAILY  30 tablet  11  . fenofibrate (TRICOR) 145 MG tablet TAKE ONE TABLET BY MOUTH ONE TIME DAILY  30 tablet  3  . fish oil-omega-3 fatty acids 1000 MG capsule Take 1,200 mg by mouth daily.       . folic acid (FOLVITE) 1 MG tablet TAKE ONE TABLET BY MOUTH ONE TIME DAILY  30 tablet  4  . meloxicam (MOBIC) 15 MG tablet TAKE ONE  TABLET BY MOUTH ONE TIME DAILY  30 tablet  2  . pilocarpine (SALAGEN) 5 MG tablet TAKE 1 TABLET BY MOUTH 3 TIMES DAILY AND AT BEDTIME AS NEEDED FOR DRYMOUTH  90 tablet  4  . pravastatin (PRAVACHOL) 10 MG tablet TAKE ONE TABLET BY MOUTH AT BEDTIME  30 tablet  5  . vitamin E 200 UNIT capsule Take 400 Units by mouth daily.        No current facility-administered medications on file prior to visit.   Allergies  Allergen Reactions  . Bupropion Other (See Comments)    Lips swelled  . Sulfa Antibiotics Itching and Rash  . Desyrel [Trazodone] Other (See Comments)    Keeps me up all night   Immunization History  Administered Date(s) Administered  . Influenza,inj,Quad PF,36+ Mos 11/07/2012   Prior to Admission medications   Medication Sig Start Date End Date Taking? Authorizing Provider  acyclovir (ZOVIRAX) 400 MG tablet Take one tablet by mouth one time daily 04/10/13  Yes Vernie Shanks, MD   amLODipine (NORVASC) 5 MG tablet TAKE ONE TABLET BY MOUTH ONE TIME DAILY 12/18/12  Yes Vernie Shanks, MD  aspirin 81 MG tablet Take 81 mg by mouth daily.   Yes Historical Provider, MD  Cholecalciferol (VITAMIN D3) 3000 UNITS TABS Take by mouth.   Yes Historical Provider, MD  clotrimazole-betamethasone (LOTRISONE) cream Apply 1 application topically 2 (two) times daily. 01/23/13  Yes Vernie Shanks, MD  Cyanocobalamin (VITAMIN B 12 PO) Take by mouth.   Yes Historical Provider, MD  escitalopram (LEXAPRO) 20 MG tablet TAKE ONE TABLET BY MOUTH ONE TIME DAILY 01/23/13  Yes Vernie Shanks, MD  fenofibrate (TRICOR) 145 MG tablet TAKE ONE TABLET BY MOUTH ONE TIME DAILY 02/05/13  Yes Vernie Shanks, MD  fish oil-omega-3 fatty acids 1000 MG capsule Take 1,200 mg by mouth daily.    Yes Historical Provider, MD  folic acid (FOLVITE) 1 MG tablet TAKE ONE TABLET BY MOUTH ONE TIME DAILY 01/16/13  Yes Vernie Shanks, MD  meloxicam (MOBIC) 15 MG tablet TAKE ONE TABLET BY MOUTH ONE TIME DAILY 11/19/12  Yes Vernie Shanks, MD  pantoprazole (PROTONIX) 40 MG tablet TAKE ONE TABLET BY MOUTH TWO TIME DAILY 11/19/12  Yes Vernie Shanks, MD  pilocarpine (SALAGEN) 5 MG tablet TAKE 1 TABLET BY MOUTH 3 TIMES DAILY AND AT BEDTIME AS NEEDED FOR [facility] 01/29/13  Yes Lora Paula, MD  pravastatin (PRAVACHOL) 10 MG tablet TAKE ONE TABLET BY MOUTH AT BEDTIME 11/30/12  Yes Shanda Howells, MD  traZODone (DESYREL) 25 mg TABS Take 0.5 tablets (25 mg total) by mouth at bedtime. 06/07/12  Yes Vernie Shanks, MD  vitamin E 200 UNIT capsule Take 400 Units by mouth daily.    Yes Historical Provider, MD     ROS: As above in the HPI. All other systems are stable or negative.  OBJECTIVE: APPEARANCE:  Patient in no acute distress.The patient appeared well nourished and normally developed. Acyanotic. Waist: VITAL SIGNS:BP 130/65  Pulse 83  Temp(Src) 97.9 F (36.6 C) (Oral)  Ht 5' (1.524 m)  Wt 183 lb 3.2 oz (83.099 kg)  BMI  35.78 kg/m2 Short stature  WF obese  SKIN: warm and  Dry without overt rashes, tattoos and scars  HEAD and Neck: without JVD, Head and scalp: normal Eyes:No scleral icterus. Fundi normal, eye movements normal. Ears: Auricle normal, canal normal, Tympanic membranes normal, insufflation normal. Nose: normal Throat: normal Neck & thyroid:  normal  CHEST & LUNGS: Chest wall: normal Lungs: Clear  CVS: Reveals the PMI to be normally located. Regular rhythm, First and Second Heart sounds are normal,  absence of murmurs, rubs or gallops. Peripheral vasculature: Radial pulses: normal Dorsal pedis pulses: normal Posterior pulses: normal  ABDOMEN:  Appearance: Obese Benign, no organomegaly, no masses, no Abdominal Aortic enlargement. No Guarding , no rebound. No Bruits. Bowel sounds: normal  RECTAL: N/A GU: N/A  EXTREMETIES: nonedematous.  MUSCULOSKELETAL:  Spine: decreased ROM of the spine Joints: intact  NEUROLOGIC: oriented to time,place and person; nonfocal. Cranial Nerves are normal.  Results for orders placed in visit on 01/23/13  CMP14+EGFR      Result Value Ref Range   Glucose 88  65 - 99 mg/dL   BUN 14  8 - 27 mg/dL   Creatinine, Ser 0.97  0.57 - 1.00 mg/dL   GFR calc non Af Amer 59 (*) >59 mL/min/1.73   GFR calc Af Amer 68  >59 mL/min/1.73   BUN/Creatinine Ratio 14  11 - 26   Sodium 142  134 - 144 mmol/L   Potassium 4.6  3.5 - 5.2 mmol/L   Chloride 101  97 - 108 mmol/L   CO2 25  18 - 29 mmol/L   Calcium 10.3 (*) 8.6 - 10.2 mg/dL   Total Protein 6.6  6.0 - 8.5 g/dL   Albumin 4.5  3.5 - 4.8 g/dL   Globulin, Total 2.1  1.5 - 4.5 g/dL   Albumin/Globulin Ratio 2.1  1.1 - 2.5   Total Bilirubin 0.3  0.0 - 1.2 mg/dL   Alkaline Phosphatase 41  39 - 117 IU/L   AST 27  0 - 40 IU/L   ALT 14  0 - 32 IU/L    ASSESSMENT:   Occlusion and stenosis of carotid artery without mention of cerebral infarction  HTN (hypertension) - Plan: CMP14+EGFR  HLD  (hyperlipidemia) - Plan: CMP14+EGFR, NMR, lipoprofile  Cancer  Insomnia - Plan: zolpidem (AMBIEN) 5 MG tablet  Hyperglycemia  Arthritis  PLAN:      Dr Paula Libra Recommendations  For nutrition information, I recommend books:  1).Eat to Live by Dr Excell Seltzer. 2).Prevent and Reverse Heart Disease by Dr Karl Luke. 3) Dr Janene Harvey Book:  Program to Reverse Diabetes  Exercise recommendations are:  If unable to walk, then the patient can exercise in a chair 3 times a day. By flapping arms like a bird gently and raising legs outwards to the front.  If ambulatory, the patient can go for walks for 30 minutes 3 times a week. Then increase the intensity and duration as tolerated.  Goal is to try to attain exercise frequency to 5 times a week.  If applicable: Best to perform resistance exercises (machines or weights) 2 days a week and cardio type exercises 3 days per week.  Orders Placed This Encounter  Procedures  . CMP14+EGFR  . NMR, lipoprofile   Meds ordered this encounter  Medications  . pantoprazole (PROTONIX) 40 MG tablet    Sig: TAKE ONE TABLET BY MOUTH TWO TIME DAILY  . zolpidem (AMBIEN) 5 MG tablet    Sig: Take 1 tablet (5 mg total) by mouth at bedtime as needed for sleep.    Dispense:  30 tablet    Refill:  1   Medications Discontinued During This Encounter  Medication Reason  . pantoprazole (PROTONIX) 40 MG tablet   . traZODone (DESYREL) 25 mg TABS Formulary change   Return in about  3 months (around 07/19/2013) for Recheck medical problems.  Haru Anspaugh P. Jacelyn Grip, M.D.

## 2013-04-20 ENCOUNTER — Other Ambulatory Visit: Payer: Self-pay | Admitting: Family Medicine

## 2013-04-20 DIAGNOSIS — E785 Hyperlipidemia, unspecified: Secondary | ICD-10-CM

## 2013-04-20 LAB — CMP14+EGFR
ALT: 7 IU/L (ref 0–32)
AST: 24 IU/L (ref 0–40)
Albumin/Globulin Ratio: 2 (ref 1.1–2.5)
Albumin: 4.4 g/dL (ref 3.5–4.8)
Alkaline Phosphatase: 42 IU/L (ref 39–117)
BUN/Creatinine Ratio: 13 (ref 11–26)
BUN: 14 mg/dL (ref 8–27)
CO2: 24 mmol/L (ref 18–29)
Calcium: 9.5 mg/dL (ref 8.7–10.3)
Chloride: 101 mmol/L (ref 97–108)
Creatinine, Ser: 1.12 mg/dL — ABNORMAL HIGH (ref 0.57–1.00)
GFR calc Af Amer: 57 mL/min/{1.73_m2} — ABNORMAL LOW (ref >59)
GFR calc non Af Amer: 50 mL/min/{1.73_m2} — ABNORMAL LOW (ref >59)
Globulin, Total: 2.2 g/dL (ref 1.5–4.5)
Glucose: 120 mg/dL — ABNORMAL HIGH (ref 65–99)
Potassium: 3.9 mmol/L (ref 3.5–5.2)
Sodium: 141 mmol/L (ref 134–144)
Total Bilirubin: 0.4 mg/dL (ref 0.0–1.2)
Total Protein: 6.6 g/dL (ref 6.0–8.5)

## 2013-04-20 LAB — NMR, LIPOPROFILE
Cholesterol: 162 mg/dL (ref <200)
HDL Cholesterol by NMR: 59 mg/dL (ref >=40)
HDL Particle Number: 44.1 umol/L (ref >=30.5)
LDL Particle Number: 1103 nmol/L — ABNORMAL HIGH (ref <1000)
LDL Size: 21.1 nm (ref >20.5)
LDLC SERPL CALC-MCNC: 86 mg/dL (ref <100)
LP-IR Score: 40 (ref <=45)
Small LDL Particle Number: 642 nmol/L — ABNORMAL HIGH (ref <=527)
Triglycerides by NMR: 87 mg/dL (ref <150)

## 2013-04-20 MED ORDER — PRAVASTATIN SODIUM 20 MG PO TABS: ORAL_TABLET | ORAL | Status: DC

## 2013-06-18 ENCOUNTER — Other Ambulatory Visit: Payer: Self-pay | Admitting: *Deleted

## 2013-06-18 DIAGNOSIS — G4700 Insomnia, unspecified: Secondary | ICD-10-CM

## 2013-06-18 MED ORDER — ZOLPIDEM TARTRATE 5 MG PO TABS: 5.0000 mg | ORAL_TABLET | Freq: Every evening | ORAL | Status: DC | PRN

## 2013-06-18 NOTE — Telephone Encounter (Signed)
Last seen by wong 04/18/13, last filled 05/17/13. Call into [facility]

## 2013-06-18 NOTE — Telephone Encounter (Signed)
Called in.

## 2013-06-18 NOTE — Telephone Encounter (Signed)
rx ready for pickup 

## 2013-06-21 ENCOUNTER — Other Ambulatory Visit: Payer: Self-pay

## 2013-06-21 MED ORDER — AMLODIPINE BESYLATE 5 MG PO TABS: ORAL_TABLET | ORAL | Status: DC

## 2013-06-21 MED ORDER — FENOFIBRATE 145 MG PO TABS: ORAL_TABLET | ORAL | Status: DC

## 2013-06-21 MED ORDER — FOLIC ACID 1 MG PO TABS: ORAL_TABLET | ORAL | Status: DC

## 2013-07-17 ENCOUNTER — Other Ambulatory Visit: Payer: Self-pay | Admitting: Nurse Practitioner

## 2013-07-18 NOTE — Telephone Encounter (Signed)
Please call in Shirley Brown with 1 refills Patient NTBS for follow up and lab work

## 2013-07-18 NOTE — Telephone Encounter (Signed)
Patient of Dr Jacelyn Grip. Last seen in office on 04-18-13. Rx last filled on 06-18-13. Please advise. If approved please route to pool B so nurse can phone in to pharmacy

## 2013-07-18 NOTE — Telephone Encounter (Signed)
rx called in and patient aware ntbs

## 2013-07-22 ENCOUNTER — Ambulatory Visit (INDEPENDENT_AMBULATORY_CARE_PROVIDER_SITE_OTHER): Payer: Medicare Other | Admitting: Family

## 2013-07-22 ENCOUNTER — Encounter: Payer: Self-pay | Admitting: Family

## 2013-07-22 VITALS — BP 130/69 | HR 79 | Temp 99.6°F | Ht 60.0 in | Wt 180.0 lb

## 2013-07-22 DIAGNOSIS — K130 Diseases of lips: Secondary | ICD-10-CM

## 2013-07-22 DIAGNOSIS — I151 Hypertension secondary to other renal disorders: Secondary | ICD-10-CM

## 2013-07-22 DIAGNOSIS — E559 Vitamin D deficiency, unspecified: Secondary | ICD-10-CM

## 2013-07-22 DIAGNOSIS — F411 Generalized anxiety disorder: Secondary | ICD-10-CM | POA: Insufficient documentation

## 2013-07-22 DIAGNOSIS — G4700 Insomnia, unspecified: Secondary | ICD-10-CM

## 2013-07-22 DIAGNOSIS — E785 Hyperlipidemia, unspecified: Secondary | ICD-10-CM

## 2013-07-22 DIAGNOSIS — I150 Renovascular hypertension: Secondary | ICD-10-CM

## 2013-07-22 DIAGNOSIS — N2889 Other specified disorders of kidney and ureter: Principal | ICD-10-CM

## 2013-07-22 DIAGNOSIS — K219 Gastro-esophageal reflux disease without esophagitis: Secondary | ICD-10-CM | POA: Insufficient documentation

## 2013-07-22 MED ORDER — ZOLPIDEM TARTRATE 5 MG PO TABS: ORAL_TABLET | ORAL | Status: DC

## 2013-07-22 MED ORDER — CLOTRIMAZOLE-BETAMETHASONE 1-0.05 % EX CREA: 1.0000 "application " | TOPICAL_CREAM | Freq: Two times a day (BID) | CUTANEOUS | Status: DC

## 2013-07-22 NOTE — Patient Instructions (Signed)
Health Maintenance, Female A healthy lifestyle and preventative care can promote health and wellness.  Maintain regular health, dental, and eye exams.  Eat a healthy diet. Foods like vegetables, fruits, whole grains, low-fat dairy products, and lean protein foods contain the nutrients you need without too many calories. Decrease your intake of foods high in solid fats, added sugars, and salt. Get information about a proper diet from your caregiver, if necessary.  Regular physical exercise is one of the most important things you can do for your health. Most adults should get at least 150 minutes of moderate-intensity exercise (any activity that increases your heart rate and causes you to sweat) each week. In addition, most adults need muscle-strengthening exercises on 2 or more days a week.   Maintain a healthy weight. The body mass index (BMI) is a screening tool to identify possible weight problems. It provides an estimate of body fat based on height and weight. Your caregiver can help determine your BMI, and can help you achieve or maintain a healthy weight. For adults 20 years and older:  A BMI below 18.5 is considered underweight.  A BMI of 18.5 to 24.9 is normal.  A BMI of 25 to 29.9 is considered overweight.  A BMI of 30 and above is considered obese.  Maintain normal blood lipids and cholesterol by exercising and minimizing your intake of saturated fat. Eat a balanced diet with plenty of fruits and vegetables. Blood tests for lipids and cholesterol should begin at age 42 and be repeated every 5 years. If your lipid or cholesterol levels are high, you are over 50, or you are a high risk for heart disease, you may need your cholesterol levels checked more frequently.Ongoing high lipid and cholesterol levels should be treated with medicines if diet and exercise are not effective.  If you smoke, find out from your caregiver how to quit. If you do not use tobacco, do not start.  Lung  cancer screening is recommended for adults aged 70-80 years who are at high risk for developing lung cancer because of a history of smoking. Yearly low-dose computed tomography (CT) is recommended for people who have at least a 30-pack-year history of smoking and are a current smoker or have quit within the past 15 years. A pack year of smoking is smoking an average of 1 pack of cigarettes a day for 1 year (for example: 1 pack a day for 30 years or 2 packs a day for 15 years). Yearly screening should continue until the smoker has stopped smoking for at least 15 years. Yearly screening should also be stopped for people who develop a health problem that would prevent them from having lung cancer treatment.  If you are pregnant, do not drink alcohol. If you are breastfeeding, be very cautious about drinking alcohol. If you are not pregnant and choose to drink alcohol, do not exceed 1 drink per day. One drink is considered to be 12 ounces (355 mL) of beer, 5 ounces (148 mL) of wine, or 1.5 ounces (44 mL) of liquor.  Avoid use of street drugs. Do not share needles with anyone. Ask for help if you need support or instructions about stopping the use of drugs.  High blood pressure causes heart disease and increases the risk of stroke. Blood pressure should be checked at least every 1 to 2 years. Ongoing high blood pressure should be treated with medicines, if weight loss and exercise are not effective.  If you are 55 to 72  years old, ask your caregiver if you should take aspirin to prevent strokes.  Diabetes screening involves taking a blood sample to check your fasting blood sugar level. This should be done once every 3 years, after age 91, if you are within normal weight and without risk factors for diabetes. Testing should be considered at a younger age or be carried out more frequently if you are overweight and have at least 1 risk factor for diabetes.  Breast cancer screening is essential preventative care  for women. You should practice "breast self-awareness." This means understanding the normal appearance and feel of your breasts and may include breast self-examination. Any changes detected, no matter how small, should be reported to a caregiver. Women in their 47s and 30s should have a clinical breast exam (CBE) by a caregiver as part of a regular health exam every 1 to 3 years. After age 57, women should have a CBE every year. Starting at age 58, women should consider having a mammogram (breast X-ray) every year. Women who have a family history of breast cancer should talk to their caregiver about genetic screening. Women at a high risk of breast cancer should talk to their caregiver about having an MRI and a mammogram every year.  Breast cancer gene (BRCA)-related cancer risk assessment is recommended for women who have family members with BRCA-related cancers. BRCA-related cancers include breast, ovarian, tubal, and peritoneal cancers. Having family members with these cancers may be associated with an increased risk for harmful changes (mutations) in the breast cancer genes BRCA1 and BRCA2. Results of the assessment will determine the need for genetic counseling and BRCA1 and BRCA2 testing.  The Pap test is a screening test for cervical cancer. Women should have a Pap test starting at age 33. Between ages 67 and 64, Pap tests should be repeated every 2 years. Beginning at age 9, you should have a Pap test every 3 years as long as the past 3 Pap tests have been normal. If you had a hysterectomy for a problem that was not cancer or a condition that could lead to cancer, then you no longer need Pap tests. If you are between ages 72 and 57, and you have had normal Pap tests going back 10 years, you no longer need Pap tests. If you have had past treatment for cervical cancer or a condition that could lead to cancer, you need Pap tests and screening for cancer for at least 20 years after your treatment. If Pap  tests have been discontinued, risk factors (such as a new sexual partner) need to be reassessed to determine if screening should be resumed. Some women have medical problems that increase the chance of getting cervical cancer. In these cases, your caregiver may recommend more frequent screening and Pap tests.  The human papillomavirus (HPV) test is an additional test that may be used for cervical cancer screening. The HPV test looks for the virus that can cause the cell changes on the cervix. The cells collected during the Pap test can be tested for HPV. The HPV test could be used to screen women aged 41 years and older, and should be used in women of any age who have unclear Pap test results. After the age of 61, women should have HPV testing at the same frequency as a Pap test.  Colorectal cancer can be detected and often prevented. Most routine colorectal cancer screening begins at the age of 91 and continues through age 45. However, your caregiver may  recommend screening at an earlier age if you have risk factors for colon cancer. On a yearly basis, your caregiver may provide home test kits to check for hidden blood in the stool. Use of a small camera at the end of a tube, to directly examine the colon (sigmoidoscopy or colonoscopy), can detect the earliest forms of colorectal cancer. Talk to your caregiver about this at age 64, when routine screening begins. Direct examination of the colon should be repeated every 5 to 10 years through age 26, unless early forms of pre-cancerous polyps or small growths are found.  Hepatitis C blood testing is recommended for all people born from [birth year] through 1964 and any individual with known risks for hepatitis C.  Practice safe sex. Use condoms and avoid high-risk sexual practices to reduce the spread of sexually transmitted infections (STIs). Sexually active women aged 39 and younger should be checked for Chlamydia, which is a common sexually transmitted infection.  Older women with new or multiple partners should also be tested for Chlamydia. Testing for other STIs is recommended if you are sexually active and at increased risk.  Osteoporosis is a disease in which the bones lose minerals and strength with aging. This can result in serious bone fractures. The risk of osteoporosis can be identified using a bone density scan. Women ages 34 and over and women at risk for fractures or osteoporosis should discuss screening with their caregivers. Ask your caregiver whether you should be taking a calcium supplement or vitamin D to reduce the rate of osteoporosis.  Menopause can be associated with physical symptoms and risks. Hormone replacement therapy is available to decrease symptoms and risks. You should talk to your caregiver about whether hormone replacement therapy is right for you.  Use sunscreen. Apply sunscreen liberally and repeatedly throughout the day. You should seek shade when your shadow is shorter than you. Protect yourself by wearing long sleeves, pants, a wide-brimmed hat, and sunglasses year round, whenever you are outdoors.  Notify your caregiver of new moles or changes in moles, especially if there is a change in shape or color. Also notify your caregiver if a mole is larger than the size of a pencil eraser.  Stay current with your immunizations. Document Released: 07/26/2010 Document Revised: 05/07/2012 Document Reviewed: 12/12/2012 [facility] Patient Information 2015 [place], [place]. This information is not intended to replace advice given to you by your health care provider. Make sure you discuss any questions you have with your health care provider.

## 2013-07-22 NOTE — Progress Notes (Signed)
Subjective:    Patient ID: Shirley Brown, female    DOB: January 14, 1941, 73 y.o.   MRN: [id number]  Hyperlipidemia This is a chronic problem. The current episode started more than 1 year ago. The problem is controlled. Recent lipid tests were reviewed and are normal. Exacerbating diseases include obesity. She has no history of diabetes or hypothyroidism. Factors aggravating her hyperlipidemia include fatty foods. Pertinent negatives include no chest pain, leg pain, myalgias or shortness of breath. Current antihyperlipidemic treatment includes statins and fibric acid derivatives. The current treatment provides moderate improvement of lipids. Risk factors for coronary artery disease include dyslipidemia, hypertension and obesity.  Hypertension This is a chronic problem. The current episode started more than 1 year ago. The problem has been resolved since onset. The problem is controlled. Associated symptoms include anxiety and palpitations. Pertinent negatives include no chest pain, headaches, peripheral edema or shortness of breath. Risk factors for coronary artery disease include dyslipidemia, obesity and post-menopausal state. Past treatments include calcium channel blockers. The current treatment provides moderate improvement. There is no history of kidney disease, CAD/MI or a thyroid problem. There is no history of sleep apnea.  Gastrophageal Reflux She reports no chest pain, no coughing, no heartburn or no sore throat. This is a chronic problem. The current episode started more than 1 year ago. The problem occurs rarely. The problem has been resolved. The symptoms are aggravated by certain foods. She has tried a PPI for the symptoms. The treatment provided significant relief.  Anxiety Presents for follow-up visit. Symptoms include depressed mood, insomnia and palpitations. Patient reports no chest pain, excessive worry, irritability, nervous/anxious behavior or shortness of breath. Symptoms occur  rarely. The severity of symptoms is mild. The quality of sleep is good.   Her past medical history is significant for anxiety/panic attacks and depression. Past treatments include SSRIs. The treatment provided significant relief. Compliance with prior treatments has been good.  Insomnia  Pt currently taking zolpidem 5 mg at bedtime every night. Pt states she could not sleep without it and denies any problems at this time.   *Pt has hx of left tonsil Ca with XRT in 2007.  Review of Systems  Constitutional: Negative for irritability.  HENT: Negative.  Negative for sore throat.   Respiratory: Negative.  Negative for cough and shortness of breath.   Cardiovascular: Positive for palpitations. Negative for chest pain.  Gastrointestinal: Negative for heartburn.  Genitourinary: Negative.   Musculoskeletal: Negative.  Negative for myalgias.  Neurological: Negative for headaches.  Hematological: Negative.   Psychiatric/Behavioral: The patient has insomnia. The patient is not nervous/anxious.   All other systems reviewed and are negative.      Objective:   Physical Exam  Vitals reviewed. Constitutional: She is oriented to person, place, and time. She appears well-developed and well-nourished. No distress.  HENT:  Head: Normocephalic and atraumatic.  Right Ear: External ear normal.  Mouth/Throat: Oropharynx is clear and moist.  Eyes: Pupils are equal, round, and reactive to light.  Neck: Normal range of motion. Neck supple. No thyromegaly present.  Cardiovascular: Normal rate, regular rhythm, normal heart sounds and intact distal pulses.   No murmur heard. Pulmonary/Chest: Effort normal. No respiratory distress. She has no wheezes.  Breath sounds diminished bilaterally   Abdominal: Soft. Bowel sounds are normal. She exhibits no distension. There is no tenderness.  Musculoskeletal: Normal range of motion. She exhibits no edema and no tenderness.  Neurological: She is alert and oriented to  person, place, and time.  She has normal reflexes. No cranial nerve deficit.  Skin: Skin is warm and dry.  Psychiatric: She has a normal mood and affect. Her behavior is normal. Judgment and thought content normal.    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 Insomnia - zolpidem (AMBIEN) 5 MG tablet; TAKE ONE TABLET BY MOUTH AT BEDTIME AS NEEDED FOR SLEEP  Dispense: 30 tablet; Refill: 1  3. HLD (hyperlipidemia) - Lipid panel  4. Gastroesophageal reflux disease without esophagitis  5. Generalized anxiety disorder  6. Unspecified vitamin D deficiency - Vit D  25 hydroxy (rtn osteoporosis monitoring)  7. Cheilitis - clotrimazole-betamethasone (LOTRISONE) cream; Apply 1 application topically 2 (two) times daily.  Dispense: 30 g; Refill: 6   Continue all meds Labs pending Health Maintenance reviewed hemoccult cards given to patient with directions Diet and exercise encouraged RTO 3 months  Evelina Dun, FNP

## 2013-07-23 LAB — CMP14+EGFR
A/G RATIO: 2 (ref 1.1–2.5)
ALBUMIN: 4.5 g/dL (ref 3.5–4.8)
ALT: 9 IU/L (ref 0–32)
AST: 24 IU/L (ref 0–40)
Alkaline Phosphatase: 41 IU/L (ref 39–117)
BUN/Creatinine Ratio: 12 (ref 11–26)
BUN: 13 mg/dL (ref 8–27)
CO2: 25 mmol/L (ref 18–29)
CREATININE: 1.08 mg/dL — AB (ref 0.57–1.00)
Calcium: 9.6 mg/dL (ref 8.7–10.3)
Chloride: 100 mmol/L (ref 97–108)
GFR, EST AFRICAN AMERICAN: 59 mL/min/{1.73_m2} — AB (ref >59)
GFR, EST NON AFRICAN AMERICAN: 51 mL/min/{1.73_m2} — AB (ref >59)
GLOBULIN, TOTAL: 2.3 g/dL (ref 1.5–4.5)
GLUCOSE: 90 mg/dL (ref 65–99)
Potassium: 4.4 mmol/L (ref 3.5–5.2)
Sodium: 140 mmol/L (ref 134–144)
TOTAL PROTEIN: 6.8 g/dL (ref 6.0–8.5)
Total Bilirubin: 0.4 mg/dL (ref 0.0–1.2)

## 2013-07-23 LAB — LIPID PANEL
CHOL/HDL RATIO: 2.5 ratio (ref 0.0–4.4)
Cholesterol, Total: 163 mg/dL (ref 100–199)
HDL: 64 mg/dL (ref >39)
LDL CALC: 83 mg/dL (ref 0–99)
Triglycerides: 82 mg/dL (ref 0–149)
VLDL Cholesterol Cal: 16 mg/dL (ref 5–40)

## 2013-07-23 LAB — VITAMIN D 25 HYDROXY (VIT D DEFICIENCY, FRACTURES): VIT D 25 HYDROXY: 32.8 ng/mL (ref 30.0–100.0)

## 2013-07-29 ENCOUNTER — Other Ambulatory Visit: Payer: Medicare Other

## 2013-07-29 DIAGNOSIS — Z1212 Encounter for screening for malignant neoplasm of rectum: Secondary | ICD-10-CM

## 2013-07-29 NOTE — Addendum Note (Signed)
Addended by: Selmer Dominion on: 07/29/2013 05:33 PM   Modules accepted: Orders

## 2013-07-31 LAB — FECAL OCCULT BLOOD, IMMUNOCHEMICAL: Fecal Occult Bld: NEGATIVE

## 2013-08-05 ENCOUNTER — Other Ambulatory Visit: Payer: Self-pay | Admitting: Family Medicine

## 2013-08-19 ENCOUNTER — Encounter: Payer: Self-pay | Admitting: *Deleted

## 2013-09-15 ENCOUNTER — Other Ambulatory Visit: Payer: Self-pay | Admitting: Family Medicine

## 2013-10-14 ENCOUNTER — Other Ambulatory Visit: Payer: Self-pay | Admitting: Family Medicine

## 2013-10-23 ENCOUNTER — Ambulatory Visit (INDEPENDENT_AMBULATORY_CARE_PROVIDER_SITE_OTHER): Payer: Medicare Other | Admitting: Family

## 2013-10-23 ENCOUNTER — Encounter: Payer: Self-pay | Admitting: Family

## 2013-10-23 VITALS — BP 138/79 | HR 89 | Temp 97.8°F | Ht 60.0 in | Wt 181.4 lb

## 2013-10-23 DIAGNOSIS — E785 Hyperlipidemia, unspecified: Secondary | ICD-10-CM

## 2013-10-23 DIAGNOSIS — I151 Hypertension secondary to other renal disorders: Secondary | ICD-10-CM

## 2013-10-23 DIAGNOSIS — Z13820 Encounter for screening for osteoporosis: Secondary | ICD-10-CM

## 2013-10-23 DIAGNOSIS — G4700 Insomnia, unspecified: Secondary | ICD-10-CM

## 2013-10-23 DIAGNOSIS — N2889 Other specified disorders of kidney and ureter: Principal | ICD-10-CM

## 2013-10-23 DIAGNOSIS — M1990 Unspecified osteoarthritis, unspecified site: Secondary | ICD-10-CM

## 2013-10-23 DIAGNOSIS — K219 Gastro-esophageal reflux disease without esophagitis: Secondary | ICD-10-CM

## 2013-10-23 DIAGNOSIS — I150 Renovascular hypertension: Secondary | ICD-10-CM

## 2013-10-23 DIAGNOSIS — E559 Vitamin D deficiency, unspecified: Secondary | ICD-10-CM

## 2013-10-23 DIAGNOSIS — F411 Generalized anxiety disorder: Secondary | ICD-10-CM

## 2013-10-23 DIAGNOSIS — M129 Arthropathy, unspecified: Secondary | ICD-10-CM

## 2013-10-23 NOTE — Progress Notes (Signed)
Subjective:    Patient ID: Shirley Brown, female    DOB: 06/13/1940, 73 y.o.   MRN: [id number]  Hypertension This is a chronic problem. The current episode started more than 1 year ago. The problem has been resolved since onset. The problem is controlled. Associated symptoms include anxiety and palpitations. Pertinent negatives include no chest pain, headaches, peripheral edema or shortness of breath. Risk factors for coronary artery disease include dyslipidemia, obesity and post-menopausal state. Past treatments include calcium channel blockers. The current treatment provides moderate improvement. There is no history of kidney disease, CAD/MI or a thyroid problem. There is no history of sleep apnea.  Hyperlipidemia This is a chronic problem. The current episode started more than 1 year ago. The problem is controlled. Recent lipid tests were reviewed and are normal. Exacerbating diseases include obesity. She has no history of diabetes or hypothyroidism. Factors aggravating her hyperlipidemia include fatty foods. Pertinent negatives include no chest pain, leg pain, myalgias or shortness of breath. Current antihyperlipidemic treatment includes statins and fibric acid derivatives. The current treatment provides moderate improvement of lipids. Risk factors for coronary artery disease include dyslipidemia, hypertension and obesity.  Gastrophageal Reflux She reports no chest pain, no coughing, no heartburn or no sore throat. This is a chronic problem. The current episode started more than 1 year ago. The problem occurs rarely. The problem has been resolved. The symptoms are aggravated by certain foods. She has tried a PPI for the symptoms. The treatment provided significant relief.  Anxiety Presents for follow-up visit. Symptoms include depressed mood, insomnia and palpitations. Patient reports no chest pain, excessive worry, irritability, nervous/anxious behavior or shortness of breath. Symptoms occur  rarely. The severity of symptoms is mild. The quality of sleep is good.   Her past medical history is significant for anxiety/panic attacks and depression. Past treatments include SSRIs. The treatment provided significant relief. Compliance with prior treatments has been good.  Ear Fullness  There is pain in the left ear. This is a new problem. The current episode started in the past 7 days. The problem occurs every few minutes. The problem has been waxing and waning. There has been no fever. The pain is at a severity of 6/10. The pain is moderate. Pertinent negatives include no coughing, headaches, rash or sore throat. She has tried acetaminophen for the symptoms. The treatment provided mild relief.  Insomnia Pt currently taking Ambien 5 mg every night. Pt states it helps "some".     Review of Systems  Constitutional: Negative.  Negative for irritability.  HENT: Negative.  Negative for sore throat.   Eyes: Negative.   Respiratory: Negative.  Negative for cough and shortness of breath.   Cardiovascular: Positive for palpitations. Negative for chest pain.  Gastrointestinal: Negative.  Negative for heartburn.  Endocrine: Negative.   Genitourinary: Negative.   Musculoskeletal: Negative.  Negative for myalgias.  Skin: Negative for rash.  Neurological: Negative.  Negative for headaches.  Hematological: Negative.   Psychiatric/Behavioral: The patient has insomnia. The patient is not nervous/anxious.   All other systems reviewed and are negative.      Objective:   Physical Exam  Vitals reviewed. Constitutional: She is oriented to person, place, and time. She appears well-developed and well-nourished. No distress.  HENT:  Head: Normocephalic and atraumatic.  Right Ear: External ear normal.  Mouth/Throat: Oropharynx is clear and moist.  Left ear scab in canal   Eyes: Pupils are equal, round, and reactive to light.  Neck: Normal range of motion.  Neck supple. No thyromegaly present.    Cardiovascular: Normal rate, regular rhythm, normal heart sounds and intact distal pulses.   No murmur heard. Pulmonary/Chest: Effort normal and breath sounds normal. No respiratory distress. She has no wheezes.  Abdominal: Soft. Bowel sounds are normal. She exhibits no distension. There is no tenderness.  Musculoskeletal: Normal range of motion. She exhibits no edema and no tenderness.  Neurological: She is alert and oriented to person, place, and time. She has normal reflexes. No cranial nerve deficit.  Skin: Skin is warm and dry.  Psychiatric: She has a normal mood and affect. Her behavior is normal. Judgment and thought content normal.    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 Gastroesophageal reflux disease without esophagitis - CMP14+EGFR  3. Arthritis - CMP14+EGFR  4. HLD (hyperlipidemia) - CMP14+EGFR - Lipid panel  5. Generalized anxiety disorder - CMP14+EGFR  6. Insomnia - CMP14+EGFR  7. Unspecified vitamin D deficiency - Vit D  25 hydroxy (rtn osteoporosis monitoring)  8. Osteoporosis screening - DG Bone Density; Future   Continue all meds Labs pending Health Maintenance reviewed Diet and exercise encouraged RTO 3 months  Evelina Dun, FNP

## 2013-10-23 NOTE — Patient Instructions (Signed)

## 2013-10-24 LAB — CMP14+EGFR
A/G RATIO: 2 (ref 1.1–2.5)
ALK PHOS: 57 IU/L (ref 39–117)
ALT: 13 IU/L (ref 0–32)
AST: 29 IU/L (ref 0–40)
Albumin: 4.4 g/dL (ref 3.5–4.8)
BILIRUBIN TOTAL: 0.5 mg/dL (ref 0.0–1.2)
BUN / CREAT RATIO: 11 (ref 11–26)
BUN: 11 mg/dL (ref 8–27)
CO2: 25 mmol/L (ref 18–29)
Calcium: 9.7 mg/dL (ref 8.7–10.3)
Chloride: 98 mmol/L (ref 97–108)
Creatinine, Ser: 1.04 mg/dL — ABNORMAL HIGH (ref 0.57–1.00)
GFR, EST AFRICAN AMERICAN: 62 mL/min/{1.73_m2} (ref >59)
GFR, EST NON AFRICAN AMERICAN: 54 mL/min/{1.73_m2} — AB (ref >59)
Globulin, Total: 2.2 g/dL (ref 1.5–4.5)
Glucose: 93 mg/dL (ref 65–99)
POTASSIUM: 4.4 mmol/L (ref 3.5–5.2)
SODIUM: 137 mmol/L (ref 134–144)
TOTAL PROTEIN: 6.6 g/dL (ref 6.0–8.5)

## 2013-10-24 LAB — LIPID PANEL
CHOL/HDL RATIO: 4 ratio (ref 0.0–4.4)
CHOLESTEROL TOTAL: 166 mg/dL (ref 100–199)
HDL: 42 mg/dL (ref >39)
LDL Calculated: 91 mg/dL (ref 0–99)
TRIGLYCERIDES: 166 mg/dL — AB (ref 0–149)
VLDL Cholesterol Cal: 33 mg/dL (ref 5–40)

## 2013-10-24 LAB — VITAMIN D 25 HYDROXY (VIT D DEFICIENCY, FRACTURES): Vit D, 25-Hydroxy: 36.7 ng/mL (ref 30.0–100.0)

## 2013-10-25 ENCOUNTER — Telehealth: Payer: Self-pay | Admitting: Family Medicine

## 2013-10-25 NOTE — Telephone Encounter (Signed)
Message copied by Waverly Ferrari on Fri Oct 25, 2013  1:51 PM ------      Message from: [place], [place] A      Created: Fri Oct 25, 2013 10:48 AM       Kidney and liver function stable      Triglycerides elevated- Pt needs to be on low fat diet      Vit D levels low side of normal- Would benefit from Vit D OTC ------

## 2013-10-26 ENCOUNTER — Encounter: Payer: Self-pay | Admitting: *Deleted

## 2013-11-14 ENCOUNTER — Other Ambulatory Visit: Payer: Self-pay | Admitting: Family

## 2013-11-18 ENCOUNTER — Telehealth: Payer: Self-pay | Admitting: *Deleted

## 2013-11-18 NOTE — Telephone Encounter (Signed)
Last seen 10/23/13 Shirley Brown  If approved route to nurse to call into [facility]

## 2013-11-18 NOTE — Telephone Encounter (Signed)
This is okay for 1 month

## 2013-11-18 NOTE — Telephone Encounter (Signed)
Script called in

## 2013-12-10 ENCOUNTER — Other Ambulatory Visit: Payer: Self-pay | Admitting: Family

## 2013-12-18 ENCOUNTER — Other Ambulatory Visit: Payer: Self-pay | Admitting: Family Medicine

## 2013-12-18 NOTE — Telephone Encounter (Signed)
Last seen 10/23/13 by Alyse Low. Last filled 11/18/13. Call into [facility] if approved

## 2013-12-31 ENCOUNTER — Other Ambulatory Visit: Payer: Self-pay | Admitting: Nurse Practitioner

## 2014-01-12 ENCOUNTER — Other Ambulatory Visit: Payer: Self-pay | Admitting: Family

## 2014-01-13 ENCOUNTER — Other Ambulatory Visit: Payer: Self-pay | Admitting: *Deleted

## 2014-01-13 MED ORDER — ESCITALOPRAM OXALATE 20 MG PO TABS: ORAL_TABLET | ORAL | Status: DC

## 2014-01-29 ENCOUNTER — Ambulatory Visit: Payer: Medicare Other

## 2014-01-29 ENCOUNTER — Other Ambulatory Visit: Payer: Medicare Other

## 2014-02-03 ENCOUNTER — Ambulatory Visit (INDEPENDENT_AMBULATORY_CARE_PROVIDER_SITE_OTHER): Payer: Medicare Other | Admitting: Family

## 2014-02-03 ENCOUNTER — Encounter: Payer: Self-pay | Admitting: Family

## 2014-02-03 VITALS — BP 131/70 | HR 88 | Temp 97.1°F | Ht 60.0 in | Wt 182.6 lb

## 2014-02-03 DIAGNOSIS — N2889 Other specified disorders of kidney and ureter: Secondary | ICD-10-CM

## 2014-02-03 DIAGNOSIS — E785 Hyperlipidemia, unspecified: Secondary | ICD-10-CM

## 2014-02-03 DIAGNOSIS — I151 Hypertension secondary to other renal disorders: Secondary | ICD-10-CM

## 2014-02-03 DIAGNOSIS — I150 Renovascular hypertension: Secondary | ICD-10-CM | POA: Diagnosis not present

## 2014-02-03 DIAGNOSIS — K219 Gastro-esophageal reflux disease without esophagitis: Secondary | ICD-10-CM | POA: Diagnosis not present

## 2014-02-03 DIAGNOSIS — G4700 Insomnia, unspecified: Secondary | ICD-10-CM

## 2014-02-03 DIAGNOSIS — M1990 Unspecified osteoarthritis, unspecified site: Secondary | ICD-10-CM | POA: Diagnosis not present

## 2014-02-03 DIAGNOSIS — R5383 Other fatigue: Secondary | ICD-10-CM

## 2014-02-03 DIAGNOSIS — M129 Arthropathy, unspecified: Secondary | ICD-10-CM | POA: Diagnosis not present

## 2014-02-03 DIAGNOSIS — E559 Vitamin D deficiency, unspecified: Secondary | ICD-10-CM | POA: Diagnosis not present

## 2014-02-03 DIAGNOSIS — F411 Generalized anxiety disorder: Secondary | ICD-10-CM | POA: Diagnosis not present

## 2014-02-03 MED ORDER — AMLODIPINE BESYLATE 5 MG PO TABS: 5.0000 mg | ORAL_TABLET | Freq: Every day | ORAL | Status: DC

## 2014-02-03 MED ORDER — ACYCLOVIR 400 MG PO TABS: ORAL_TABLET | ORAL | Status: DC

## 2014-02-03 MED ORDER — PRAVASTATIN SODIUM 10 MG PO TABS: 10.0000 mg | ORAL_TABLET | Freq: Every day | ORAL | Status: DC

## 2014-02-03 MED ORDER — FENOFIBRATE 145 MG PO TABS: 145.0000 mg | ORAL_TABLET | Freq: Every day | ORAL | Status: DC

## 2014-02-03 MED ORDER — ZOLPIDEM TARTRATE 5 MG PO TABS: 5.0000 mg | ORAL_TABLET | Freq: Every evening | ORAL | Status: DC | PRN

## 2014-02-03 MED ORDER — ESCITALOPRAM OXALATE 20 MG PO TABS: ORAL_TABLET | ORAL | Status: DC

## 2014-02-03 MED ORDER — PANTOPRAZOLE SODIUM 40 MG PO TBEC: DELAYED_RELEASE_TABLET | ORAL | Status: DC

## 2014-02-03 NOTE — Progress Notes (Signed)
Subjective:    Patient ID: Shirley Brown, female    DOB: 01/22/1941, 74 y.o.   MRN: [id number]  Hypertension This is a chronic problem. The current episode started more than 1 year ago. The problem has been resolved since onset. The problem is controlled. Associated symptoms include anxiety and palpitations. Pertinent negatives include no chest pain, headaches, peripheral edema or shortness of breath. Risk factors for coronary artery disease include dyslipidemia, obesity and post-menopausal state. Past treatments include calcium channel blockers. The current treatment provides moderate improvement. There is no history of kidney disease, CAD/MI, CVA, heart failure or a thyroid problem. There is no history of sleep apnea.  Hyperlipidemia This is a chronic problem. The current episode started more than 1 year ago. The problem is controlled. Recent lipid tests were reviewed and are normal. Exacerbating diseases include obesity. She has no history of diabetes or hypothyroidism. Factors aggravating her hyperlipidemia include fatty foods. Pertinent negatives include no chest pain, leg pain, myalgias or shortness of breath. Current antihyperlipidemic treatment includes statins and fibric acid derivatives. The current treatment provides moderate improvement of lipids. Risk factors for coronary artery disease include dyslipidemia, hypertension and obesity.  Gastrophageal Reflux She reports no chest pain or no heartburn. This is a chronic problem. The current episode started more than 1 year ago. The problem occurs rarely. The problem has been resolved. The symptoms are aggravated by certain foods. She has tried a PPI for the symptoms. The treatment provided significant relief.  Anxiety Presents for follow-up visit. Symptoms include excessive worry, insomnia, nervous/anxious behavior and palpitations. Patient reports no chest pain or shortness of breath. Symptoms occur occasionally. The quality of sleep is good.     Her past medical history is significant for anxiety/panic attacks and depression. Past treatments include SSRIs.      Review of Systems  Constitutional: Negative.   HENT: Negative.   Eyes: Negative.   Respiratory: Negative.  Negative for shortness of breath.   Cardiovascular: Positive for palpitations. Negative for chest pain.  Gastrointestinal: Negative.  Negative for heartburn.  Endocrine: Negative.   Genitourinary: Negative.   Musculoskeletal: Negative.  Negative for myalgias.  Neurological: Negative.  Negative for headaches.  Hematological: Negative.   Psychiatric/Behavioral: The patient is nervous/anxious and has insomnia.   All other systems reviewed and are negative.      Objective:   Physical Exam  Constitutional: She is oriented to person, place, and time. She appears well-developed and well-nourished. No distress.  HENT:  Head: Normocephalic and atraumatic.  Right Ear: External ear normal.  Mouth/Throat: Oropharynx is clear and moist.  Eyes: Pupils are equal, round, and reactive to light.  Neck: Normal range of motion. Neck supple. No thyromegaly present.  Cardiovascular: Normal rate, regular rhythm, normal heart sounds and intact distal pulses.   No murmur heard. Pulmonary/Chest: Effort normal and breath sounds normal. No respiratory distress. She has no wheezes.  Abdominal: Soft. Bowel sounds are normal. She exhibits no distension. There is no tenderness.  Musculoskeletal: Normal range of motion. She exhibits no edema or tenderness.  Neurological: She is alert and oriented to person, place, and time. She has normal reflexes. No cranial nerve deficit.  Skin: Skin is warm and dry.  Psychiatric: She has a normal mood and affect. Her behavior is normal. Judgment and thought content normal.  Vitals reviewed.   BP 131/70 mmHg  Pulse 88  Temp(Src) 97.1 F (36.2 C) (Oral)  Ht 5' (1.524 m)  Wt 182 lb 9.6 oz (82.827  kg)  BMI 35.66 kg/m2       Assessment &  Plan:  1. Hypertension secondary to other renal disorders - CMP14+EGFR - amLODipine (NORVASC) 5 MG tablet; Take 1 tablet (5 mg total) by mouth daily.  Dispense: 90 tablet; Refill: 3  2. Gastroesophageal reflux disease without esophagitis - CMP14+EGFR - pantoprazole (PROTONIX) 40 MG tablet; TAKE ONE TABLET BY MOUTH TWO TIME DAILY  Dispense: 180 tablet; Refill: 3  3. Arthritis - CMP14+EGFR  4. Vitamin D deficiency - CMP14+EGFR - Vit D  25 hydroxy (rtn osteoporosis monitoring)  5. Generalized anxiety disorder - CMP14+EGFR - escitalopram (LEXAPRO) 20 MG tablet; TAKE ONE TABLET BY MOUTH ONE TIME DAILY  Dispense: 30 tablet; Refill: 0  6. HLD (hyperlipidemia) - CMP14+EGFR - Lipid panel - fenofibrate (TRICOR) 145 MG tablet; Take 1 tablet (145 mg total) by mouth daily.  Dispense: 90 tablet; Refill: 3 - pravastatin (PRAVACHOL) 10 MG tablet; Take 1 tablet (10 mg total) by mouth at bedtime.  Dispense: 90 tablet; Refill: 3  7. Other fatigue - Thyroid Panel With TSH  8. Insomnia - zolpidem (AMBIEN) 5 MG tablet; Take 1 tablet (5 mg total) by mouth at bedtime as needed. for sleep  Dispense: 30 tablet; Refill: 1   Continue all meds Labs pending Health Maintenance reviewed Diet and exercise encouraged RTO 6 months  Evelina Dun, FNP

## 2014-02-03 NOTE — Patient Instructions (Signed)

## 2014-02-04 LAB — LIPID PANEL
CHOL/HDL RATIO: 3.1 ratio (ref 0.0–4.4)
Cholesterol, Total: 183 mg/dL (ref 100–199)
HDL: 59 mg/dL (ref >39)
LDL Calculated: 95 mg/dL (ref 0–99)
Triglycerides: 147 mg/dL (ref 0–149)
VLDL CHOLESTEROL CAL: 29 mg/dL (ref 5–40)

## 2014-02-04 LAB — CMP14+EGFR
A/G RATIO: 1.7 (ref 1.1–2.5)
ALT: 6 IU/L (ref 0–32)
AST: 21 IU/L (ref 0–40)
Albumin: 4.3 g/dL (ref 3.5–4.8)
Alkaline Phosphatase: 51 IU/L (ref 39–117)
BILIRUBIN TOTAL: 0.3 mg/dL (ref 0.0–1.2)
BUN/Creatinine Ratio: 13 (ref 11–26)
BUN: 14 mg/dL (ref 8–27)
CALCIUM: 9.7 mg/dL (ref 8.7–10.3)
CHLORIDE: 100 mmol/L (ref 97–108)
CO2: 26 mmol/L (ref 18–29)
Creatinine, Ser: 1.12 mg/dL — ABNORMAL HIGH (ref 0.57–1.00)
GFR calc Af Amer: 57 mL/min/{1.73_m2} — ABNORMAL LOW (ref >59)
GFR calc non Af Amer: 49 mL/min/{1.73_m2} — ABNORMAL LOW (ref >59)
GLOBULIN, TOTAL: 2.5 g/dL (ref 1.5–4.5)
GLUCOSE: 121 mg/dL — AB (ref 65–99)
POTASSIUM: 4.5 mmol/L (ref 3.5–5.2)
Sodium: 140 mmol/L (ref 134–144)
Total Protein: 6.8 g/dL (ref 6.0–8.5)

## 2014-02-04 LAB — THYROID PANEL WITH TSH
Free Thyroxine Index: 1.8 (ref 1.2–4.9)
T3 Uptake Ratio: 22 % — ABNORMAL LOW (ref 24–39)
T4, Total: 8.1 ug/dL (ref 4.5–12.0)
TSH: 2.59 u[IU]/mL (ref 0.450–4.500)

## 2014-02-04 LAB — VITAMIN D 25 HYDROXY (VIT D DEFICIENCY, FRACTURES): VIT D 25 HYDROXY: 32.9 ng/mL (ref 30.0–100.0)

## 2014-03-19 ENCOUNTER — Encounter: Payer: Self-pay | Admitting: Pharmacist

## 2014-03-19 ENCOUNTER — Ambulatory Visit (INDEPENDENT_AMBULATORY_CARE_PROVIDER_SITE_OTHER): Payer: Medicare Other

## 2014-03-19 ENCOUNTER — Ambulatory Visit (INDEPENDENT_AMBULATORY_CARE_PROVIDER_SITE_OTHER): Payer: Medicare Other | Admitting: Pharmacist

## 2014-03-19 VITALS — Ht 60.0 in | Wt 185.0 lb

## 2014-03-19 DIAGNOSIS — Z13820 Encounter for screening for osteoporosis: Secondary | ICD-10-CM | POA: Diagnosis not present

## 2014-03-19 DIAGNOSIS — M8580 Other specified disorders of bone density and structure, unspecified site: Secondary | ICD-10-CM

## 2014-03-19 NOTE — Patient Instructions (Signed)
Calcium & Vitamin D: The Facts  Why is calcium and vitamin D consumption important? Calcium: . Most Americans do not consume adequate amounts of calcium! Calcium is required for proper muscle function, nerve communication, bone support, and many other functions in the body.  . The body uses bones as a source of calcium. Bones 'remodel' themselves continuously - the body constantly breaks bone down to release calcium and rebuilds bones by replacing calcium in the bone later.  . As we get older, the rate of bone breakdown occurs faster than bone rebuilding which could lead to osteopenia, osteoporosis, and possible fractures.   Vitamin D: . People naturally make vitamin D in the body when sunlight hits the skin and triggers a process that leads to vitamin D production. This natural vitamin D production requires about 10-15 minutes of sun exposure on the hands, arms, and face at least 2-3 times per week. However, due to decreased sun exposure and the use of sunscreen, most people will need to get additional vitamin D from foods or supplements. Your doctor can measure your body's vitamin D level through a simple blood test to determine your daily vitamin D needs.  . Vitamin D is used to help the body absorb calcium, maintain bone health, help the immune system, and reduce inflammation. It also plays a role in muscle performance, balance and risk of falling.  . Vitamin D deficiency can lead to osteomalacia or softening of the bones, bone pain, and muscle weakness.   The recommended daily allowance of Calcium and Vitamin D varies for different age groups. Age group Calcium (mg) Vitamin D (IU)  Females and Males: Age 38-50 1000 mg 600 IU  Females: Age 28- 30 1200 mg 600 IU  Males: Age 57-70 1000 mg 600 IU  Females and Males: Age 55+ 1200 mg 800 IU  Pregnant/lactating Females age 56-50 1000 mg 600 IU   How much Calcium do you get in your diet? Calcium Intake # of servings per day  Total calcium (mg)    Skim milk, 2% milk (1 cup) _________ x 300 mg   Yogurt (1 small container) _________ x 200 mg   Cheese (1oz) _________ x 200 mg   Cottage Cheese (1 cup)             ________ x 150 mg   Almond milk (1 cup) _________ x 450 mg   Fortified Orange Juice (1 cup) _________ x 300 mg   Broccoli or spinach ( 1 cup) _________ x 100 mg   Salmon (3 oz) _________ x 150 mg    Almonds (1/4 cup) _______ x 90 mg      How do we get Calcium and Vitamin D in our diet? Calcium: . Obtaining calcium from the diet is the most preferred way to reach the recommended daily goal. If this goal is not reached through diet, calcium supplements are available.  . Calcium is found in many foods including: dairy products, dark leafy vegetables (like broccoli, kale, and spinach), fish, and fortified products like juices and cereals.  . The food label will have a %DV (percent daily value) listed showing the amount of calcium per serving. To determine the total mg per serving, simply replace the % with zero (0).  For example, Almond Breeze almond milk contains 45% DV of calcium or 470m per 1 cup.  . You can increase the amount of calcium in your diet by using more calcium products in your daily meals. Use yogurt and fruit  to make smoothies or use yogurt to top baked potatoes or make whipped potatoes. Sprinkle low fat cheese onto salads or into egg white omelets. You can even add non-fat dry milk powder ($RemoveBefo'300mg'ZmsYjxPnnrH$  calcium per 1/3 cup) to hot cereals, meat loaf, soups, or potatoes.  . Calcium supplements come in many forms including tablets, chewables, and gummies. Be sure to read the label to determine the correct number of tablets per serving and whether or not to take the supplement with food.  . Calcium carbonate products (Oscal, Caltrate, and Viactiv) are generally better absorbed when taken with food while calcium citrate products like Citracal can be taken with or without food.  . The body can only absorb about 600 mg of calcium  at one time. It is recommended to take calcium supplements in small amounts several times per day.  However, taking it all at once is better than not taking it at all. . Increasing your intake of calcium is essential for bone health, but may also lead to some side effects like constipation, increased gas, bloating or abdominal cramping. To help reduce these side effects, start with 1 tablet per day and slowly increase your intake of the supplement to the recommended doses. It is also recommended that you drink plenty of water each day. Vitamin D: . Very few foods naturally contain vitamin D. However, it is found in saltwater fish (like tuna, salmon and mackerel), beef liver, egg yolks, cheese and vitamin D fortified foods (like yogurt, cereals, orange juice and milk) . The amount of vitamin D in each food or product is listed as %DV on the product label. To determine the total amount of vitamin D per serving, drop the % sign and multiply the number by 4. For example, 1 cup of Almond Breeze almond milk contains 25% DV vitamin D or 100 IU per serving (25 x 4 =100). . Vitamin D is also found in multivitamins and supplements and may be listed as ergocalciferol (vitamin D2) or cholecalciferol (vitamin D3). Each of these forms of vitamin D are equivalent and the daily recommended intake will vary based on your age and the vitamin D levels in your body. Follow your doctor's recommendation for vitamin D intake.                    Exercise for Strong Bones  Exercise is important to build and maintain strong bones / bone density.  There are 2 types of exercises that are important to building and maintaining strong bones:  Weight- bearing and muscle-stregthening.  Weight-bearing Exercises  These exercises include activities that make you move against gravity while staying upright. Weight-bearing exercises can be high-impact or low-impact.  High-impact weight-bearing exercises help build bones and keep them  strong. If you have broken a bone due to osteoporosis or are at risk of breaking a bone, you may need to avoid high-impact exercises. If you're not sure, you should check with your healthcare provider.  Examples of high-impact weight-bearing exercises are: Dancing  Doing high-impact aerobics  Hiking  Jogging/running  Jumping Rope  Stair climbing  Tennis  Low-impact weight-bearing exercises can also help keep bones strong and are a safe alternative if you cannot do high-impact exercises.   Examples of low-impact weight-bearing exercises are: Using elliptical training machines  Doing low-impact aerobics  Using stair-step machines  Fast walking on a treadmill or outside   Muscle-Strengthening Exercises These exercises include activities where you move your body, a weight or some other  resistance against gravity. They are also known as resistance exercises and include: Lifting weights  Using elastic exercise bands  Using weight machines  Lifting your own body weight  Functional movements, such as standing and rising up on your toes  Yoga and Pilates can also improve strength, balance and flexibility. However, certain positions may not be safe for people with osteoporosis or those at increased risk of broken bones. For example, exercises that have you bend forward may increase the chance of breaking a bone in the spine.   Non-Impact Exercises There are other types of exercises that can help prevent falls.  Non-impact exercises can help you to improve balance, posture and how well you move in everyday activities. Some of these exercises include: Balance exercises that strengthen your legs and test your balance, such as Tai Chi, can decrease your risk of falls.  Posture exercises that improve your posture and reduce rounded or "sloping" shoulders can help you decrease the chance of breaking a bone, especially in the spine.  Functional exercises that improve how well you move can help you  with everyday activities and decrease your chance of falling and breaking a bone. For example, if you have trouble getting up from a chair or climbing stairs, you should do these activities as exercises.   **A physical therapist can teach you balance, posture and functional exercises. He/she can also help you learn which exercises are safe and appropriate for you.  [facility] has a physical therapy office in [place] in front of our office and referrals can be made for assessments and treatment as needed and strength and balance training.  If you would like to have an assessment with [facility] and our physical therapy team please let a nurse or provider know.   Fall Prevention and Home Safety Falls cause injuries and can affect all age groups. It is possible to prevent falls.  HOW TO PREVENT FALLS  Wear shoes with rubber soles that do not have an opening for your toes.  Keep the inside and outside of your house well lit.  Use night lights throughout your home.  Remove clutter from floors.  Clean up floor spills.  Remove throw rugs or fasten them to the floor with carpet tape.  Do not place electrical cords across pathways.  Put grab bars by your tub, shower, and toilet. Do not use towel bars as grab bars.  Put handrails on both sides of the stairway. Fix loose handrails.  Do not climb on stools or stepladders, if possible.  Do not wax your floors.  Repair uneven or unsafe sidewalks, walkways, or stairs.  Keep items you use a lot within reach.  Be aware of pets.  Keep emergency numbers next to the telephone.  Put smoke detectors in your home and near bedrooms. Ask your doctor what other things you can do to prevent falls. Document Released: 11/06/2008 Document Revised: 07/12/2011 Document Reviewed: 04/12/2011 [facility] Patient Information 2015 [place], [place]. This information is not intended to replace advice given to you by your health care provider. Make sure you discuss any  questions you have with your health care provider.

## 2014-03-19 NOTE — Progress Notes (Signed)
Patient ID: Shirley Brown, female   DOB: [birth year], 74 y.o.   MRN: [id number]  [facility] Current Height: Height: 5' (152.4 cm)      Max Lifetime Height:  5\' 2"  Current Weight: Weight: 185 lb (83.915 kg)       Ethnicity:Caucasian    HPI: Patient with osteopenia here today to recheck DEXA / BMD.   Back Pain?  Yes       Kyphosis?  No Prior fracture?  No Med(s) for Osteoporosis/Osteopenia:  none Med(s) previously tried for Osteoporosis/Osteopenia:  none                                                             PMH: Age at menopause:  74 yo Hysterectomy?  No Oophorectomy?  No HRT? Yes - Former.  Type/duration: prempro Steroid Use?  No Thyroid med?  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  Yes - GERD on chronic PPI therapy Current or previous eating disorders?  No Last Vitamin D Result:  32.9 (02/03/2014) Last GFR Result:  49 (02/03/2014)   FH/SH: Family history of osteoporosis?  No Parent with history of hip fracture?  No Family history of breast cancer?  No Exercise?  No Smoking?  No Alcohol?  No    Calcium Assessment Calcium Intake  # of servings/day  Calcium mg  Milk (8 oz) 1  x  300  = 300mg   Yogurt (4 oz) 1 x  200 = 200mg   Cheese (1 oz) 0 x  200 = 0  Other Calcium sources   250mg   Ca supplement 0 = 0   Estimated calcium intake per day 750mg     DEXA Results Date of Test T-Score for AP Spine L1-L4 T-Score for Total Left Hip T-Score for Total Right Hip  03/19/2014 0.9 -0.4 -0.1  01/28/2009 1.0 -0.5 0.0  04.18.2005 1.5 0.3 --       Lowest T-Score = -1.2 (01/28/2009 at neck of left hip / femur)  FRAX 10 year estimate: Total FX risk:  8.9%(consider medication if >/= 20%) Hip FX risk:  1.1%  (consider medication if >/= 3%)  Assessment: Osteopenia with stable BMD and low estimated risk of fracture High risk medication  Recommendations: 1.  Discussed BMD  / DEXA results and discussed fracture risk.   Also discussed fall risk with use of zolpidem.  Patient is on low dose.  She believes that she cannot do without it.  I encouraged her to try 1/2 tablet daily only as needed.  2.  recommend calcium 1200mg  daily through supplementation or diet.  3.  recommend weight bearing exercise - 30 minutes at least 4 days per week.    Specifically advised [employer] [facility] 4.  Counseled and educated about fall risk and prevention.  Recheck DEXA:  2 years  Time spent counseling patient:  20 minutes  Cherre Robins, PharmD, CPP

## 2014-04-01 ENCOUNTER — Ambulatory Visit: Payer: Medicare Other | Admitting: Family

## 2014-04-01 ENCOUNTER — Other Ambulatory Visit (HOSPITAL_COMMUNITY): Payer: Medicare Other

## 2014-04-07 ENCOUNTER — Encounter: Payer: Self-pay | Admitting: Family

## 2014-04-08 ENCOUNTER — Ambulatory Visit (HOSPITAL_COMMUNITY)
Admission: RE | Admit: 2014-04-08 | Discharge: 2014-04-08 | Disposition: A | Discharge status: Home / Self Care | Payer: Medicare Other | Source: Ambulatory Visit | Attending: Family | Admitting: Family

## 2014-04-08 ENCOUNTER — Encounter: Payer: Self-pay | Admitting: Family

## 2014-04-08 ENCOUNTER — Ambulatory Visit (INDEPENDENT_AMBULATORY_CARE_PROVIDER_SITE_OTHER): Payer: Medicare Other | Admitting: Family

## 2014-04-08 VITALS — BP 170/80 | HR 74 | Resp 16 | Ht 60.0 in | Wt 185.0 lb

## 2014-04-08 DIAGNOSIS — Z87891 Personal history of nicotine dependence: Secondary | ICD-10-CM

## 2014-04-08 DIAGNOSIS — I6523 Occlusion and stenosis of bilateral carotid arteries: Secondary | ICD-10-CM | POA: Diagnosis not present

## 2014-04-08 NOTE — Progress Notes (Signed)
Established Carotid Patient   History of Present Illness  Shirley Brown is a 74 y.o. female patient of Dr. Donnetta Hutching followed for moderate bilateral ICA stenosis.  She returns today for 1 year follow up and surveillance. 02/02/2012 carotid artery [place] at [facility] revealed a 50-69% stenoses bilaterally with calcified plaque.  Patient has not had previous carotid artery intervention.  Patient has Negative history of TIA or stroke symptom. The patient denies amaurosis fugax or monocular blindness. The patient denies facial drooping. Pt. denies hemiplegia. The patient denies receptive or expressive aphasia.  She does report tingling and numbness in left hand since about 2013, has remained about the same. She denies dizziness.   She denies claudication symptoms in legs but does report lumbar spine issues and some OA pain in both knees. Patient denies New Medical or Surgical History  Pt Diabetic: No Pt smoker: former smoker, quit in 1995  Pt meds include: Statin : Yes ASA: Yes Other anticoagulants/antiplatelets: no  Past Medical History  Diagnosis Date  . Carotid artery occlusion 05/11/09    Bruit - Right  . Hyperlipidemia   . Hypertension   . Arthritis   . Allergy to meropenem   . Anemia   . Depression 2011  . Cancer     left tonsillar    Social History History  Substance Use Topics  . Smoking status: Former Smoker    Types: Cigarettes    Quit date: 01/24/1993  . Smokeless tobacco: Never Used  . Alcohol Use: No    Family History Family History  Problem Relation Age of Onset  . Heart disease Father   . Heart attack Father   . Hyperlipidemia Father   . Hypertension Father   . Heart disease Sister     before age 59  . Hyperlipidemia Sister   . Hypertension Sister   . Other Sister     varicose veins  . Cancer Brother     Throat and Lung  . Diabetes Son   . Hyperlipidemia Son     Surgical History Past Surgical History  Procedure Laterality  Date  . Cholecystectomy    . Joint replacement Right 1995      Knee  . Joint replacement Left 2001    knee  . Appendectomy    . Throat surgery Left     tonsil cancer-Left    6 1/2 weeks Radiation    Allergies  Allergen Reactions  . Bupropion Other (See Comments)    Lips swelled  . Sulfa Antibiotics Itching and Rash  . Desyrel [Trazodone] Other (See Comments)    Keeps me up all night    Current Outpatient Prescriptions  Medication Sig Dispense Refill  . acyclovir (ZOVIRAX) 400 MG tablet Take one tablet by mouth one time daily (Patient taking differently: as needed. Take one tablet by mouth one time daily) 30 tablet 0  . amLODipine (NORVASC) 5 MG tablet Take 1 tablet (5 mg total) by mouth daily. 90 tablet 3  . aspirin 81 MG tablet Take 81 mg by mouth daily.    . Cholecalciferol (VITAMIN D3) 3000 UNITS TABS Take by mouth.    . clotrimazole-betamethasone (LOTRISONE) cream Apply 1 application topically 2 (two) times daily. 30 g 6  . Cyanocobalamin (VITAMIN B 12 PO) Take by mouth.    . escitalopram (LEXAPRO) 20 MG tablet TAKE ONE TABLET BY MOUTH ONE TIME DAILY 30 tablet 0  . fenofibrate (TRICOR) 145 MG tablet Take 1 tablet (145 mg total) by mouth  daily. 90 tablet 3  . fish oil-omega-3 fatty acids 1000 MG capsule Take 1,200 mg by mouth daily.     . folic acid (FOLVITE) 1 MG tablet TAKE ONE TABLET BY MOUTH ONE TIME DAILY 30 tablet 4  . pantoprazole (PROTONIX) 40 MG tablet TAKE ONE TABLET BY MOUTH TWO TIME DAILY 180 tablet 3  . pravastatin (PRAVACHOL) 10 MG tablet Take 1 tablet (10 mg total) by mouth at bedtime. 90 tablet 3  . vitamin E 200 UNIT capsule Take 400 Units by mouth daily.     Marland Kitchen zolpidem (AMBIEN) 5 MG tablet Take 1 tablet (5 mg total) by mouth at bedtime as needed. for sleep 30 tablet 1   No current facility-administered medications for this visit.    Review of Systems : See HPI for pertinent positives and negatives.  Physical Examination  Filed Vitals:   04/08/14 1414  04/08/14 1419  BP: 150/75 170/80  Pulse: 74 74  Resp:  16  Height:  5' (1.524 m)  Weight:  185 lb (83.915 kg)  SpO2:  99%   Body mass index is 36.13 kg/(m^2).  General: WDWN obese female in NAD GAIT: normal Eyes: PERRLA Pulmonary: Non-labored, CTAB, Negative Rales, Negative rhonchi, & Negative wheezing.  Cardiac: regular Rhythm, no detected murmur.  VASCULAR EXAM Carotid Bruits Left Right   Negative Positive   Radial pulses are 2+ palpable and equal.      LE Pulses LEFT RIGHT   POPLITEAL not palpable  not palpable   POSTERIOR TIBIAL not palpable  not palpable    DORSALIS PEDIS  ANTERIOR TIBIAL palpable  palpable     Gastrointestinal: soft, nontender, BS WNL, no r/g, no palpable masses.  Musculoskeletal: Negative muscle atrophy/wasting. M/S 5/5 throughout, Extremities without ischemic changes.  Neurologic: A&O X 3; Appropriate Affect,  Speech is normal CN 2-12 intact, Pain and light touch intact in extremities, Motor exam as listed above.         Non-Invasive Vascular Imaging CAROTID DUPLEX 04/08/2014 CEREBROVASCULAR DUPLEX EVALUATION    INDICATION: Carotid disease    PREVIOUS INTERVENTION(S):     DUPLEX EXAM:     RIGHT  LEFT  Peak Systolic Velocities (cm/s) End Diastolic Velocities (cm/s) Plaque LOCATION Peak Systolic Velocities (cm/s) End Diastolic Velocities (cm/s) Plaque  122 13  CCA PROXIMAL 94 20   72 19  CCA MID 114 33 HT  77 25  CCA DISTAL 124 36 HT  102 14 HT ECA 104 11 HT  121 35 CP ICA PROXIMAL 116 31 HT  68 18  ICA MID 70 19   87 25  ICA DISTAL 80 22     1.6 ICA / CCA Ratio (PSV) 0.94  Antegrade Vertebral Flow Antegrade   Brachial Systolic Pressure (mmHg)   Multiphasic (subclavian artery) Brachial Artery Waveforms Multiphasic (subclavian artery)    Plaque  Morphology:  HM = Homogeneous, HT = Heterogeneous, CP = Calcific Plaque, SP = Smooth Plaque, IP = Irregular Plaque     ADDITIONAL FINDINGS: . No significant stenosis of the bilateral external or common carotid arteries. . Vessel tortuosity noted in the bilateral distal internal carotid arteries.    IMPRESSION: Doppler velocities suggest less than 40% stenoses of the bilateral proximal internal carotid arteries.    Compared to the previous exam:  No significant change noted when compared to the previous exam on 03/28/13.        Assessment: Milka P. Kuhnle is a 74 y.o. female who presents with asymptomatic less than 40%  stenoses of the bilateral proximal internal carotid arteries. No significant change noted when compared to the previous exam on 03/28/13.    Plan: Follow-up in 1 year with Carotid Duplex.   I discussed in depth with the patient the nature of atherosclerosis, and emphasized the importance of maximal medical management including strict control of blood pressure, blood glucose, and lipid levels, obtaining regular exercise, and continued cessation of smoking.  The patient is aware that without maximal medical management the underlying atherosclerotic disease process will progress, limiting the benefit of any interventions. The patient was given information about stroke prevention and what symptoms should prompt the patient to seek immediate medical care. Thank you for allowing [place] to participate in this patient's care.  Clemon Chambers, RN, MSN, FNP-C [facility] Office: [phone]  Clinic Physician: Early  04/08/2014 2:41 PM

## 2014-04-08 NOTE — Patient Instructions (Signed)
Stroke Prevention Some medical conditions and behaviors are associated with an increased chance of having a stroke. You may prevent a stroke by making healthy choices and managing medical conditions. HOW CAN I REDUCE MY RISK OF HAVING A STROKE?   Stay physically active. Get at least 30 minutes of activity on most or all days.  Do not smoke. It may also be helpful to avoid exposure to secondhand smoke.  Limit alcohol use. Moderate alcohol use is considered to be:  No more than 2 drinks per day for men.  No more than 1 drink per day for nonpregnant women.  Eat healthy foods. This involves:  Eating 5 or more servings of fruits and vegetables a day.  Making dietary changes that address high blood pressure (hypertension), high cholesterol, diabetes, or obesity.  Manage your cholesterol levels.  Making food choices that are high in fiber and low in saturated fat, trans fat, and cholesterol may control cholesterol levels.  Take any prescribed medicines to control cholesterol as directed by your health care provider.  Manage your diabetes.  Controlling your carbohydrate and sugar intake is recommended to manage diabetes.  Take any prescribed medicines to control diabetes as directed by your health care provider.  Control your hypertension.  Making food choices that are low in salt (sodium), saturated fat, trans fat, and cholesterol is recommended to manage hypertension.  Take any prescribed medicines to control hypertension as directed by your health care provider.  Maintain a healthy weight.  Reducing calorie intake and making food choices that are low in sodium, saturated fat, trans fat, and cholesterol are recommended to manage weight.  Stop drug abuse.  Avoid taking birth control pills.  Talk to your health care provider about the risks of taking birth control pills if you are over 35 years old, smoke, get migraines, or have ever had a blood clot.  Get evaluated for sleep  disorders (sleep apnea).  Talk to your health care provider about getting a sleep evaluation if you snore a lot or have excessive sleepiness.  Take medicines only as directed by your health care provider.  For some people, aspirin or blood thinners (anticoagulants) are helpful in reducing the risk of forming abnormal blood clots that can lead to stroke. If you have the irregular heart rhythm of atrial fibrillation, you should be on a blood thinner unless there is a good reason you cannot take them.  Understand all your medicine instructions.  Make sure that other conditions (such as anemia or atherosclerosis) are addressed. SEEK IMMEDIATE MEDICAL CARE IF:   You have sudden weakness or numbness of the face, arm, or leg, especially on one side of the body.  Your face or eyelid droops to one side.  You have sudden confusion.  You have trouble speaking (aphasia) or understanding.  You have sudden trouble seeing in one or both eyes.  You have sudden trouble walking.  You have dizziness.  You have a loss of balance or coordination.  You have a sudden, severe headache with no known cause.  You have new chest pain or an irregular heartbeat. Any of these symptoms may represent a serious problem that is an emergency. Do not wait to see if the symptoms will go away. Get medical help at once. Call your local emergency services (911 in [place] Do not drive yourself to the hospital. Document Released: 02/18/2004 Document Revised: 05/27/2013 Document Reviewed: 07/13/2012 Exit[employer] Patient Information 2015 [employer]. This information is not intended to replace advice given   to you by your health care provider. Make sure you discuss any questions you have with your health care provider.  

## 2014-04-09 NOTE — Addendum Note (Signed)
Addended by: Mena Goes on: 04/09/2014 10:08 AM   Modules accepted: Orders

## 2014-04-10 DIAGNOSIS — Z1231 Encounter for screening mammogram for malignant neoplasm of breast: Secondary | ICD-10-CM | POA: Diagnosis not present

## 2014-04-16 ENCOUNTER — Other Ambulatory Visit: Payer: Self-pay | Admitting: Family

## 2014-04-16 NOTE — Telephone Encounter (Signed)
Last filled 03/19/14, last seen 02/03/14. Route to pool, nurse call into [facility]

## 2014-04-17 NOTE — Telephone Encounter (Signed)
rx called into pharmacy

## 2014-04-22 ENCOUNTER — Other Ambulatory Visit: Payer: Self-pay | Admitting: Family

## 2014-05-13 ENCOUNTER — Other Ambulatory Visit: Payer: Self-pay | Admitting: Family

## 2014-05-23 ENCOUNTER — Other Ambulatory Visit: Payer: Self-pay | Admitting: Family

## 2014-06-06 ENCOUNTER — Ambulatory Visit (INDEPENDENT_AMBULATORY_CARE_PROVIDER_SITE_OTHER): Payer: Medicare Other | Admitting: Family Medicine

## 2014-06-06 ENCOUNTER — Encounter: Payer: Self-pay | Admitting: Family Medicine

## 2014-06-06 VITALS — BP 137/70 | HR 79 | Temp 97.2°F | Ht 60.0 in | Wt 187.4 lb

## 2014-06-06 DIAGNOSIS — G4700 Insomnia, unspecified: Secondary | ICD-10-CM

## 2014-06-06 DIAGNOSIS — Z139 Encounter for screening, unspecified: Secondary | ICD-10-CM

## 2014-06-06 DIAGNOSIS — K219 Gastro-esophageal reflux disease without esophagitis: Secondary | ICD-10-CM

## 2014-06-06 DIAGNOSIS — I10 Essential (primary) hypertension: Secondary | ICD-10-CM

## 2014-06-06 DIAGNOSIS — E785 Hyperlipidemia, unspecified: Secondary | ICD-10-CM | POA: Diagnosis not present

## 2014-06-06 DIAGNOSIS — Z780 Asymptomatic menopausal state: Secondary | ICD-10-CM

## 2014-06-06 DIAGNOSIS — E559 Vitamin D deficiency, unspecified: Secondary | ICD-10-CM

## 2014-06-06 LAB — POCT CBC
GRANULOCYTE PERCENT: 55.5 % (ref 37–80)
HEMATOCRIT: 31.5 % — AB (ref 37.7–47.9)
HEMOGLOBIN: 10.1 g/dL — AB (ref 12.2–16.2)
Lymph, poc: 1.7 (ref 0.6–3.4)
MCH: 28.1 pg (ref 27–31.2)
MCHC: 32.1 g/dL (ref 31.8–35.4)
MCV: 87.7 fL (ref 80–97)
MPV: 7.5 fL (ref 0–99.8)
POC Granulocyte: 2.8 (ref 2–6.9)
POC LYMPH %: 32.5 % (ref 10–50)
Platelet Count, POC: 253 10*3/uL (ref 142–424)
RBC: 3.59 M/uL — AB (ref 4.04–5.48)
RDW, POC: 13.8 %
WBC: 5.1 10*3/uL (ref 4.6–10.2)

## 2014-06-06 NOTE — Progress Notes (Signed)
Subjective:  Patient ID: Naliyah P. Iannaccone, female    DOB: 1940/05/31  Age: 74 y.o. MRN: [id number]  CC: Hypertension; Hyperlipidemia; and Gastrophageal Reflux   HPI Jorge P. Myrick presents for  follow-up of hypertension. Patient has no history of headache chest pain or shortness of breath or recent cough. Patient also denies symptoms of TIA such as numbness weakness lateralizing. Patient checks  blood pressure at home and has not had any elevated readings recently. Patient denies side effects from his medication. States taking it regularly.  Patient also  in for follow-up of elevated cholesterol. Doing well without complaints on current medication. Denies side effects of statin including myalgia and arthralgia and nausea. Also in today for liver function testing. Currently no chest pain, shortness of breath or other cardiovascular related symptoms noted.  I hurt all over. Muscle and joint aches are widespread involving all four ext., neck and back.   History Blayne has a past medical history of Carotid artery occlusion (05/11/09); Hyperlipidemia; Hypertension; Arthritis; Allergy to meropenem; Anemia; Depression (2011); and Cancer.   She has past surgical history that includes Cholecystectomy; Joint replacement (Right, 1995); Joint replacement (Left, 2001); Appendectomy; and Throat surgery (Left).   Her family history includes Cancer in her brother; Diabetes in her son; Heart attack in her father; Heart disease in her father and sister; Hyperlipidemia in her father, sister, and son; Hypertension in her father and sister; Other in her sister.She reports that she quit smoking about 21 years ago. Her smoking use included Cigarettes. She has never used smokeless tobacco. She reports that she does not drink alcohol or use illicit drugs.  Current Outpatient Prescriptions on File Prior to Visit  Medication Sig Dispense Refill  . acyclovir (ZOVIRAX) 400 MG tablet Take one tablet by mouth one time daily  (Patient taking differently: as needed. Take one tablet by mouth one time daily) 30 tablet 0  . amLODipine (NORVASC) 5 MG tablet Take 1 tablet (5 mg total) by mouth daily. 90 tablet 3  . aspirin 81 MG tablet Take 81 mg by mouth daily.    . Cholecalciferol (VITAMIN D3) 3000 UNITS TABS Take by mouth.    . clotrimazole-betamethasone (LOTRISONE) cream Apply 1 application topically 2 (two) times daily. 30 g 6  . Cyanocobalamin (VITAMIN B 12 PO) Take by mouth.    . escitalopram (LEXAPRO) 20 MG tablet TAKE ONE TABLET BY MOUTH ONE TIME DAILY 30 tablet 0  . fenofibrate (TRICOR) 145 MG tablet Take 1 tablet (145 mg total) by mouth daily. 90 tablet 3  . fish oil-omega-3 fatty acids 1000 MG capsule Take 1,200 mg by mouth daily.     . folic acid (FOLVITE) 1 MG tablet TAKE ONE TABLET BY MOUTH ONE TIME DAILY 30 tablet 1  . pantoprazole (PROTONIX) 40 MG tablet TAKE ONE TABLET BY MOUTH TWO TIME DAILY 180 tablet 3  . pravastatin (PRAVACHOL) 10 MG tablet Take 1 tablet (10 mg total) by mouth at bedtime. 90 tablet 3  . vitamin E 200 UNIT capsule Take 400 Units by mouth daily.     Marland Kitchen zolpidem (AMBIEN) 5 MG tablet Take 1 tablet (5 mg total) by mouth at bedtime as needed. for sleep 30 tablet 1   No current facility-administered medications on file prior to visit.    ROS Review of Systems  Constitutional: Negative for fever, chills, diaphoresis, appetite change, fatigue and unexpected weight change.  HENT: Negative for congestion, ear pain, hearing loss, postnasal drip, rhinorrhea, sneezing, sore throat and  trouble swallowing.   Eyes: Negative for pain.  Respiratory: Negative for cough, chest tightness and shortness of breath.   Cardiovascular: Negative for chest pain and palpitations.  Gastrointestinal: Negative for nausea, vomiting, abdominal pain, diarrhea and constipation.  Genitourinary: Negative for dysuria, frequency and menstrual problem.  Musculoskeletal: Negative for joint swelling and arthralgias.  Skin:  Negative for rash.  Neurological: Negative for dizziness, weakness, numbness and headaches.  Psychiatric/Behavioral: Negative for dysphoric mood and agitation.    Objective:  BP 137/70 mmHg  Pulse 79  Temp(Src) 97.2 F (36.2 C) (Oral)  Ht 5' (1.524 m)  Wt 187 lb 6.4 oz (85.004 kg)  BMI 36.60 kg/m2  BP Readings from Last 3 Encounters:  06/06/14 137/70  04/08/14 170/80  02/03/14 131/70    Wt Readings from Last 3 Encounters:  06/06/14 187 lb 6.4 oz (85.004 kg)  04/08/14 185 lb (83.915 kg)  03/19/14 185 lb (83.915 kg)     Physical Exam  Constitutional: She is oriented to person, place, and time. She appears well-developed and well-nourished. No distress.  HENT:  Head: Normocephalic and atraumatic.  Right Ear: External ear normal.  Left Ear: External ear normal.  Nose: Nose normal.  Mouth/Throat: Oropharynx is clear and moist.  Eyes: Conjunctivae and EOM are normal. Pupils are equal, round, and reactive to light.  Neck: Normal range of motion. Neck supple. No thyromegaly present.  Cardiovascular: Normal rate, regular rhythm and normal heart sounds.   No murmur heard. Pulmonary/Chest: Effort normal and breath sounds normal. No respiratory distress. She has no wheezes. She has no rales.  Abdominal: Soft. Bowel sounds are normal. She exhibits no distension. There is no tenderness.  Lymphadenopathy:    She has no cervical adenopathy.  Neurological: She is alert and oriented to person, place, and time. She has normal reflexes.  Skin: Skin is warm and dry.  Psychiatric: She has a normal mood and affect. Her behavior is normal. Judgment and thought content normal.    Lab Results  Component Value Date   HGBA1C 5.4 % 08/03/2012   HGBA1C 4.9 04/24/2012    Lab Results  Component Value Date   WBC 5.1 06/06/2014   HGB 10.1* 06/06/2014   HCT 31.5* 06/06/2014   GLUCOSE 87 06/06/2014   CHOL 143 06/06/2014   TRIG 93 06/06/2014   HDL 50 06/06/2014   LDLCALC 74 06/06/2014    ALT 8 06/06/2014   AST 19 06/06/2014   NA 140 06/06/2014   K 4.5 06/06/2014   CL 101 06/06/2014   CREATININE 1.07* 06/06/2014   BUN 14 06/06/2014   CO2 25 06/06/2014   TSH 2.590 02/03/2014   HGBA1C 5.4 % 08/03/2012    No results found.  Assessment & Plan:   Zalika was seen today for hypertension, hyperlipidemia and gastrophageal reflux.  Diagnoses and all orders for this visit:  Essential hypertension Orders: -     POCT CBC; Standing -     CMP14+EGFR; Standing -     POCT CBC -     CMP14+EGFR  Insomnia  HLD (hyperlipidemia) Orders: -     Lipid panel; Standing -     NMR, lipoprofile; Standing -     Lipid panel  Gastroesophageal reflux disease without esophagitis  Vitamin D deficiency Orders: -     Vit D  25 hydroxy (rtn osteoporosis monitoring); Standing  Postmenopausal Orders: -     Vit D  25 hydroxy (rtn osteoporosis monitoring); Standing  Screening Orders: -     Thyroid  Panel With TSH; Standing   I am having Ms. Mehaffey maintain her fish oil-omega-3 fatty acids, Cyanocobalamin (VITAMIN B 12 PO), Vitamin D3, aspirin, vitamin E, clotrimazole-betamethasone, amLODipine, acyclovir, fenofibrate, zolpidem, pravastatin, pantoprazole, folic acid, and escitalopram.  No orders of the defined types were placed in this encounter.     Follow-up: Return in about 4 months (around 10/07/2014).  Claretta Fraise, M.D.

## 2014-06-07 LAB — CMP14+EGFR
ALBUMIN: 4.2 g/dL (ref 3.5–4.8)
ALT: 8 IU/L (ref 0–32)
AST: 19 IU/L (ref 0–40)
Albumin/Globulin Ratio: 1.9 (ref 1.1–2.5)
Alkaline Phosphatase: 48 IU/L (ref 39–117)
BILIRUBIN TOTAL: 0.5 mg/dL (ref 0.0–1.2)
BUN/Creatinine Ratio: 13 (ref 11–26)
BUN: 14 mg/dL (ref 8–27)
CHLORIDE: 101 mmol/L (ref 97–108)
CO2: 25 mmol/L (ref 18–29)
Calcium: 9.6 mg/dL (ref 8.7–10.3)
Creatinine, Ser: 1.07 mg/dL — ABNORMAL HIGH (ref 0.57–1.00)
GFR calc non Af Amer: 52 mL/min/{1.73_m2} — ABNORMAL LOW (ref >59)
GFR, EST AFRICAN AMERICAN: 60 mL/min/{1.73_m2} (ref >59)
GLOBULIN, TOTAL: 2.2 g/dL (ref 1.5–4.5)
GLUCOSE: 87 mg/dL (ref 65–99)
POTASSIUM: 4.5 mmol/L (ref 3.5–5.2)
Sodium: 140 mmol/L (ref 134–144)
Total Protein: 6.4 g/dL (ref 6.0–8.5)

## 2014-06-07 LAB — LIPID PANEL
CHOL/HDL RATIO: 2.9 ratio (ref 0.0–4.4)
Cholesterol, Total: 143 mg/dL (ref 100–199)
HDL: 50 mg/dL (ref >39)
LDL Calculated: 74 mg/dL (ref 0–99)
Triglycerides: 93 mg/dL (ref 0–149)
VLDL Cholesterol Cal: 19 mg/dL (ref 5–40)

## 2014-06-10 ENCOUNTER — Telehealth: Payer: Self-pay | Admitting: *Deleted

## 2014-06-10 NOTE — Telephone Encounter (Signed)
-----   Message from Claretta Fraise, MD sent at 06/06/2014  8:19 PM EDT ----- U have a moderate anemia which appears to be stable and related to chronic illnesses. It does not appear to be from iron deficiency area and therefore taking iron will not help and is unnecessary. This should cause you no harm

## 2014-06-12 ENCOUNTER — Encounter: Payer: Self-pay | Admitting: *Deleted

## 2014-06-26 ENCOUNTER — Other Ambulatory Visit: Payer: Self-pay | Admitting: Family Medicine

## 2014-07-08 ENCOUNTER — Other Ambulatory Visit: Payer: Self-pay | Admitting: Family

## 2014-07-15 ENCOUNTER — Other Ambulatory Visit: Payer: Self-pay | Admitting: Family

## 2014-07-16 ENCOUNTER — Other Ambulatory Visit: Payer: Self-pay | Admitting: Family

## 2014-07-16 NOTE — Telephone Encounter (Signed)
Last seen 06/06/14 Dr Livia Snellen  If approved route to nurse to call into [facility]

## 2014-07-22 NOTE — Telephone Encounter (Signed)
Please review and advise.

## 2014-07-22 NOTE — Telephone Encounter (Signed)
RX called into [facility] Okayed per Dr Livia Snellen Pt notified

## 2014-09-20 ENCOUNTER — Other Ambulatory Visit: Payer: Self-pay | Admitting: Family Medicine

## 2014-09-22 NOTE — Telephone Encounter (Signed)
Last seen 06/06/14  Dr Livia Snellen   If approved route to nurse to call into [facility]

## 2014-09-23 NOTE — Telephone Encounter (Signed)
Refill called to pharmacy [facility] 

## 2014-09-24 ENCOUNTER — Other Ambulatory Visit: Payer: Self-pay | Admitting: Family Medicine

## 2014-09-24 NOTE — Telephone Encounter (Signed)
Last filled 08/22/14, has appt 10/01/14. Nurse call in at [facility] if approved

## 2014-09-24 NOTE — Telephone Encounter (Signed)
Please review and advise.

## 2014-09-24 NOTE — Telephone Encounter (Signed)
rx called into pharmacy

## 2014-09-26 ENCOUNTER — Other Ambulatory Visit: Payer: Self-pay | Admitting: Family Medicine

## 2014-10-08 ENCOUNTER — Ambulatory Visit: Payer: Medicare Other | Admitting: Family Medicine

## 2014-10-13 ENCOUNTER — Encounter: Payer: Self-pay | Admitting: Family Medicine

## 2014-10-13 ENCOUNTER — Ambulatory Visit (INDEPENDENT_AMBULATORY_CARE_PROVIDER_SITE_OTHER): Payer: Medicare Other | Admitting: Family Medicine

## 2014-10-13 VITALS — BP 162/78 | HR 77 | Temp 98.0°F | Ht 60.0 in | Wt 184.2 lb

## 2014-10-13 DIAGNOSIS — G4700 Insomnia, unspecified: Secondary | ICD-10-CM

## 2014-10-13 DIAGNOSIS — D508 Other iron deficiency anemias: Secondary | ICD-10-CM | POA: Diagnosis not present

## 2014-10-13 DIAGNOSIS — I151 Hypertension secondary to other renal disorders: Secondary | ICD-10-CM | POA: Diagnosis not present

## 2014-10-13 DIAGNOSIS — K130 Diseases of lips: Secondary | ICD-10-CM

## 2014-10-13 DIAGNOSIS — K219 Gastro-esophageal reflux disease without esophagitis: Secondary | ICD-10-CM

## 2014-10-13 DIAGNOSIS — Z23 Encounter for immunization: Secondary | ICD-10-CM | POA: Diagnosis not present

## 2014-10-13 DIAGNOSIS — N2889 Other specified disorders of kidney and ureter: Secondary | ICD-10-CM | POA: Diagnosis not present

## 2014-10-13 DIAGNOSIS — E785 Hyperlipidemia, unspecified: Secondary | ICD-10-CM

## 2014-10-13 DIAGNOSIS — I10 Essential (primary) hypertension: Secondary | ICD-10-CM

## 2014-10-13 DIAGNOSIS — R0600 Dyspnea, unspecified: Secondary | ICD-10-CM

## 2014-10-13 DIAGNOSIS — K5901 Slow transit constipation: Secondary | ICD-10-CM | POA: Diagnosis not present

## 2014-10-13 MED ORDER — FOLIC ACID 1 MG PO TABS: 1.0000 mg | ORAL_TABLET | Freq: Every day | ORAL | Status: DC

## 2014-10-13 MED ORDER — LINACLOTIDE 145 MCG PO CAPS: 145.0000 ug | ORAL_CAPSULE | Freq: Every day | ORAL | Status: DC

## 2014-10-13 MED ORDER — ZOLPIDEM TARTRATE 10 MG PO TABS: 5.0000 mg | ORAL_TABLET | Freq: Every evening | ORAL | Status: DC | PRN

## 2014-10-13 MED ORDER — CLOTRIMAZOLE-BETAMETHASONE 1-0.05 % EX CREA: 1.0000 "application " | TOPICAL_CREAM | Freq: Two times a day (BID) | CUTANEOUS | Status: DC

## 2014-10-13 MED ORDER — AMLODIPINE BESYLATE 10 MG PO TABS: 10.0000 mg | ORAL_TABLET | Freq: Every day | ORAL | Status: DC

## 2014-10-13 MED ORDER — ACYCLOVIR 400 MG PO TABS: 400.0000 mg | ORAL_TABLET | Freq: Every day | ORAL | Status: DC

## 2014-10-13 MED ORDER — ESCITALOPRAM OXALATE 20 MG PO TABS: 20.0000 mg | ORAL_TABLET | Freq: Every day | ORAL | Status: DC

## 2014-10-13 NOTE — Progress Notes (Signed)
Subjective:  Patient ID: Shirley Brown, female    DOB: [birth year]  Age: 74 y.o. MRN: [id number]  CC: Hypertension; Hyperlipidemia; Insomnia; and Gastrophageal Reflux   HPI Shirley Brown presents for constipated. No BM for 4 days. Bloated. Tried OTC stool softener. Then miralax. Patient states that she has some shortness of breath but she feels that that is brought on by her constipation because when she feels bloated and has not had a bowel movement for several days is when the shortness of breath seems to be most noticeable.  She brings in a form from a home health nurse stating that she has had a carotid bruit and a murmur. Patient states that she's not about the bruit for some time and gets ultrasounds for that annually. The report from earlier this year was reviewed showing only 40% involvement of the carotids bilaterally. Patient denies TIA symptoms.   follow-up of hypertension. Patient has no history of headache chest pain  or recent cough. Patient also denies symptoms of TIA such as numbness weakness lateralizing. Patient checks  blood pressure at home and has not had any elevated readings recently. Patient denies side effects from his medication. States taking it regularly.   Patient in for follow-up of elevated cholesterol. Doing well without complaints on current medication. Denies side effects of statin including myalgia and arthralgia and nausea. Also in today for liver function testing. Currently no chest pain, shortness of breath or other cardiovascular related symptoms noted.  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   She has past surgical history that includes Cholecystectomy; Joint replacement (Right, 1995); Joint replacement (Left, 2001); Appendectomy; and Throat surgery (Left).   Her family history includes Cancer in her brother;  Diabetes in her son; Heart attack in her father; Heart disease in her father and sister; Hyperlipidemia in her father, sister, and son; Hypertension in her father and sister; Other in her sister.She reports that she quit smoking about 21 years ago. Her smoking use included Cigarettes. She has never used smokeless tobacco. She reports that she does not drink alcohol or use illicit drugs.  Outpatient Prescriptions Prior to Visit  Medication Sig Dispense Refill  . aspirin 81 MG tablet Take 81 mg by mouth daily.    . Cholecalciferol (VITAMIN D3) 3000 UNITS TABS Take by mouth.    . Cyanocobalamin (VITAMIN B 12 PO) Take by mouth.    . fenofibrate (TRICOR) 145 MG tablet Take 1 tablet (145 mg total) by mouth daily. 90 tablet 3  . fish oil-omega-3 fatty acids 1000 MG capsule Take 1,200 mg by mouth daily.     . pantoprazole (PROTONIX) 40 MG tablet TAKE ONE TABLET BY MOUTH TWO TIME DAILY 180 tablet 3  . pravastatin (PRAVACHOL) 10 MG tablet Take 1 tablet (10 mg total) by mouth at bedtime. 90 tablet 3  . vitamin E 200 UNIT capsule Take 400 Units by mouth daily.     Marland Kitchen acyclovir (ZOVIRAX) 400 MG tablet TAKE ONE TABLET BY MOUTH ONE TIME DAILY 30 tablet 2  . amLODipine (NORVASC) 5 MG tablet Take 1 tablet (5 mg total) by mouth daily. 90 tablet 3  . clotrimazole-betamethasone (LOTRISONE) cream Apply 1 application topically 2 (two) times daily. 30 g 6  . escitalopram (LEXAPRO) 20 MG tablet TAKE ONE TABLET BY MOUTH ONE TIME DAILY 30 tablet 1  . folic acid (FOLVITE) 1 MG tablet TAKE ONE  TABLET BY MOUTH ONE TIME DAILY 30 tablet 4  . zolpidem (AMBIEN) 10 MG tablet TAKE 1/2 TO 1 TABLET BY MOUTH AT BEDTIME AS NEEDED FOR SLEEP 15 tablet 0  . zolpidem (AMBIEN) 5 MG tablet Take 1 tablet (5 mg total) by mouth at bedtime as needed. for sleep (Patient not taking: Reported on 10/13/2014) 30 tablet 1   No facility-administered medications prior to visit.    ROS Review of Systems  Constitutional: Negative for fever, chills,  diaphoresis, appetite change, fatigue and unexpected weight change.  HENT: Negative for congestion, ear pain, hearing loss, postnasal drip, rhinorrhea, sneezing, sore throat and trouble swallowing.   Eyes: Negative for pain.  Respiratory: Negative for cough, chest tightness and shortness of breath.   Cardiovascular: Negative for chest pain and palpitations.  Gastrointestinal: Negative for nausea, vomiting, abdominal pain, diarrhea and constipation.  Genitourinary: Negative for dysuria, frequency and menstrual problem.  Musculoskeletal: Negative for joint swelling and arthralgias.  Skin: Negative for rash.  Neurological: Negative for dizziness, weakness, numbness and headaches.  Psychiatric/Behavioral: Negative for dysphoric mood and agitation.    Objective:  BP 162/78 mmHg  Pulse 77  Temp(Src) 98 F (36.7 C) (Oral)  Ht 5' (1.524 m)  Wt 184 lb 3.2 oz (83.553 kg)  BMI 35.97 kg/m2  BP Readings from Last 3 Encounters:  10/13/14 162/78  06/06/14 137/70  04/08/14 170/80    Wt Readings from Last 3 Encounters:  10/13/14 184 lb 3.2 oz (83.553 kg)  06/06/14 187 lb 6.4 oz (85.004 kg)  04/08/14 185 lb (83.915 kg)   Of note is that today's blood pressure was noted to be bifid in that there was a gap between 140 and 120 leading to likely errors in reading on her home machine that may well explain the discrepancy between our reading and her ears.  Physical Exam  Constitutional: She is oriented to person, place, and time. She appears well-developed and well-nourished. No distress.  HENT:  Head: Normocephalic and atraumatic.  Right Ear: External ear normal.  Left Ear: External ear normal.  Nose: Nose normal.  Mouth/Throat: Oropharynx is clear and moist.  Eyes: Conjunctivae and EOM are normal. Pupils are equal, round, and reactive to light.  Neck: Normal range of motion. Neck supple. No thyromegaly present.  Cardiovascular: Normal rate, regular rhythm and normal heart sounds.   No murmur  heard. Pulmonary/Chest: Effort normal and breath sounds normal. No respiratory distress. She has no wheezes. She has no rales.  Abdominal: Soft. Bowel sounds are normal. She exhibits no distension. There is no tenderness.  Lymphadenopathy:    She has no cervical adenopathy.  Neurological: She is alert and oriented to person, place, and time. She has normal reflexes.  Skin: Skin is warm and dry.  Psychiatric: She has a normal mood and affect. Her behavior is normal. Judgment and thought content normal.  Vitals reviewed.   Lab Results  Component Value Date   HGBA1C 5.4 % 08/03/2012   HGBA1C 4.9 04/24/2012    Lab Results  Component Value Date   WBC 5.1 06/06/2014   HGB 10.1* 06/06/2014   HCT 31.5* 06/06/2014   GLUCOSE 87 06/06/2014   CHOL 143 06/06/2014   TRIG 93 06/06/2014   HDL 50 06/06/2014   LDLCALC 74 06/06/2014   ALT 8 06/06/2014   AST 19 06/06/2014   NA 140 06/06/2014   K 4.5 06/06/2014   CL 101 06/06/2014   CREATININE 1.07* 06/06/2014   BUN 14 06/06/2014   CO2 25  06/06/2014   TSH 2.590 02/03/2014   HGBA1C 5.4 % 08/03/2012    No results found.  Assessment & Plan:   Shirley Brown was seen today for hypertension, hyperlipidemia, insomnia and gastrophageal reflux.  Diagnoses and all orders for this visit:  Cheilitis -     clotrimazole-betamethasone (LOTRISONE) cream; Apply 1 application topically 2 (two) times daily.  HLD (hyperlipidemia) -     CMP14+EGFR -     Lipid panel  Essential hypertension -     CMP14+EGFR  Gastroesophageal reflux disease without esophagitis  Insomnia  Other iron deficiency anemias -     CBC with Differential/Platelet  Hypertension secondary to other renal disorders -     amLODipine (NORVASC) 10 MG tablet; Take 1 tablet (10 mg total) by mouth daily.  Other orders -     acyclovir (ZOVIRAX) 400 MG tablet; Take 1 tablet (400 mg total) by mouth daily. -     escitalopram (LEXAPRO) 20 MG tablet; Take 1 tablet (20 mg total) by mouth  daily. -     folic acid (FOLVITE) 1 MG tablet; Take 1 tablet (1 mg total) by mouth daily. -     zolpidem (AMBIEN) 10 MG tablet; Take 0.5-1 tablets (5-10 mg total) by mouth at bedtime as needed. for sleep -     Pneumococcal conjugate vaccine 13-valent -     Linaclotide (LINZESS) 145 MCG CAPS capsule; Take 1 capsule (145 mcg total) by mouth daily.   I have discontinued Ms. Glade's zolpidem. I have also changed her acyclovir, escitalopram, folic acid, zolpidem, and amLODipine. Additionally, I am having her start on Linaclotide. Lastly, I am having her maintain her fish oil-omega-3 fatty acids, Cyanocobalamin (VITAMIN B 12 PO), Vitamin D3, aspirin, vitamin E, fenofibrate, pravastatin, pantoprazole, and clotrimazole-betamethasone.  Meds ordered this encounter  Medications  . acyclovir (ZOVIRAX) 400 MG tablet    Sig: Take 1 tablet (400 mg total) by mouth daily.    Dispense:  30 tablet    Refill:  2  . clotrimazole-betamethasone (LOTRISONE) cream    Sig: Apply 1 application topically 2 (two) times daily.    Dispense:  30 g    Refill:  6  . escitalopram (LEXAPRO) 20 MG tablet    Sig: Take 1 tablet (20 mg total) by mouth daily.    Dispense:  90 tablet    Refill:  1  . folic acid (FOLVITE) 1 MG tablet    Sig: Take 1 tablet (1 mg total) by mouth daily.    Dispense:  90 tablet    Refill:  1  . zolpidem (AMBIEN) 10 MG tablet    Sig: Take 0.5-1 tablets (5-10 mg total) by mouth at bedtime as needed. for sleep    Dispense:  15 tablet    Refill:  5  . Linaclotide (LINZESS) 145 MCG CAPS capsule    Sig: Take 1 capsule (145 mcg total) by mouth daily.    Dispense:  30 capsule    Refill:  5  . amLODipine (NORVASC) 10 MG tablet    Sig: Take 1 tablet (10 mg total) by mouth daily.    Dispense:  90 tablet    Refill:  3     Follow-up: Return in about 1 month (around 11/12/2014) for hypertension.  Claretta Fraise, M.D.

## 2014-10-14 LAB — LIPID PANEL
CHOLESTEROL TOTAL: 162 mg/dL (ref 100–199)
Chol/HDL Ratio: 3.4 ratio units (ref 0.0–4.4)
HDL: 47 mg/dL (ref >39)
LDL Calculated: 91 mg/dL (ref 0–99)
TRIGLYCERIDES: 118 mg/dL (ref 0–149)
VLDL Cholesterol Cal: 24 mg/dL (ref 5–40)

## 2014-10-14 LAB — CMP14+EGFR
A/G RATIO: 1.8 (ref 1.1–2.5)
ALT: 9 IU/L (ref 0–32)
AST: 32 IU/L (ref 0–40)
Albumin: 4.3 g/dL (ref 3.5–4.8)
Alkaline Phosphatase: 38 IU/L — ABNORMAL LOW (ref 39–117)
BILIRUBIN TOTAL: 0.5 mg/dL (ref 0.0–1.2)
BUN/Creatinine Ratio: 14 (ref 11–26)
BUN: 14 mg/dL (ref 8–27)
CHLORIDE: 97 mmol/L (ref 97–108)
CO2: 24 mmol/L (ref 18–29)
Calcium: 9.6 mg/dL (ref 8.7–10.3)
Creatinine, Ser: 1 mg/dL (ref 0.57–1.00)
GFR calc Af Amer: 65 mL/min/{1.73_m2} (ref >59)
GFR calc non Af Amer: 56 mL/min/{1.73_m2} — ABNORMAL LOW (ref >59)
GLUCOSE: 94 mg/dL (ref 65–99)
Globulin, Total: 2.4 g/dL (ref 1.5–4.5)
POTASSIUM: 4 mmol/L (ref 3.5–5.2)
Sodium: 138 mmol/L (ref 134–144)
Total Protein: 6.7 g/dL (ref 6.0–8.5)

## 2014-10-14 LAB — CBC WITH DIFFERENTIAL/PLATELET
BASOS ABS: 0 10*3/uL (ref 0.0–0.2)
BASOS: 1 %
EOS (ABSOLUTE): 0.2 10*3/uL (ref 0.0–0.4)
Eos: 4 %
Hematocrit: 31.6 % — ABNORMAL LOW (ref 34.0–46.6)
Hemoglobin: 10.4 g/dL — ABNORMAL LOW (ref 11.1–15.9)
IMMATURE GRANULOCYTES: 0 %
Immature Grans (Abs): 0 10*3/uL (ref 0.0–0.1)
LYMPHS: 36 %
Lymphocytes Absolute: 1.8 10*3/uL (ref 0.7–3.1)
MCH: 30.1 pg (ref 26.6–33.0)
MCHC: 32.9 g/dL (ref 31.5–35.7)
MCV: 92 fL (ref 79–97)
MONOS ABS: 0.7 10*3/uL (ref 0.1–0.9)
Monocytes: 13 %
NEUTROS ABS: 2.3 10*3/uL (ref 1.4–7.0)
NEUTROS PCT: 46 %
PLATELETS: 281 10*3/uL (ref 150–379)
RBC: 3.45 x10E6/uL — ABNORMAL LOW (ref 3.77–5.28)
RDW: 13.8 % (ref 12.3–15.4)
WBC: 5 10*3/uL (ref 3.4–10.8)

## 2014-11-04 DIAGNOSIS — H04123 Dry eye syndrome of bilateral lacrimal glands: Secondary | ICD-10-CM | POA: Diagnosis not present

## 2014-11-04 DIAGNOSIS — D3130 Benign neoplasm of unspecified choroid: Secondary | ICD-10-CM | POA: Diagnosis not present

## 2014-11-04 DIAGNOSIS — Z961 Presence of intraocular lens: Secondary | ICD-10-CM | POA: Diagnosis not present

## 2014-11-04 DIAGNOSIS — H26493 Other secondary cataract, bilateral: Secondary | ICD-10-CM | POA: Diagnosis not present

## 2014-11-12 ENCOUNTER — Ambulatory Visit: Payer: Self-pay | Admitting: Family Medicine

## 2014-11-12 ENCOUNTER — Encounter: Payer: Self-pay | Admitting: Family Medicine

## 2014-11-12 ENCOUNTER — Ambulatory Visit (INDEPENDENT_AMBULATORY_CARE_PROVIDER_SITE_OTHER): Payer: Medicare Other | Admitting: Family Medicine

## 2014-11-12 VITALS — BP 139/61 | HR 75 | Temp 96.7°F | Ht 60.0 in | Wt 176.0 lb

## 2014-11-12 DIAGNOSIS — Z23 Encounter for immunization: Secondary | ICD-10-CM | POA: Diagnosis not present

## 2014-11-12 DIAGNOSIS — M1990 Unspecified osteoarthritis, unspecified site: Secondary | ICD-10-CM | POA: Diagnosis not present

## 2014-11-12 DIAGNOSIS — I10 Essential (primary) hypertension: Secondary | ICD-10-CM

## 2014-11-12 NOTE — Patient Instructions (Signed)
Continue meds as is.

## 2014-11-12 NOTE — Progress Notes (Signed)
Subjective:  Patient ID: Shirley Brown, female    DOB: 1940/03/26  Age: 74 y.o. MRN: [id number]  CC: Hypertension   HPI [name] presents for  follow-up of hypertension. Patient has no history of headache chest pain or shortness of breath or recent cough. Patient also denies symptoms of TIA such as numbness weakness lateralizing. Patient checks  blood pressure at home and has not had any elevated readings recently. Patient denies side effects from his medication. States taking it regularly. She will be doing fine except that she has arthritis pain. But she is dealing with that and does not desire treatment at this time.   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   She has past surgical history that includes Cholecystectomy; Joint replacement (Right, 1995); Joint replacement (Left, 2001); Appendectomy; and Throat surgery (Left).   Her family history includes Cancer in her brother; Diabetes in her son; Heart attack in her father; Heart disease in her father and sister; Hyperlipidemia in her father, sister, and son; Hypertension in her father and sister; Other in her sister.She reports that she quit smoking about 21 years ago. Her smoking use included Cigarettes. She has never used smokeless tobacco. She reports that she does not drink alcohol or use illicit drugs.  Current Outpatient Prescriptions on File Prior to Visit  Medication Sig Dispense Refill  . acyclovir (ZOVIRAX) 400 MG tablet Take 1 tablet (400 mg total) by mouth daily. 30 tablet 2  . amLODipine (NORVASC) 10 MG tablet Take 1 tablet (10 mg total) by mouth daily. 90 tablet 3  . aspirin 81 MG tablet Take 81 mg by mouth daily.    . Cholecalciferol (VITAMIN D3) 3000 UNITS TABS Take by mouth.    . clotrimazole-betamethasone (LOTRISONE) cream Apply 1 application topically 2 (two) times  daily. 30 g 6  . Cyanocobalamin (VITAMIN B 12 PO) Take by mouth.    . escitalopram (LEXAPRO) 20 MG tablet Take 1 tablet (20 mg total) by mouth daily. 90 tablet 1  . fenofibrate (TRICOR) 145 MG tablet Take 1 tablet (145 mg total) by mouth daily. 90 tablet 3  . fish oil-omega-3 fatty acids 1000 MG capsule Take 1,200 mg by mouth daily.     . folic acid (FOLVITE) 1 MG tablet Take 1 tablet (1 mg total) by mouth daily. 90 tablet 1  . Linaclotide (LINZESS) 145 MCG CAPS capsule Take 1 capsule (145 mcg total) by mouth daily. 30 capsule 5  . pantoprazole (PROTONIX) 40 MG tablet TAKE ONE TABLET BY MOUTH TWO TIME DAILY 180 tablet 3  . pravastatin (PRAVACHOL) 10 MG tablet Take 1 tablet (10 mg total) by mouth at bedtime. 90 tablet 3  . vitamin E 200 UNIT capsule Take 400 Units by mouth daily.     Marland Kitchen zolpidem (AMBIEN) 10 MG tablet Take 0.5-1 tablets (5-10 mg total) by mouth at bedtime as needed. for sleep 15 tablet 5   No current facility-administered medications on file prior to visit.    ROS Review of Systems  Constitutional: Negative for fever, activity change and appetite change.  HENT: Negative for congestion, rhinorrhea and sore throat.   Eyes: Negative for pain and visual disturbance.  Respiratory: Negative for cough and shortness of breath.   Gastrointestinal: Negative for nausea and abdominal pain.  Musculoskeletal: Positive for myalgias and arthralgias. Negative for joint swelling.    Objective:  BP 139/61  mmHg  Pulse 75  Temp(Src) 96.7 F (35.9 C) (Oral)  Ht 5' (1.524 m)  Wt 176 lb (79.833 kg)  BMI 34.37 kg/m2  BP Readings from Last 3 Encounters:  11/12/14 139/61  10/13/14 162/78  06/06/14 137/70    Wt Readings from Last 3 Encounters:  11/12/14 176 lb (79.833 kg)  10/13/14 184 lb 3.2 oz (83.553 kg)  06/06/14 187 lb 6.4 oz (85.004 kg)     Physical Exam  Constitutional: She is oriented to person, place, and time. She appears well-developed and well-nourished. No distress.    HENT:  Head: Normocephalic and atraumatic.  Eyes: Conjunctivae are normal. Pupils are equal, round, and reactive to light.  Neck: Normal range of motion. Neck supple. No thyromegaly present.  Cardiovascular: Normal rate, regular rhythm and normal heart sounds.   No murmur heard. Pulmonary/Chest: Effort normal and breath sounds normal. No respiratory distress. She has no wheezes. She has no rales.  Abdominal: Soft. Bowel sounds are normal. She exhibits no distension. There is no tenderness.  Musculoskeletal: Normal range of motion.  Lymphadenopathy:    She has no cervical adenopathy.  Neurological: She is alert and oriented to person, place, and time.  Skin: Skin is warm and dry.  Psychiatric: She has a normal mood and affect. Her behavior is normal. Judgment and thought content normal.    Lab Results  Component Value Date   HGBA1C 5.4 % 08/03/2012   HGBA1C 4.9 04/24/2012    Lab Results  Component Value Date   WBC 5.0 10/13/2014   HGB 10.1* 06/06/2014   HCT 31.6* 10/13/2014   GLUCOSE 94 10/13/2014   CHOL 162 10/13/2014   TRIG 118 10/13/2014   HDL 47 10/13/2014   LDLCALC 91 10/13/2014   ALT 9 10/13/2014   AST 32 10/13/2014   NA 138 10/13/2014   K 4.0 10/13/2014   CL 97 10/13/2014   CREATININE 1.00 10/13/2014   BUN 14 10/13/2014   CO2 24 10/13/2014   TSH 2.590 02/03/2014   HGBA1C 5.4 % 08/03/2012    No results found.  Assessment & Plan:   Wilberta was seen today for hypertension.  Diagnoses and all orders for this visit:  Encounter for immunization  Essential hypertension  Arthritis  Other orders -     Flu Vaccine QUAD 36+ mos IM   I am having Ms. Rood maintain her fish oil-omega-3 fatty acids, Cyanocobalamin (VITAMIN B 12 PO), Vitamin D3, aspirin, vitamin E, fenofibrate, pravastatin, pantoprazole, acyclovir, clotrimazole-betamethasone, escitalopram, folic acid, zolpidem, Linaclotide, and amLODipine.  No orders of the defined types were placed in this  encounter.     Follow-up: Return in about 6 months (around 05/13/2015) for CPE.  Claretta Fraise, M.D.

## 2015-01-20 DIAGNOSIS — L03211 Cellulitis of face: Secondary | ICD-10-CM | POA: Diagnosis not present

## 2015-01-22 ENCOUNTER — Ambulatory Visit (INDEPENDENT_AMBULATORY_CARE_PROVIDER_SITE_OTHER): Payer: Medicare Other | Admitting: Family

## 2015-01-22 ENCOUNTER — Encounter: Payer: Self-pay | Admitting: Family

## 2015-01-22 VITALS — BP 129/73 | HR 76 | Temp 97.1°F | Ht 60.0 in | Wt 171.6 lb

## 2015-01-22 DIAGNOSIS — L03211 Cellulitis of face: Secondary | ICD-10-CM

## 2015-01-22 NOTE — Patient Instructions (Signed)

## 2015-01-22 NOTE — Progress Notes (Signed)
   Subjective:    Patient ID: Shirley Brown, female    DOB: [birth year], 74 y.o.   MRN: [id number]  HPI Pt presents to the office today to recheck cellulitis of right side of her face. Pt states this started on 01/20/15 and went to the Urgent Care. PT states she was given doxycycline 100 mg BID for 10 days and was given a  Rocephin IM injection. PT reports feeling better today and states the redness if greatly improved. PT states she continues to have intermittent burning/soreness pain 6-7 out 10 and warmth to her face.    Review of Systems  Constitutional: Negative.   HENT: Negative.   Eyes: Negative.   Respiratory: Negative.  Negative for shortness of breath.   Cardiovascular: Negative.  Negative for palpitations.  Gastrointestinal: Negative.   Endocrine: Negative.   Genitourinary: Negative.   Musculoskeletal: Negative.   Neurological: Negative.  Negative for headaches.  Hematological: Negative.   Psychiatric/Behavioral: Negative.   All other systems reviewed and are negative.      Objective:   Physical Exam  Constitutional: She is oriented to person, place, and time. She appears well-developed and well-nourished. No distress.  HENT:  Head: Normocephalic and atraumatic.  Right Ear: External ear normal.  Mouth/Throat: Oropharynx is clear and moist.  Eyes: Pupils are equal, round, and reactive to light.  Neck: Normal range of motion. Neck supple. No thyromegaly present.  Cardiovascular: Normal rate, regular rhythm, normal heart sounds and intact distal pulses.   No murmur heard. Pulmonary/Chest: Effort normal and breath sounds normal. No respiratory distress. She has no wheezes.  Abdominal: Soft. Bowel sounds are normal. She exhibits no distension. There is no tenderness.  Musculoskeletal: Normal range of motion. She exhibits no edema or tenderness.  Neurological: She is alert and oriented to person, place, and time. She has normal reflexes. No cranial nerve deficit.  Skin:  Skin is warm and dry. There is erythema (generalized erythemas that extends from pt's right ear, chin, forhead, and base of nose. Does not cross the plane.  No blisters present ).  Psychiatric: She has a normal mood and affect. Her behavior is normal. Judgment and thought content normal.  Vitals reviewed.     BP 129/73 mmHg  Pulse 76  Temp(Src) 97.1 F (36.2 C) (Oral)  Ht 5' (1.524 m)  Wt 171 lb 9.6 oz (77.837 kg)  BMI 33.51 kg/m2     Assessment & Plan:  1. Cellulitis of face -Continue doxycycline 100 mg BID -Dicussed importance if warmth, redness, or fever increases to RTO -IF blisters appear will treat for shingles -RTO in 7 days    Evelina Dun, FNP

## 2015-01-30 ENCOUNTER — Other Ambulatory Visit: Payer: Self-pay | Admitting: Family

## 2015-02-03 ENCOUNTER — Ambulatory Visit: Payer: Self-pay | Admitting: Family

## 2015-02-06 ENCOUNTER — Ambulatory Visit (INDEPENDENT_AMBULATORY_CARE_PROVIDER_SITE_OTHER): Payer: Medicare Other | Admitting: Family

## 2015-02-06 ENCOUNTER — Encounter: Payer: Self-pay | Admitting: Family

## 2015-02-06 VITALS — BP 117/66 | HR 74 | Temp 97.1°F | Ht 60.0 in | Wt 167.2 lb

## 2015-02-06 DIAGNOSIS — L03211 Cellulitis of face: Secondary | ICD-10-CM | POA: Diagnosis not present

## 2015-02-06 NOTE — Progress Notes (Signed)
   Subjective:    Patient ID: Shirley Brown, female    DOB: 1940/09/05, 75 y.o.   MRN: [id number]  HPI Pt presents to the office today to recheck cellulitis of right side of her face. Pt states this started on 01/20/15 and went to the Urgent Care. PT states she was given doxycycline 100 mg BID for 10 days and was given a Rocephin IM injection. PT reports feeling better today and states the redness and pain is gone. Pt denies any pain, swelling, redness at this time.   Review of Systems  Constitutional: Negative.   HENT: Negative.   Eyes: Negative.   Respiratory: Negative.  Negative for shortness of breath.   Cardiovascular: Negative.  Negative for palpitations.  Gastrointestinal: Negative.   Endocrine: Negative.   Genitourinary: Negative.   Musculoskeletal: Negative.   Neurological: Negative.  Negative for headaches.  Hematological: Negative.   Psychiatric/Behavioral: Negative.   All other systems reviewed and are negative.      Objective:   Physical Exam  Constitutional: She is oriented to person, place, and time. She appears well-developed and well-nourished. No distress.  HENT:  Head: Normocephalic and atraumatic.  Right Ear: External ear normal.  Left Ear: External ear normal.  Nose: Nose normal.  Mouth/Throat: Oropharynx is clear and moist.  Eyes: Pupils are equal, round, and reactive to light.  Neck: Normal range of motion. Neck supple. No thyromegaly present.  Cardiovascular: Normal rate, regular rhythm, normal heart sounds and intact distal pulses.   No murmur heard. Pulmonary/Chest: Effort normal and breath sounds normal. No respiratory distress. She has no wheezes.  Abdominal: Soft. Bowel sounds are normal. She exhibits no distension. There is no tenderness.  Musculoskeletal: Normal range of motion. She exhibits no edema or tenderness.  Neurological: She is alert and oriented to person, place, and time. She has normal reflexes. No cranial nerve deficit.  Skin: Skin  is warm and dry.  Psychiatric: She has a normal mood and affect. Her behavior is normal. Judgment and thought content normal.  Vitals reviewed.     BP 117/66 mmHg  Pulse 74  Temp(Src) 97.1 F (36.2 C) (Oral)  Ht 5' (1.524 m)  Wt 167 lb 3.2 oz (75.841 kg)  BMI 32.65 kg/m2     Assessment & Plan:  1. Cellulitis of face -Resolved -If any redness, swelling, or warmth starts RTO!!! -Warm cloth as needed for comfort  Evelina Dun, FNP

## 2015-02-06 NOTE — Patient Instructions (Signed)

## 2015-02-16 ENCOUNTER — Other Ambulatory Visit: Payer: Self-pay | Admitting: Family

## 2015-04-14 ENCOUNTER — Ambulatory Visit: Payer: Medicare Other | Admitting: Family

## 2015-04-14 ENCOUNTER — Encounter (HOSPITAL_COMMUNITY): Payer: Medicare Other

## 2015-04-14 DIAGNOSIS — Z1231 Encounter for screening mammogram for malignant neoplasm of breast: Secondary | ICD-10-CM | POA: Diagnosis not present

## 2015-04-14 LAB — HM MAMMOGRAPHY

## 2015-04-22 ENCOUNTER — Other Ambulatory Visit: Payer: Self-pay | Admitting: Family

## 2015-05-06 ENCOUNTER — Encounter: Payer: Self-pay | Admitting: *Deleted

## 2015-05-06 ENCOUNTER — Encounter: Payer: Self-pay | Admitting: Nurse Practitioner

## 2015-05-06 ENCOUNTER — Other Ambulatory Visit: Payer: Self-pay | Admitting: Family Medicine

## 2015-05-06 ENCOUNTER — Ambulatory Visit (INDEPENDENT_AMBULATORY_CARE_PROVIDER_SITE_OTHER): Payer: Medicare Other | Admitting: Nurse Practitioner

## 2015-05-06 VITALS — BP 130/64 | HR 69 | Temp 97.3°F | Ht 60.0 in | Wt 170.4 lb

## 2015-05-06 DIAGNOSIS — S90921A Unspecified superficial injury of right foot, initial encounter: Secondary | ICD-10-CM | POA: Diagnosis not present

## 2015-05-06 DIAGNOSIS — W57XXXA Bitten or stung by nonvenomous insect and other nonvenomous arthropods, initial encounter: Secondary | ICD-10-CM

## 2015-05-06 MED ORDER — DOXYCYCLINE HYCLATE 100 MG PO TABS: 100.0000 mg | ORAL_TABLET | Freq: Two times a day (BID) | ORAL | Status: DC

## 2015-05-06 NOTE — Patient Instructions (Signed)
Insect Bite Mosquitoes, flies, fleas, bedbugs, and many other insects can bite. Insect bites are different from insect stings. A sting is when poison (venom) is injected into the skin. Insect bites can cause pain or itching for a few days, but they are usually not serious. Some insects can spread diseases to people through a bite. SYMPTOMS  Symptoms of an insect bite include:  Itching or pain in the bite area.  Redness and swelling in the bite area.  An open wound (skin ulcer). In many cases, symptoms last for 2-4 days.  DIAGNOSIS  This condition is usually diagnosed based on symptoms and a physical exam. TREATMENT  Treatment is usually not needed for an insect bite. Symptoms often go away on their own. Your health care provider may recommend creams or lotions to help reduce itching. Antibiotic medicines may be prescribed if the bite becomes infected. A tetanus shot may be given in some cases. If you develop an allergic reaction to an insect bite, your health care provider will prescribe medicines to treat the reaction (antihistamines). This is rare. HOME CARE INSTRUCTIONS  Do not scratch the bite area.  Keep the bite area clean and dry. Wash the bite area daily with soap and water as told by your health care provider.  If directed, applyice to the bite area.  Put ice in a plastic bag.  Place a towel between your skin and the bag.  Leave the ice on for 20 minutes, 2-3 times per day.  To help reduce itching and swelling, try applying a baking soda paste, cortisone cream, or calamine lotion to the bite area as told by your health care provider.  Apply or take over-the-counter and prescription medicines only as told by your health care provider.  If you were prescribed an antibiotic medicine, use it as told by your health care provider. Do not stop using the antibiotic even if your condition improves.  Keep all follow-up visits as told by your health care provider. This is  important. PREVENTION   Use insect repellent. The best insect repellents contain:  DEET, picaridin, oil of lemon eucalyptus (OLE), or IR3535.  Higher amounts of an active ingredient.  When you are outdoors, wear clothing that covers your arms and legs.  Avoid opening windows that do not have window screens. SEEK MEDICAL CARE IF:  You have increased redness, swelling, or pain in the bite area.  You have a fever. SEEK IMMEDIATE MEDICAL CARE IF:   You have joint pain.   You have fluid, blood, or pus coming from the bite area.  You have a headache or neck pain.  You have unusual weakness.  You have a rash.  You have chest pain or shortness of breath.  You have abdominal pain, nausea, or vomiting.  You feel unusually tired or sleepy.   This information is not intended to replace advice given to you by your health care provider. Make sure you discuss any questions you have with your health care provider.   Document Released: 02/18/2004 Document Revised: 10/01/2014 Document Reviewed: 05/28/2014 Elsevier Interactive Patient Education 2016 [employer]  

## 2015-05-06 NOTE — Telephone Encounter (Signed)
1 month only, needs to be seen.

## 2015-05-06 NOTE — Progress Notes (Signed)
   Subjective:    Patient ID: Shirley Brown, female    DOB: August 02, 1940, 74 y.o.   MRN: [id number]  HPI Patient in today c/o of some type of bite on right foot. Was bitten 5-6 days ago and area is getting progressively larger.    Review of Systems  Constitutional: Negative.   HENT: Negative.   Respiratory: Negative.   Cardiovascular: Negative.   Genitourinary: Negative.   Neurological: Negative.   Psychiatric/Behavioral: Negative.   All other systems reviewed and are negative.      Objective:   Physical Exam  Constitutional: She is oriented to person, place, and time. She appears well-developed and well-nourished. No distress.  Cardiovascular: Normal rate, regular rhythm and normal heart sounds.   Pulmonary/Chest: Effort normal.  Neurological: She is alert and oriented to person, place, and time.  Skin: Skin is warm. There is erythema (10cm annular area with lower border bite mark around 6 oclock position. mldly tender but cool to touch).  Psychiatric: She has a normal mood and affect. Her behavior is normal. Judgment and thought content normal.    BP 130/64 mmHg  Pulse 69  Temp(Src) 97.3 F (36.3 C) (Oral)  Ht 5' (1.524 m)  Wt 170 lb 6.4 oz (77.293 kg)  BMI 33.28 kg/m2       Assessment & Plan:   1. Bug bite    Meds ordered this encounter  Medications  . doxycycline (VIBRA-TABS) 100 MG tablet    Sig: Take 1 tablet (100 mg total) by mouth 2 (two) times daily. 1 po bid    Dispense:  20 tablet    Refill:  0    Order Specific Question:  Supervising Provider    Answer:  Chipper Herb [[id number]]   Avoid scratching or picking at area RTO  If not improving  Mary-Margaret Hassell Done, FNP

## 2015-05-06 NOTE — Telephone Encounter (Signed)
Authorize 30 days only. Then contact the patient letting them know that they will need an appointment before any further prescriptions can be sent in. 

## 2015-05-07 NOTE — Telephone Encounter (Signed)
RX for Ambien called into [facility] Okayed per Dr Livia Snellen

## 2015-05-13 ENCOUNTER — Ambulatory Visit: Payer: Self-pay | Admitting: Family Medicine

## 2015-05-14 ENCOUNTER — Encounter: Payer: Self-pay | Admitting: Family

## 2015-05-18 ENCOUNTER — Encounter: Payer: Self-pay | Admitting: Family Medicine

## 2015-05-18 ENCOUNTER — Ambulatory Visit (INDEPENDENT_AMBULATORY_CARE_PROVIDER_SITE_OTHER): Payer: Medicare Other | Admitting: Family Medicine

## 2015-05-18 ENCOUNTER — Other Ambulatory Visit: Payer: Self-pay | Admitting: *Deleted

## 2015-05-18 VITALS — BP 124/62 | HR 71 | Temp 97.9°F | Ht 60.0 in | Wt 167.6 lb

## 2015-05-18 DIAGNOSIS — H8110 Benign paroxysmal vertigo, unspecified ear: Secondary | ICD-10-CM

## 2015-05-18 DIAGNOSIS — J309 Allergic rhinitis, unspecified: Secondary | ICD-10-CM | POA: Diagnosis not present

## 2015-05-18 DIAGNOSIS — E785 Hyperlipidemia, unspecified: Secondary | ICD-10-CM | POA: Diagnosis not present

## 2015-05-18 DIAGNOSIS — K219 Gastro-esophageal reflux disease without esophagitis: Secondary | ICD-10-CM | POA: Diagnosis not present

## 2015-05-18 DIAGNOSIS — I10 Essential (primary) hypertension: Secondary | ICD-10-CM

## 2015-05-18 MED ORDER — ZOLPIDEM TARTRATE 10 MG PO TABS: ORAL_TABLET | ORAL | Status: DC

## 2015-05-18 MED ORDER — PRAVASTATIN SODIUM 10 MG PO TABS: 10.0000 mg | ORAL_TABLET | Freq: Every day | ORAL | Status: DC

## 2015-05-18 MED ORDER — LEVOCETIRIZINE DIHYDROCHLORIDE 5 MG PO TABS: 5.0000 mg | ORAL_TABLET | Freq: Every evening | ORAL | Status: DC

## 2015-05-18 MED ORDER — BETAMETHASONE SOD PHOS & ACET 6 (3-3) MG/ML IJ SUSP
6.0000 mg | Freq: Once | INTRAMUSCULAR | Status: AC
  Administered 2015-05-18: 6 mg via INTRAMUSCULAR

## 2015-05-18 MED ORDER — PANTOPRAZOLE SODIUM 40 MG PO TBEC: 40.0000 mg | DELAYED_RELEASE_TABLET | Freq: Two times a day (BID) | ORAL | Status: DC

## 2015-05-18 MED ORDER — ESCITALOPRAM OXALATE 20 MG PO TABS: 20.0000 mg | ORAL_TABLET | Freq: Every day | ORAL | Status: DC

## 2015-05-18 MED ORDER — FENOFIBRATE 145 MG PO TABS: 145.0000 mg | ORAL_TABLET | Freq: Every day | ORAL | Status: DC

## 2015-05-18 NOTE — Progress Notes (Signed)
Subjective:  Patient ID: Shirley Brown, female    DOB: 1940-05-18  Age: 75 y.o. MRN: [id number]  CC: Hypertension; Hyperlipidemia; Gastroesophageal Reflux; and GAD   HPI Shirley Brown presents for  follow-up of hypertension. Patient has no history of headache chest pain or shortness of breath or recent cough. Patient also denies symptoms of TIA such as numbness weakness lateralizing. Patient checks  blood pressure at home and has not had any elevated readings recently. Patient denies side effects from his medication. States taking it regularly.  Patient also  in for follow-up of elevated cholesterol. Doing well without complaints on current medication. Denies side effects of statin including myalgia and arthralgia and nausea. Also in today for liver function testing. Currently no chest pain, shortness of breath or other cardiovascular related symptoms noted.  Had vertigo while taking doxy.  Also made her vomit. Foot got well, so she stopped taking the doxycycline. Vertigo went away a day later. 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   She has past surgical history that includes Cholecystectomy; Joint replacement (Right, 1995); Joint replacement (Left, 2001); Appendectomy; and Throat surgery (Left).   Her family history includes  Cancer in her brother; Diabetes in her son; Heart attack in her father; Heart disease in her father and sister; Hyperlipidemia in her father, sister, and son; Hypertension in her father and sister; Other in her sister.She reports that she quit smoking about 22 years ago. Her smoking use included Cigarettes. She has never used smokeless tobacco. She reports that she does not drink alcohol or use illicit drugs.  Current Outpatient Prescriptions on File Prior to Visit  Medication Sig Dispense Refill  . acyclovir (ZOVIRAX) 400 MG tablet Take 1 tablet (400 mg total) by mouth daily. 30 tablet 2  . amLODipine (NORVASC) 10 MG tablet Take 1 tablet (10 mg total) by mouth daily. 90 tablet 3  . aspirin 81 MG tablet Take 81 mg by mouth daily.    . clotrimazole-betamethasone (LOTRISONE) cream Apply 1 application topically 2 (two) times daily. 30 g 6  . Cyanocobalamin (VITAMIN B 12 PO) Take by mouth.    . fish oil-omega-3 fatty acids 1000 MG capsule Take 1,200 mg by mouth daily.     . folic acid (FOLVITE) 1 MG tablet Take 1 tablet (1 mg total) by mouth daily. 90 tablet 1  . vitamin E 200 UNIT capsule Take 400 Units by mouth daily.     . Cholecalciferol (VITAMIN D3) 3000 UNITS TABS Take by mouth.    . doxycycline (VIBRA-TABS) 100 MG tablet Take 1 tablet (100 mg total) by mouth 2 (two) times daily. 1 po bid (Patient  not taking: Reported on 05/18/2015) 20 tablet 0   No current facility-administered medications on file prior to visit.    ROS Review of Systems  Constitutional: Negative for fever, activity change and appetite change.  HENT: Positive for postnasal drip, rhinorrhea and sneezing. Negative for congestion and sore throat.   Eyes: Negative for visual disturbance.  Respiratory: Negative for cough and shortness of breath.   Cardiovascular: Negative for chest pain and palpitations.  Gastrointestinal: Negative for nausea, abdominal pain and diarrhea.  Genitourinary: Negative for dysuria.    Musculoskeletal: Negative for myalgias and arthralgias.    Objective:  BP 124/62 mmHg  Pulse 71  Temp(Src) 97.9 F (36.6 C) (Oral)  Ht 5' (1.524 m)  Wt 167 lb 9.6 oz (76.023 kg)  BMI 32.73 kg/m2  SpO2 99%  BP Readings from Last 3 Encounters:  05/18/15 124/62  05/06/15 130/64  02/06/15 117/66    Wt Readings from Last 3 Encounters:  05/18/15 167 lb 9.6 oz (76.023 kg)  05/06/15 170 lb 6.4 oz (77.293 kg)  02/06/15 167 lb 3.2 oz (75.841 kg)     Physical Exam  Constitutional: She is oriented to person, place, and time. She appears well-developed and well-nourished. No distress.  HENT:  Head: Normocephalic and atraumatic.  Right Ear: External ear normal.  Left Ear: External ear normal.  Nose: Nose normal.  Mouth/Throat: Oropharynx is clear and moist.  Eyes: Conjunctivae and EOM are normal. Pupils are equal, round, and reactive to light.  Neck: Normal range of motion. Neck supple. No thyromegaly present.  Cardiovascular: Normal rate, regular rhythm and normal heart sounds.   No murmur heard. Pulmonary/Chest: Effort normal and breath sounds normal. No respiratory distress. She has no wheezes. She has no rales.  Abdominal: Soft. Bowel sounds are normal. She exhibits no distension. There is no tenderness.  Lymphadenopathy:    She has no cervical adenopathy.  Neurological: She is alert and oriented to person, place, and time. She has normal reflexes.  Skin: Skin is warm and dry.  Psychiatric: She has a normal mood and affect. Her behavior is normal. Judgment and thought content normal.    Lab Results  Component Value Date   HGBA1C 5.4 % 08/03/2012   HGBA1C 4.9 04/24/2012    Lab Results  Component Value Date   WBC 5.0 10/13/2014   HGB 10.1* 06/06/2014   HCT 31.6* 10/13/2014   PLT 281 10/13/2014   GLUCOSE 94 10/13/2014   CHOL 162 10/13/2014   TRIG 118 10/13/2014   HDL 47 10/13/2014   LDLCALC 91 10/13/2014   ALT 9 10/13/2014   AST 32 10/13/2014   NA 138 10/13/2014    K 4.0 10/13/2014   CL 97 10/13/2014   CREATININE 1.00 10/13/2014   BUN 14 10/13/2014   CO2 24 10/13/2014   TSH 2.590 02/03/2014   HGBA1C 5.4 % 08/03/2012    No results found.  Assessment & Plan:   Shirley Brown was seen today for hypertension, hyperlipidemia, gastroesophageal reflux and gad.  Diagnoses and all orders for this visit:  Allergic rhinitis, unspecified allergic rhinitis type -     betamethasone acetate-betamethasone sodium phosphate (CELESTONE) injection 6 mg; Inject 1 mL (6 mg total) into the muscle once. -     CBC with Differential/Platelet -     CMP14+EGFR  Essential hypertension -     CBC with Differential/Platelet -     CMP14+EGFR  Gastroesophageal reflux disease without esophagitis -     CBC with Differential/Platelet -  CMP14+EGFR  Benign paroxysmal positional vertigo, unspecified laterality -     CBC with Differential/Platelet -     CMP14+EGFR  HLD (hyperlipidemia) -     CBC with Differential/Platelet -     CMP14+EGFR -     Lipid panel  Other orders -     levocetirizine (XYZAL) 5 MG tablet; Take 1 tablet (5 mg total) by mouth every evening.   I am having Ms. Dolley start on levocetirizine. I am also having her maintain her fish oil-omega-3 fatty acids, Cyanocobalamin (VITAMIN B 12 PO), Vitamin D3, aspirin, vitamin E, acyclovir, clotrimazole-betamethasone, folic acid, amLODipine, and doxycycline. We administered betamethasone acetate-betamethasone sodium phosphate.  Meds ordered this encounter  Medications  . levocetirizine (XYZAL) 5 MG tablet    Sig: Take 1 tablet (5 mg total) by mouth every evening.    Dispense:  30 tablet    Refill:  11  . betamethasone acetate-betamethasone sodium phosphate (CELESTONE) injection 6 mg    Sig:      Follow-up: Return in about 6 months (around 11/17/2015).  Claretta Fraise, M.D.

## 2015-05-19 ENCOUNTER — Other Ambulatory Visit: Payer: Self-pay | Admitting: Family Medicine

## 2015-05-19 LAB — CBC WITH DIFFERENTIAL/PLATELET
BASOS ABS: 0 10*3/uL (ref 0.0–0.2)
Basos: 1 %
EOS (ABSOLUTE): 0.2 10*3/uL (ref 0.0–0.4)
Eos: 4 %
Hematocrit: 32 % — ABNORMAL LOW (ref 34.0–46.6)
Hemoglobin: 10.6 g/dL — ABNORMAL LOW (ref 11.1–15.9)
Immature Grans (Abs): 0 10*3/uL (ref 0.0–0.1)
Immature Granulocytes: 0 %
LYMPHS ABS: 2.1 10*3/uL (ref 0.7–3.1)
Lymphs: 37 %
MCH: 29.4 pg (ref 26.6–33.0)
MCHC: 33.1 g/dL (ref 31.5–35.7)
MCV: 89 fL (ref 79–97)
MONOS ABS: 0.6 10*3/uL (ref 0.1–0.9)
Monocytes: 11 %
Neutrophils Absolute: 2.7 10*3/uL (ref 1.4–7.0)
Neutrophils: 47 %
PLATELETS: 251 10*3/uL (ref 150–379)
RBC: 3.61 x10E6/uL — AB (ref 3.77–5.28)
RDW: 14.3 % (ref 12.3–15.4)
WBC: 5.7 10*3/uL (ref 3.4–10.8)

## 2015-05-19 LAB — LIPID PANEL
Chol/HDL Ratio: 2.6 ratio units (ref 0.0–4.4)
Cholesterol, Total: 147 mg/dL (ref 100–199)
HDL: 56 mg/dL (ref >39)
LDL Calculated: 74 mg/dL (ref 0–99)
TRIGLYCERIDES: 85 mg/dL (ref 0–149)
VLDL CHOLESTEROL CAL: 17 mg/dL (ref 5–40)

## 2015-05-19 LAB — CMP14+EGFR
A/G RATIO: 1.8 (ref 1.2–2.2)
ALT: 8 IU/L (ref 0–32)
AST: 26 IU/L (ref 0–40)
Albumin: 4.7 g/dL (ref 3.5–4.8)
Alkaline Phosphatase: 46 IU/L (ref 39–117)
BILIRUBIN TOTAL: 0.5 mg/dL (ref 0.0–1.2)
BUN/Creatinine Ratio: 18 (ref 12–28)
BUN: 23 mg/dL (ref 8–27)
CALCIUM: 9.9 mg/dL (ref 8.7–10.3)
CHLORIDE: 96 mmol/L (ref 96–106)
CO2: 24 mmol/L (ref 18–29)
Creatinine, Ser: 1.26 mg/dL — ABNORMAL HIGH (ref 0.57–1.00)
GFR calc non Af Amer: 42 mL/min/{1.73_m2} — ABNORMAL LOW (ref >59)
GFR, EST AFRICAN AMERICAN: 48 mL/min/{1.73_m2} — AB (ref >59)
GLUCOSE: 87 mg/dL (ref 65–99)
Globulin, Total: 2.6 g/dL (ref 1.5–4.5)
POTASSIUM: 4.2 mmol/L (ref 3.5–5.2)
Sodium: 137 mmol/L (ref 134–144)
TOTAL PROTEIN: 7.3 g/dL (ref 6.0–8.5)

## 2015-05-19 MED ORDER — FENOFIBRATE 160 MG PO TABS: 160.0000 mg | ORAL_TABLET | Freq: Every day | ORAL | Status: DC

## 2015-06-12 ENCOUNTER — Other Ambulatory Visit: Payer: Self-pay | Admitting: Family Medicine

## 2015-06-12 NOTE — Telephone Encounter (Signed)
Last seen 05/18/15  Dr Currie Paris PCP   If approved route to nurse [facility]

## 2015-08-14 ENCOUNTER — Telehealth: Payer: Self-pay | Admitting: Family Medicine

## 2015-08-14 NOTE — Telephone Encounter (Signed)
Okay at current level for 3 mos. Thanks ws 

## 2015-08-14 NOTE — Telephone Encounter (Signed)
Pt has follow up appt in August Needs refill on Ambien until appt Please review and advise

## 2015-08-15 MED ORDER — ZOLPIDEM TARTRATE 10 MG PO TABS: 10.0000 mg | ORAL_TABLET | Freq: Every evening | ORAL | Status: DC | PRN

## 2015-08-15 NOTE — Telephone Encounter (Signed)
Prescription was sent to [facility], patient informed

## 2015-08-31 ENCOUNTER — Other Ambulatory Visit: Payer: Self-pay | Admitting: Family Medicine

## 2015-09-25 ENCOUNTER — Other Ambulatory Visit: Payer: Self-pay

## 2015-09-25 MED ORDER — FOLIC ACID 1 MG PO TABS: 1.0000 mg | ORAL_TABLET | Freq: Every day | ORAL | 0 refills | Status: DC

## 2015-10-16 ENCOUNTER — Other Ambulatory Visit: Payer: Self-pay | Admitting: Family

## 2015-10-16 ENCOUNTER — Other Ambulatory Visit: Payer: Self-pay

## 2015-10-16 ENCOUNTER — Other Ambulatory Visit: Payer: Self-pay | Admitting: Family Medicine

## 2015-10-19 ENCOUNTER — Other Ambulatory Visit: Payer: Self-pay

## 2015-10-19 MED ORDER — PANTOPRAZOLE SODIUM 40 MG PO TBEC: 40.0000 mg | DELAYED_RELEASE_TABLET | Freq: Two times a day (BID) | ORAL | 0 refills | Status: DC

## 2015-11-17 ENCOUNTER — Ambulatory Visit (INDEPENDENT_AMBULATORY_CARE_PROVIDER_SITE_OTHER): Payer: Medicare Other | Admitting: Family Medicine

## 2015-11-17 ENCOUNTER — Encounter: Payer: Self-pay | Admitting: Family Medicine

## 2015-11-17 VITALS — BP 129/62 | HR 75 | Temp 98.2°F | Ht 60.0 in | Wt 169.5 lb

## 2015-11-17 DIAGNOSIS — E559 Vitamin D deficiency, unspecified: Secondary | ICD-10-CM | POA: Diagnosis not present

## 2015-11-17 DIAGNOSIS — E782 Mixed hyperlipidemia: Secondary | ICD-10-CM

## 2015-11-17 DIAGNOSIS — F331 Major depressive disorder, recurrent, moderate: Secondary | ICD-10-CM

## 2015-11-17 DIAGNOSIS — I10 Essential (primary) hypertension: Secondary | ICD-10-CM

## 2015-11-17 DIAGNOSIS — K219 Gastro-esophageal reflux disease without esophagitis: Secondary | ICD-10-CM | POA: Diagnosis not present

## 2015-11-17 MED ORDER — DULOXETINE HCL 30 MG PO CPEP
30.0000 mg | ORAL_CAPSULE | Freq: Every day | ORAL | 0 refills | Status: DC
Start: 2015-11-17 — End: 2015-12-22

## 2015-11-17 NOTE — Progress Notes (Signed)
Subjective:  Patient ID: Shirley Brown, female    DOB: March 29, 1940  Age: 75 y.o. MRN: [id number]  CC: 6 month follow up (pt here today for routine follow up, but also c/o productive cough especially late in the evenings)   HPI Ayala P. Dowse presents for  follow-up of hypertension. Patient has no history of headache chest pain or shortness of breath or recent cough. Patient also denies symptoms of TIA such as numbness weakness lateralizing. Patient checks  blood pressure at home and has not had any elevated readings recently. Patient denies side effects from his medication. States taking it regularly.  Patient also  in for follow-up of elevated cholesterol. Doing well without complaints on current medication. Denies side effects of statin including myalgia and arthralgia and nausea. Also in today for liver function testing. Currently no chest pain, shortness of breath or other cardiovascular related symptoms noted.  Patient states, ""I'm always depressed." She doesn't know why. No particular events she just feels down and blue. See PHQ below. She is taking her escitalopram, and it agrees with her it just doesn't seem to be effective.  Depression screen [facility] 2/9 11/17/2015 05/18/2015 05/06/2015 01/22/2015 11/12/2014  Decreased Interest 2 0 0 0 1  Down, Depressed, Hopeless 3 0 0 0 1  PHQ - 2 Score 5 0 0 0 2  Altered sleeping 0 - - - 1  Tired, decreased energy 1 - - - 1  Change in appetite 0 - - - 0  Feeling bad or failure about yourself  0 - - - 0  Trouble concentrating 1 - - - 0  Moving slowly or fidgety/restless 3 - - - 0  Suicidal thoughts 0 - - - 0  PHQ-9 Score 10 - - - 4  Difficult doing work/chores Not difficult at all - - - -     History Rainelle has a past medical history of Allergy to meropenem; Anemia; Arthritis; Cancer ([place]); 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   She has a past surgical history that includes Cholecystectomy;  Joint replacement (Right, 1995); Joint replacement (Left, 2001); Appendectomy; and Throat surgery (Left).   Her family history includes Cancer in her brother; Diabetes in her son; Heart attack in her father; Heart disease in her father and sister; Hyperlipidemia in her father, sister, and son; Hypertension in her father and sister; Other in her sister.She reports that she quit smoking about 22 years ago. Her smoking use included Cigarettes. She has never used smokeless tobacco. She reports that she does not drink alcohol or use drugs.  Current Outpatient Prescriptions on File Prior to Visit  Medication Sig Dispense Refill  . acyclovir (ZOVIRAX) 400 MG tablet Take 1 tablet (400 mg total) by mouth daily. 30 tablet 2  . amLODipine (NORVASC) 5 MG tablet Take 1 Tablet by mouth once daily 90 tablet 0  . aspirin 81 MG tablet Take 81 mg by mouth daily.    . Cholecalciferol (VITAMIN D3) 3000 UNITS TABS Take by mouth.    . clotrimazole-betamethasone (LOTRISONE) cream Apply 1 application topically 2 (two) times daily. 30 g 6  . Cyanocobalamin (VITAMIN B 12 PO) Take by mouth.    . fenofibrate 160 MG tablet Take 1 tablet (160 mg total) by mouth daily. For cholesterol and triglyceride 90 tablet 3  . fish oil-omega-3 fatty acids 1000 MG capsule Take 1,200 mg by mouth daily.     . folic acid (FOLVITE) 1 MG tablet Take 1 tablet (1  mg total) by mouth daily. 90 tablet 0  . levocetirizine (XYZAL) 5 MG tablet Take 1 tablet (5 mg total) by mouth every evening. 30 tablet 11  . pantoprazole (PROTONIX) 40 MG tablet Take 1 tablet (40 mg total) by mouth 2 (two) times daily. 180 tablet 0  . pravastatin (PRAVACHOL) 10 MG tablet TAKE ONE TABLET BY MOUTH AT bedtime 90 tablet 1  . vitamin E 200 UNIT capsule Take 400 Units by mouth daily.     Marland Kitchen zolpidem (AMBIEN) 10 MG tablet Take 1 tablet (10 mg total) by mouth at bedtime as needed for sleep. 30 tablet 2  . amLODipine (NORVASC) 10 MG tablet Take 1 tablet (10 mg total) by mouth  daily. 90 tablet 3   No current facility-administered medications on file prior to visit.     ROS Review of Systems  Constitutional: Negative for activity change, appetite change and fever.  HENT: Negative for congestion, rhinorrhea and sore throat.   Eyes: Negative for visual disturbance.  Respiratory: Negative for cough and shortness of breath.   Cardiovascular: Negative for chest pain and palpitations.  Gastrointestinal: Negative for abdominal pain, diarrhea and nausea.  Genitourinary: Negative for dysuria.  Musculoskeletal: Negative for arthralgias and myalgias.  Psychiatric/Behavioral: Positive for dysphoric mood.    Objective:  BP 129/62   Pulse 75   Temp 98.2 F (36.8 C) (Oral)   Ht 5' (1.524 m)   Wt 169 lb 8 oz (76.9 kg)   BMI 33.10 kg/m   BP Readings from Last 3 Encounters:  11/17/15 129/62  05/18/15 124/62  05/06/15 130/64    Wt Readings from Last 3 Encounters:  11/17/15 169 lb 8 oz (76.9 kg)  05/18/15 167 lb 9.6 oz (76 kg)  05/06/15 170 lb 6.4 oz (77.3 kg)     Physical Exam  Constitutional: She is oriented to person, place, and time. She appears well-developed and well-nourished. No distress.  HENT:  Head: Normocephalic and atraumatic.  Right Ear: External ear normal.  Left Ear: External ear normal.  Nose: Nose normal.  Mouth/Throat: Oropharynx is clear and moist.  Eyes: Conjunctivae and EOM are normal. Pupils are equal, round, and reactive to light.  Neck: Normal range of motion. Neck supple. No thyromegaly present.  Cardiovascular: Normal rate, regular rhythm and normal heart sounds.   No murmur heard. Pulmonary/Chest: Effort normal and breath sounds normal. No respiratory distress. She has no wheezes. She has no rales.  Abdominal: Soft. Bowel sounds are normal. She exhibits no distension. There is no tenderness.  Lymphadenopathy:    She has no cervical adenopathy.  Neurological: She is alert and oriented to person, place, and time. She has normal  reflexes.  Skin: Skin is warm and dry.  Psychiatric: She has a normal mood and affect. Her behavior is normal. Judgment and thought content normal.    Lab Results  Component Value Date   HGBA1C 5.4 % 08/03/2012   HGBA1C 4.9 04/24/2012    Lab Results  Component Value Date   WBC 5.7 05/18/2015   HGB 10.1 (A) 06/06/2014   HCT 32.0 (L) 05/18/2015   PLT 251 05/18/2015   GLUCOSE 87 05/18/2015   CHOL 147 05/18/2015   TRIG 85 05/18/2015   HDL 56 05/18/2015   LDLCALC 74 05/18/2015   ALT 8 05/18/2015   AST 26 05/18/2015   NA 137 05/18/2015   K 4.2 05/18/2015   CL 96 05/18/2015   CREATININE 1.26 (H) 05/18/2015   BUN 23 05/18/2015   CO2  24 05/18/2015   TSH 2.590 02/03/2014   HGBA1C 5.4 % 08/03/2012    No results found.  Assessment & Plan:   Tinesha was seen today for 6 month follow up.  Diagnoses and all orders for this visit:  Essential hypertension -     CBC with Differential/Platelet -     CMP14+EGFR  Mixed hyperlipidemia -     CBC with Differential/Platelet -     CMP14+EGFR -     Lipid panel  Gastroesophageal reflux disease without esophagitis -     CBC with Differential/Platelet -     CMP14+EGFR  Vitamin D deficiency -     CBC with Differential/Platelet -     CMP14+EGFR -     VITAMIN D 25 Hydroxy (Vit-D Deficiency, Fractures)  Moderate episode of recurrent major depressive disorder ([place])  Other orders -     DULoxetine (CYMBALTA) 30 MG capsule; Take 1 capsule (30 mg total) by mouth daily. For one week then two daily. Take with a full stomach at suppertime   I have discontinued Ms. Presutti's doxycycline and escitalopram. I am also having her start on DULoxetine. Additionally, I am having her maintain her fish oil-omega-3 fatty acids, Cyanocobalamin (VITAMIN B 12 PO), Vitamin D3, aspirin, vitamin E, acyclovir, clotrimazole-betamethasone, amLODipine, levocetirizine, fenofibrate, zolpidem, folic acid, amLODipine, pravastatin, and pantoprazole.  Meds ordered this  encounter  Medications  . DULoxetine (CYMBALTA) 30 MG capsule    Sig: Take 1 capsule (30 mg total) by mouth daily. For one week then two daily. Take with a full stomach at suppertime    Dispense:  60 capsule    Refill:  0     Follow-up: Return in about 6 weeks (around 12/29/2015).  Claretta Fraise, M.D.

## 2015-11-18 ENCOUNTER — Telehealth: Payer: Self-pay | Admitting: Family Medicine

## 2015-11-18 ENCOUNTER — Other Ambulatory Visit: Payer: Self-pay | Admitting: Family Medicine

## 2015-11-18 LAB — CMP14+EGFR
A/G RATIO: 1.5 (ref 1.2–2.2)
ALK PHOS: 51 IU/L (ref 39–117)
ALT: 9 IU/L (ref 0–32)
AST: 21 IU/L (ref 0–40)
Albumin: 4.1 g/dL (ref 3.5–4.8)
BILIRUBIN TOTAL: 0.3 mg/dL (ref 0.0–1.2)
BUN/Creatinine Ratio: 15 (ref 12–28)
BUN: 17 mg/dL (ref 8–27)
CHLORIDE: 96 mmol/L (ref 96–106)
CO2: 27 mmol/L (ref 18–29)
Calcium: 9.7 mg/dL (ref 8.7–10.3)
Creatinine, Ser: 1.13 mg/dL — ABNORMAL HIGH (ref 0.57–1.00)
GFR calc non Af Amer: 48 mL/min/{1.73_m2} — ABNORMAL LOW (ref >59)
GFR, EST AFRICAN AMERICAN: 55 mL/min/{1.73_m2} — AB (ref >59)
GLUCOSE: 96 mg/dL (ref 65–99)
Globulin, Total: 2.7 g/dL (ref 1.5–4.5)
POTASSIUM: 4.7 mmol/L (ref 3.5–5.2)
Sodium: 137 mmol/L (ref 134–144)
Total Protein: 6.8 g/dL (ref 6.0–8.5)

## 2015-11-18 LAB — CBC WITH DIFFERENTIAL/PLATELET
BASOS ABS: 0.1 10*3/uL (ref 0.0–0.2)
BASOS: 1 %
EOS (ABSOLUTE): 0.2 10*3/uL (ref 0.0–0.4)
Eos: 4 %
Hematocrit: 29.4 % — ABNORMAL LOW (ref 34.0–46.6)
Hemoglobin: 10 g/dL — ABNORMAL LOW (ref 11.1–15.9)
Immature Grans (Abs): 0 10*3/uL (ref 0.0–0.1)
Immature Granulocytes: 0 %
Lymphocytes Absolute: 1.9 10*3/uL (ref 0.7–3.1)
Lymphs: 34 %
MCH: 29.9 pg (ref 26.6–33.0)
MCHC: 34 g/dL (ref 31.5–35.7)
MCV: 88 fL (ref 79–97)
MONOS ABS: 0.5 10*3/uL (ref 0.1–0.9)
Monocytes: 9 %
NEUTROS ABS: 3 10*3/uL (ref 1.4–7.0)
Neutrophils: 52 %
PLATELETS: 262 10*3/uL (ref 150–379)
RBC: 3.35 x10E6/uL — ABNORMAL LOW (ref 3.77–5.28)
RDW: 14.1 % (ref 12.3–15.4)
WBC: 5.8 10*3/uL (ref 3.4–10.8)

## 2015-11-18 LAB — LIPID PANEL
Chol/HDL Ratio: 3.1 ratio (ref 0.0–4.4)
Cholesterol, Total: 131 mg/dL (ref 100–199)
HDL: 42 mg/dL
LDL Calculated: 71 mg/dL (ref 0–99)
Triglycerides: 88 mg/dL (ref 0–149)
VLDL Cholesterol Cal: 18 mg/dL (ref 5–40)

## 2015-11-18 LAB — VITAMIN D 25 HYDROXY (VIT D DEFICIENCY, FRACTURES): VIT D 25 HYDROXY: 40.6 ng/mL (ref 30.0–100.0)

## 2015-11-18 NOTE — Telephone Encounter (Signed)
FYI Current dose of Amlodipine is 5mg 

## 2015-12-07 ENCOUNTER — Other Ambulatory Visit: Payer: Self-pay | Admitting: *Deleted

## 2015-12-07 MED ORDER — ZOLPIDEM TARTRATE 10 MG PO TABS: 10.0000 mg | ORAL_TABLET | Freq: Every evening | ORAL | 0 refills | Status: DC | PRN

## 2015-12-09 ENCOUNTER — Other Ambulatory Visit: Payer: Self-pay | Admitting: *Deleted

## 2015-12-22 ENCOUNTER — Other Ambulatory Visit: Payer: Self-pay | Admitting: Family Medicine

## 2015-12-23 DIAGNOSIS — D3130 Benign neoplasm of unspecified choroid: Secondary | ICD-10-CM | POA: Diagnosis not present

## 2015-12-23 DIAGNOSIS — H43813 Vitreous degeneration, bilateral: Secondary | ICD-10-CM | POA: Diagnosis not present

## 2015-12-23 DIAGNOSIS — H43393 Other vitreous opacities, bilateral: Secondary | ICD-10-CM | POA: Diagnosis not present

## 2015-12-23 DIAGNOSIS — H26492 Other secondary cataract, left eye: Secondary | ICD-10-CM | POA: Diagnosis not present

## 2015-12-26 ENCOUNTER — Other Ambulatory Visit: Payer: Self-pay | Admitting: Family Medicine

## 2015-12-28 ENCOUNTER — Telehealth: Payer: Self-pay | Admitting: Family Medicine

## 2015-12-28 ENCOUNTER — Ambulatory Visit (INDEPENDENT_AMBULATORY_CARE_PROVIDER_SITE_OTHER): Payer: Medicare Other | Admitting: Family Medicine

## 2015-12-28 ENCOUNTER — Encounter: Payer: Self-pay | Admitting: Family Medicine

## 2015-12-28 VITALS — BP 130/73 | HR 81 | Temp 98.1°F | Ht 60.0 in | Wt 174.0 lb

## 2015-12-28 DIAGNOSIS — F411 Generalized anxiety disorder: Secondary | ICD-10-CM

## 2015-12-28 DIAGNOSIS — I10 Essential (primary) hypertension: Secondary | ICD-10-CM

## 2015-12-28 DIAGNOSIS — G4700 Insomnia, unspecified: Secondary | ICD-10-CM | POA: Diagnosis not present

## 2015-12-28 MED ORDER — DULOXETINE HCL 60 MG PO CPEP: 60.0000 mg | ORAL_CAPSULE | Freq: Every day | ORAL | 5 refills | Status: DC

## 2015-12-28 MED ORDER — FLUOCINONIDE-E 0.05 % EX CREA: 1.0000 "application " | TOPICAL_CREAM | Freq: Two times a day (BID) | CUTANEOUS | 5 refills | Status: DC

## 2015-12-28 NOTE — Telephone Encounter (Signed)
Please review and advise.

## 2015-12-28 NOTE — Telephone Encounter (Signed)
[place] notified

## 2015-12-28 NOTE — Telephone Encounter (Signed)
Cymbalta 60 mg one daily is correct. She should DC Lexapro.

## 2015-12-28 NOTE — Progress Notes (Signed)
Subjective:  Patient ID: Shirley Brown, female    DOB: 06/16/1940  Age: 75 y.o. MRN: [id number]  CC: Follow-up ( 1 mos FU on cymbalta)   HPI Shirley Brown presents for Awakening 2-3 times a night started with use of cymbalta.  Depression screen [facility] 2/9 12/28/2015 11/17/2015 05/18/2015 05/06/2015 01/22/2015  Decreased Interest 1 2 0 0 0  Down, Depressed, Hopeless 1 3 0 0 0  PHQ - 2 Score 2 5 0 0 0  Altered sleeping 3 0 - - -  Tired, decreased energy 2 1 - - -  Change in appetite 0 0 - - -  Feeling bad or failure about yourself  0 0 - - -  Trouble concentrating 1 1 - - -  Moving slowly or fidgety/restless 0 3 - - -  Suicidal thoughts 0 0 - - -  PHQ-9 Score 8 10 - - -  Difficult doing work/chores - Not difficult at all - - -     History Shirley Brown has a past medical history of Allergy to meropenem; Anemia; Arthritis; Cancer ([place]); 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   She has a past surgical history that includes Cholecystectomy; Joint replacement (Right, 1995); Joint replacement (Left, 2001); Appendectomy; and Throat surgery (Left).   Her family history includes Cancer in her brother; Diabetes in her son; Heart attack in her father; Heart disease in her father and sister; Hyperlipidemia in her father, sister, and son; Hypertension in her father and sister; Other in her sister.She reports that she quit smoking about 22 years ago. Her smoking use included Cigarettes. She has never used smokeless tobacco. She reports that she does not drink alcohol or use drugs.    ROS Review of Systems  Constitutional: Negative for activity change, appetite change and fever.  HENT: Negative for congestion, rhinorrhea and sore throat.   Eyes: Negative for visual disturbance.  Respiratory: Negative for cough and shortness of breath.   Cardiovascular: Negative for chest pain and palpitations.  Gastrointestinal: Negative for abdominal pain, diarrhea  and nausea.  Genitourinary: Negative for dysuria.  Musculoskeletal: Negative for arthralgias and myalgias.    Objective:  BP 130/73   Pulse 81   Temp 98.1 F (36.7 C) (Oral)   Ht 5' (1.524 m)   Wt 174 lb (78.9 kg)   BMI 33.98 kg/m   BP Readings from Last 3 Encounters:  12/28/15 130/73  11/17/15 129/62  05/18/15 124/62    Wt Readings from Last 3 Encounters:  12/28/15 174 lb (78.9 kg)  11/17/15 169 lb 8 oz (76.9 kg)  05/18/15 167 lb 9.6 oz (76 kg)     Physical Exam  Constitutional: She is oriented to person, place, and time. She appears well-developed and well-nourished. No distress.  HENT:  Head: Normocephalic and atraumatic.  Eyes: Conjunctivae are normal. Pupils are equal, round, and reactive to light.  Neck: Normal range of motion. Neck supple. No thyromegaly present.  Cardiovascular: Normal rate, regular rhythm and normal heart sounds.   No murmur heard. Pulmonary/Chest: Effort normal and breath sounds normal. No respiratory distress. She has no wheezes. She has no rales.  Abdominal: Soft. Bowel sounds are normal. She exhibits no distension. There is no tenderness.  Musculoskeletal: Normal range of motion.  Lymphadenopathy:    She has no cervical adenopathy.  Neurological: She is alert and oriented to person, place, and time.  Skin: Skin is warm and dry.  Psychiatric: She has a normal mood and affect. Her  behavior is normal. Judgment and thought content normal.     Lab Results  Component Value Date   WBC 5.8 11/17/2015   HGB 10.1 (A) 06/06/2014   HCT 29.4 (L) 11/17/2015   PLT 262 11/17/2015   GLUCOSE 96 11/17/2015   CHOL 131 11/17/2015   TRIG 88 11/17/2015   HDL 42 11/17/2015   LDLCALC 71 11/17/2015   ALT 9 11/17/2015   AST 21 11/17/2015   NA 137 11/17/2015   K 4.7 11/17/2015   CL 96 11/17/2015   CREATININE 1.13 (H) 11/17/2015   BUN 17 11/17/2015   CO2 27 11/17/2015   TSH 2.590 02/03/2014   HGBA1C 5.4 % 08/03/2012    No results  found.  Assessment & Plan:   Shirley Brown was seen today for follow-up.  Diagnoses and all orders for this visit:  Essential hypertension  Generalized anxiety disorder  Insomnia, unspecified type  Other orders -     DULoxetine (CYMBALTA) 60 MG capsule; Take 1 capsule (60 mg total) by mouth daily. -     fluocinonide-emollient (LIDEX-E) 0.05 % cream; Apply 1 application topically 2 (two) times daily. To affected areas     I have discontinued Shirley Brown's pravastatin and escitalopram. I have also changed her DULoxetine. Additionally, I am having her start on fluocinonide-emollient. Lastly, I am having her maintain her fish oil-omega-3 fatty acids, Cyanocobalamin (VITAMIN B 12 PO), Vitamin D3, aspirin, vitamin E, acyclovir, clotrimazole-betamethasone, levocetirizine, fenofibrate, folic acid, amLODipine, pantoprazole, and zolpidem.  Meds ordered this encounter  Medications  . DISCONTD: escitalopram (LEXAPRO) 20 MG tablet    Sig: Take 1 tablet by mouth daily.  . DULoxetine (CYMBALTA) 60 MG capsule    Sig: Take 1 capsule (60 mg total) by mouth daily.    Dispense:  30 capsule    Refill:  5  . fluocinonide-emollient (LIDEX-E) 0.05 % cream    Sig: Apply 1 application topically 2 (two) times daily. To affected areas    Dispense:  60 g    Refill:  5     Follow-up: Return in about 3 months (around 03/27/2016).  Claretta Fraise, M.D.

## 2015-12-28 NOTE — Patient Instructions (Signed)
Take the cymbalta in the morning right after breakfast. Make sure you eat protein for breakfast.

## 2016-01-12 ENCOUNTER — Other Ambulatory Visit: Payer: Self-pay | Admitting: Family Medicine

## 2016-01-19 ENCOUNTER — Other Ambulatory Visit: Payer: Self-pay | Admitting: Family

## 2016-03-01 ENCOUNTER — Other Ambulatory Visit: Payer: Self-pay | Admitting: Family Medicine

## 2016-03-04 ENCOUNTER — Ambulatory Visit (INDEPENDENT_AMBULATORY_CARE_PROVIDER_SITE_OTHER): Payer: Medicare Other | Admitting: Pediatrics

## 2016-03-04 ENCOUNTER — Ambulatory Visit (INDEPENDENT_AMBULATORY_CARE_PROVIDER_SITE_OTHER): Payer: Medicare Other

## 2016-03-04 VITALS — BP 139/80 | HR 89 | Temp 98.0°F | Ht 60.0 in | Wt 175.4 lb

## 2016-03-04 DIAGNOSIS — F329 Major depressive disorder, single episode, unspecified: Secondary | ICD-10-CM

## 2016-03-04 DIAGNOSIS — M79672 Pain in left foot: Secondary | ICD-10-CM

## 2016-03-04 DIAGNOSIS — F32A Depression, unspecified: Secondary | ICD-10-CM

## 2016-03-04 NOTE — Progress Notes (Signed)
  Subjective:   Patient ID: Shirley Brown, female    DOB: 06/20/1940, 76 y.o.   MRN: [id number] CC: Foot Pain (Left for 2 weeks)  HPI: Shirley Brown is a 76 y.o. female presenting for Foot Pain (Left for 2 weeks)  Past two weeks the top of her L foot has been hurting, worse when walking Does not remember injuring it Hurts when top of her shoes presses on it when walking No redness, no swelling  Says she continues to be bothered by depression Present for years Has been tried on multiple medications No thoughts of self harm Wants to improve symptoms, says she is not sure about doing counseling  Depression screen [facility] 2/9 03/04/2016 12/28/2015 11/17/2015 05/18/2015 05/06/2015  Decreased Interest 0 1 2 0 0  Down, Depressed, Hopeless 0 1 3 0 0  PHQ - 2 Score 0 2 5 0 0  Altered sleeping - 3 0 - -  Tired, decreased energy - 2 1 - -  Change in appetite - 0 0 - -  Feeling bad or failure about yourself  - 0 0 - -  Trouble concentrating - 1 1 - -  Moving slowly or fidgety/restless - 0 3 - -  Suicidal thoughts - 0 0 - -  PHQ-9 Score - 8 10 - -  Difficult doing work/chores - - Not difficult at all - -     Relevant past medical, surgical, family and social history reviewed. Allergies and medications reviewed and updated. History  Smoking Status  . Former Smoker  . Types: Cigarettes  . Quit date: 01/24/1993  Smokeless Tobacco  . Never Used   ROS: Per HPI   Objective:    BP 139/80   Pulse 89   Temp 98 F (36.7 C) (Oral)   Ht 5' (1.524 m)   Wt 175 lb 6.4 oz (79.6 kg)   BMI 34.26 kg/m   Wt Readings from Last 3 Encounters:  03/04/16 175 lb 6.4 oz (79.6 kg)  12/28/15 174 lb (78.9 kg)  11/17/15 169 lb 8 oz (76.9 kg)    Gen: NAD, alert, cooperative with exam, NCAT EYES: EOMI, no conjunctival injection, or no icterus CV: NRRR, normal S1/S2 Resp: CTABL, no wheezes, normal WOB Ext: No edema, warm Neuro: Alert and oriented, strength equal b/l UE and LE, coordination grossly  normal MSK: no redness or swelling, skin intact L foot. L foot slightly tender over top of foot. No TTP along metacarpals. No pain with moving toes or with ankle ROM with or without resistance. No pain plantar side of foot  Assessment & Plan:  [name] was seen today for foot pain.  Diagnoses and all orders for this visit:  Left foot pain Xray without acute fracture, no redness, minimal tenderness Rest, wear shoes that do not compress top of foot, use aspercream BID -     DG Foot Complete Left; Future  Depression, unspecified depression type Ongoing symptoms, on cymbalta Not interested in counseling referral now Feels safe at home, wants depressive symptoms improved Started on cymbalta apprx 8 weeks ago, now on 60mg  Cont cymbalta Pt still taking lexapro as well, stop lexapro given risk being on multiple serotonin agonists May benefit from virtual [facility] once started next couple of weeks  Follow up plan: 3 weeks as scheduled Assunta Found, MD [place]

## 2016-03-07 ENCOUNTER — Other Ambulatory Visit: Payer: Self-pay | Admitting: Family Medicine

## 2016-03-24 DIAGNOSIS — M79672 Pain in left foot: Secondary | ICD-10-CM | POA: Diagnosis not present

## 2016-03-24 DIAGNOSIS — M76812 Anterior tibial syndrome, left leg: Secondary | ICD-10-CM | POA: Diagnosis not present

## 2016-03-28 ENCOUNTER — Other Ambulatory Visit: Payer: Self-pay | Admitting: Pediatrics

## 2016-03-28 NOTE — Telephone Encounter (Signed)
LM [id number]-jhb

## 2016-03-28 NOTE — Telephone Encounter (Signed)
Has appt with PCP on Friday. OK to wait until then? Like to fill these medications in a clinic visit.

## 2016-03-28 NOTE — Telephone Encounter (Signed)
Last seen Shirley Brown for acute OV 02/2016 [facility] pt = last seen him 12/2015  Please address refill and have nurse phone in if approved.

## 2016-04-01 ENCOUNTER — Ambulatory Visit (INDEPENDENT_AMBULATORY_CARE_PROVIDER_SITE_OTHER): Payer: Medicare Other | Admitting: Family Medicine

## 2016-04-01 ENCOUNTER — Encounter: Payer: Self-pay | Admitting: Family Medicine

## 2016-04-01 VITALS — BP 116/54 | HR 70 | Temp 97.6°F | Ht 60.0 in | Wt 171.0 lb

## 2016-04-01 DIAGNOSIS — I10 Essential (primary) hypertension: Secondary | ICD-10-CM | POA: Diagnosis not present

## 2016-04-01 DIAGNOSIS — E782 Mixed hyperlipidemia: Secondary | ICD-10-CM

## 2016-04-01 DIAGNOSIS — K529 Noninfective gastroenteritis and colitis, unspecified: Secondary | ICD-10-CM

## 2016-04-01 DIAGNOSIS — G4700 Insomnia, unspecified: Secondary | ICD-10-CM | POA: Diagnosis not present

## 2016-04-01 MED ORDER — ZOLPIDEM TARTRATE 10 MG PO TABS: 10.0000 mg | ORAL_TABLET | Freq: Every evening | ORAL | 5 refills | Status: DC | PRN

## 2016-04-01 MED ORDER — ONDANSETRON 4 MG PO TBDP: 4.0000 mg | ORAL_TABLET | Freq: Three times a day (TID) | ORAL | 0 refills | Status: DC | PRN

## 2016-04-01 NOTE — Progress Notes (Signed)
Subjective:  Patient ID: Shirley Brown, female    DOB: Dec 18, 1940  Age: 76 y.o. MRN: [id number]  CC: Hypertension (pt here today for routine follow up on her HTN)   HPI [name] presents for  follow-up of hypertension. Patient has no history of headache chest pain or shortness of breath or recent cough. Patient also denies symptoms of TIA such as numbness weakness lateralizing. Patient checks  blood pressure at home and has not had any elevated readings recently. Patient denies side effects from medication. States taking it regularly.  Patient reports she's been having some vomiting and diarrhea over the last 5 days. Last vomiting was yesterday. The diarrhea continues to 3 times daily.   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   She has a past surgical history that includes Cholecystectomy; Joint replacement (Right, 1995); Joint replacement (Left, 2001); Appendectomy; and Throat surgery (Left).   Her family history includes Cancer in her brother; Diabetes in her son; Heart attack in her father; Heart disease in her father and sister; Hyperlipidemia in her father, sister, and son; Hypertension in her father and sister; Other in her sister.She reports that she quit smoking about 23 years ago. Her smoking use included Cigarettes. She has never used smokeless tobacco. She reports that she does not drink alcohol or use drugs.  Current Outpatient Prescriptions on File Prior to Visit  Medication Sig Dispense Refill  . acyclovir (ZOVIRAX) 400 MG tablet Take 1 Tablet by mouth once daily 30 tablet 2  . amLODipine (NORVASC) 5 MG tablet Take 1 Tablet by mouth once daily 90 tablet 0  . aspirin 81 MG tablet Take 81 mg by mouth daily.    . Cholecalciferol (VITAMIN D3) 3000 UNITS TABS Take by mouth.    . clotrimazole-betamethasone (LOTRISONE) cream  Apply 1 application topically 2 (two) times daily. 30 g 6  . Cyanocobalamin (VITAMIN B 12 PO) Take by mouth.    . DULoxetine (CYMBALTA) 60 MG capsule Take 1 capsule (60 mg total) by mouth daily. 30 capsule 5  . fenofibrate 160 MG tablet Take 1 Tablet by mouth once daily FOR cholesterol AND triglyceride 90 tablet 0  . fish oil-omega-3 fatty acids 1000 MG capsule Take 1,200 mg by mouth daily.     . fluocinonide-emollient (LIDEX-E) 0.05 % cream Apply 1 application topically 2 (two) times daily. To affected areas 60 g 5  . folic acid (FOLVITE) 1 MG tablet Take 1 Tablet by mouth once daily 90 tablet 0  . levocetirizine (XYZAL) 5 MG tablet Take 1 tablet (5 mg total) by mouth every evening. 30 tablet 11  . pantoprazole (PROTONIX) 40 MG tablet Take 1 tablet (40 mg total) by mouth 2 (two) times daily. 180 tablet 0  . vitamin E 200 UNIT capsule Take 400 Units by mouth daily.      No current facility-administered medications on file prior to visit.     ROS Review of Systems  Constitutional: Negative for activity change, appetite change and fever.  HENT: Negative for congestion, rhinorrhea and sore throat.   Eyes: Negative for visual disturbance.  Respiratory: Negative for cough and shortness of breath.   Cardiovascular: Negative for chest pain and palpitations.  Gastrointestinal: Positive for diarrhea, nausea and vomiting. Negative for abdominal pain.  Genitourinary: Negative for dysuria.  Musculoskeletal: Negative for arthralgias and myalgias.    Objective:  BP (!) 116/54  Pulse 70   Temp 97.6 F (36.4 C) (Oral)   Ht 5' (1.524 m)   Wt 171 lb (77.6 kg)   BMI 33.40 kg/m   BP Readings from Last 3 Encounters:  04/01/16 (!) 116/54  03/04/16 139/80  12/28/15 130/73    Wt Readings from Last 3 Encounters:  04/01/16 171 lb (77.6 kg)  03/04/16 175 lb 6.4 oz (79.6 kg)  12/28/15 174 lb (78.9 kg)     Physical Exam  Constitutional: She is oriented to person, place, and time. She appears  well-developed and well-nourished. No distress.  HENT:  Head: Normocephalic and atraumatic.  Right Ear: External ear normal.  Left Ear: External ear normal.  Nose: Nose normal.  Mouth/Throat: Oropharynx is clear and moist.  Eyes: Conjunctivae and EOM are normal. Pupils are equal, round, and reactive to light.  Neck: Normal range of motion. Neck supple. No thyromegaly present.  Cardiovascular: Normal rate, regular rhythm and normal heart sounds.   No murmur heard. Pulmonary/Chest: Effort normal and breath sounds normal. No respiratory distress. She has no wheezes. She has no rales.  Abdominal: Soft. Bowel sounds are normal. She exhibits distension. She exhibits no mass. There is tenderness (mild and diffuse). There is no rebound and no guarding.  Lymphadenopathy:    She has no cervical adenopathy.  Neurological: She is alert and oriented to person, place, and time. She has normal reflexes.  Skin: Skin is warm and dry.  Psychiatric: She has a normal mood and affect. Her behavior is normal. Judgment and thought content normal.     Lab Results  Component Value Date   WBC 3.7 04/01/2016   HGB 10.1 (A) 06/06/2014   HCT 31.2 (L) 04/01/2016   PLT 254 04/01/2016   GLUCOSE 86 04/01/2016   CHOL 105 04/01/2016   TRIG 92 04/01/2016   HDL 39 (L) 04/01/2016   LDLCALC 48 04/01/2016   ALT 15 04/01/2016   AST 29 04/01/2016   NA 141 04/01/2016   K 4.3 04/01/2016   CL 101 04/01/2016   CREATININE 0.85 04/01/2016   BUN 27 04/01/2016   CO2 23 04/01/2016   TSH 2.590 02/03/2014   HGBA1C 5.4 % 08/03/2012    No results found.  Assessment & Plan:   Havah was seen today for hypertension.  Diagnoses and all orders for this visit:  Insomnia, unspecified type -     CBC with Differential/Platelet -     CMP14+EGFR  Essential hypertension -     CBC with Differential/Platelet -     CMP14+EGFR  Mixed hyperlipidemia -     CBC with Differential/Platelet -     CMP14+EGFR -     Lipid  panel  Gastroenteritis -     CBC with Differential/Platelet -     CMP14+EGFR  Other orders -     ondansetron (ZOFRAN-ODT) 4 MG disintegrating tablet; Take 1 tablet (4 mg total) by mouth every 8 (eight) hours as needed (stomach). -     Discontinue: zolpidem (AMBIEN) 10 MG tablet; Take 1 tablet (10 mg total) by mouth at bedtime as needed for sleep. -     Discontinue: zolpidem (AMBIEN) 10 MG tablet; Take 1 tablet (10 mg total) by mouth at bedtime as needed for sleep. -     zolpidem (AMBIEN) 10 MG tablet; Take 1 tablet (10 mg total) by mouth at bedtime as needed for sleep.   I am having Ms. Hay start on ondansetron. I am also having her maintain her fish oil-omega-3 fatty  acids, Cyanocobalamin (VITAMIN B 12 PO), Vitamin D3, aspirin, vitamin E, clotrimazole-betamethasone, levocetirizine, amLODipine, pantoprazole, folic acid, DULoxetine, fluocinonide-emollient, acyclovir, fenofibrate, and zolpidem.  Meds ordered this encounter  Medications  . ondansetron (ZOFRAN-ODT) 4 MG disintegrating tablet    Sig: Take 1 tablet (4 mg total) by mouth every 8 (eight) hours as needed (stomach).    Dispense:  20 tablet    Refill:  0  . DISCONTD: zolpidem (AMBIEN) 10 MG tablet    Sig: Take 1 tablet (10 mg total) by mouth at bedtime as needed for sleep.    Dispense:  30 tablet    Refill:  5    Not to exceed 5 additional fills before 11/03/2015.  Marland Kitchen DISCONTD: zolpidem (AMBIEN) 10 MG tablet    Sig: Take 1 tablet (10 mg total) by mouth at bedtime as needed for sleep.    Dispense:  30 tablet    Refill:  5    Not to exceed 5 additional fills before 11/03/2015.  . zolpidem (AMBIEN) 10 MG tablet    Sig: Take 1 tablet (10 mg total) by mouth at bedtime as needed for sleep.    Dispense:  30 tablet    Refill:  5    Not to exceed 5 additional fills before 11/03/2015.      Follow-up: Return in about 6 months (around 10/02/2016).  Claretta Fraise, M.D.

## 2016-04-02 LAB — CMP14+EGFR
ALT: 15 IU/L (ref 0–32)
AST: 29 IU/L (ref 0–40)
Albumin/Globulin Ratio: 2 (ref 1.2–2.2)
Albumin: 4.1 g/dL (ref 3.5–4.8)
Alkaline Phosphatase: 49 IU/L (ref 39–117)
BUN/Creatinine Ratio: 32 — ABNORMAL HIGH (ref 12–28)
BUN: 27 mg/dL (ref 8–27)
Bilirubin Total: 0.3 mg/dL (ref 0.0–1.2)
CALCIUM: 9.3 mg/dL (ref 8.7–10.3)
CO2: 23 mmol/L (ref 18–29)
CREATININE: 0.85 mg/dL (ref 0.57–1.00)
Chloride: 101 mmol/L (ref 96–106)
GFR calc Af Amer: 78 mL/min/{1.73_m2} (ref >59)
GFR, EST NON AFRICAN AMERICAN: 68 mL/min/{1.73_m2} (ref >59)
Globulin, Total: 2.1 g/dL (ref 1.5–4.5)
Glucose: 86 mg/dL (ref 65–99)
Potassium: 4.3 mmol/L (ref 3.5–5.2)
Sodium: 141 mmol/L (ref 134–144)
Total Protein: 6.2 g/dL (ref 6.0–8.5)

## 2016-04-02 LAB — CBC WITH DIFFERENTIAL/PLATELET
Basophils Absolute: 0 10*3/uL (ref 0.0–0.2)
Basos: 1 %
EOS (ABSOLUTE): 0.2 10*3/uL (ref 0.0–0.4)
EOS: 5 %
HEMATOCRIT: 31.2 % — AB (ref 34.0–46.6)
Hemoglobin: 10.5 g/dL — ABNORMAL LOW (ref 11.1–15.9)
IMMATURE GRANULOCYTES: 0 %
Immature Grans (Abs): 0 10*3/uL (ref 0.0–0.1)
LYMPHS: 32 %
Lymphocytes Absolute: 1.2 10*3/uL (ref 0.7–3.1)
MCH: 29.7 pg (ref 26.6–33.0)
MCHC: 33.7 g/dL (ref 31.5–35.7)
MCV: 88 fL (ref 79–97)
MONOS ABS: 0.4 10*3/uL (ref 0.1–0.9)
Monocytes: 12 %
NEUTROS PCT: 50 %
Neutrophils Absolute: 1.9 10*3/uL (ref 1.4–7.0)
PLATELETS: 254 10*3/uL (ref 150–379)
RBC: 3.53 x10E6/uL — AB (ref 3.77–5.28)
RDW: 14.2 % (ref 12.3–15.4)
WBC: 3.7 10*3/uL (ref 3.4–10.8)

## 2016-04-02 LAB — LIPID PANEL
CHOL/HDL RATIO: 2.7 ratio (ref 0.0–4.4)
Cholesterol, Total: 105 mg/dL (ref 100–199)
HDL: 39 mg/dL — ABNORMAL LOW (ref >39)
LDL CALC: 48 mg/dL (ref 0–99)
TRIGLYCERIDES: 92 mg/dL (ref 0–149)
VLDL Cholesterol Cal: 18 mg/dL (ref 5–40)

## 2016-04-09 ENCOUNTER — Other Ambulatory Visit: Payer: Self-pay | Admitting: Family

## 2016-04-13 ENCOUNTER — Other Ambulatory Visit: Payer: Self-pay | Admitting: Family Medicine

## 2016-04-19 DIAGNOSIS — Z1231 Encounter for screening mammogram for malignant neoplasm of breast: Secondary | ICD-10-CM | POA: Diagnosis not present

## 2016-04-20 ENCOUNTER — Other Ambulatory Visit: Payer: Self-pay

## 2016-04-21 DIAGNOSIS — M76812 Anterior tibial syndrome, left leg: Secondary | ICD-10-CM | POA: Diagnosis not present

## 2016-04-21 DIAGNOSIS — M79672 Pain in left foot: Secondary | ICD-10-CM | POA: Diagnosis not present

## 2016-04-28 ENCOUNTER — Other Ambulatory Visit: Payer: Self-pay | Admitting: Family

## 2016-05-02 ENCOUNTER — Other Ambulatory Visit: Payer: Self-pay | Admitting: Family Medicine

## 2016-05-20 ENCOUNTER — Other Ambulatory Visit: Payer: Self-pay | Admitting: Pediatrics

## 2016-05-23 NOTE — Telephone Encounter (Signed)
Refill called to [place] [facility] 

## 2016-06-04 ENCOUNTER — Other Ambulatory Visit: Payer: Self-pay | Admitting: Family Medicine

## 2016-06-21 ENCOUNTER — Other Ambulatory Visit: Payer: Self-pay | Admitting: Family

## 2016-06-22 ENCOUNTER — Other Ambulatory Visit: Payer: Self-pay | Admitting: Family

## 2016-06-22 NOTE — Telephone Encounter (Signed)
Seen last stacks for insonmia 04/01/16 - please address refill

## 2016-06-24 NOTE — Telephone Encounter (Signed)
Phoned in.

## 2016-07-05 ENCOUNTER — Other Ambulatory Visit: Payer: Self-pay | Admitting: Family

## 2016-08-02 ENCOUNTER — Other Ambulatory Visit: Payer: Self-pay | Admitting: Family Medicine

## 2016-08-02 ENCOUNTER — Other Ambulatory Visit: Payer: Self-pay | Admitting: Family

## 2016-09-03 ENCOUNTER — Other Ambulatory Visit: Payer: Self-pay | Admitting: Family

## 2016-09-03 ENCOUNTER — Other Ambulatory Visit: Payer: Self-pay | Admitting: Family Medicine

## 2016-09-05 ENCOUNTER — Other Ambulatory Visit: Payer: Self-pay | Admitting: *Deleted

## 2016-09-05 MED ORDER — FENOFIBRATE 160 MG PO TABS: ORAL_TABLET | ORAL | 0 refills | Status: DC

## 2016-09-05 MED ORDER — AMLODIPINE BESYLATE 5 MG PO TABS: 5.0000 mg | ORAL_TABLET | Freq: Every day | ORAL | 0 refills | Status: DC

## 2016-10-03 ENCOUNTER — Other Ambulatory Visit: Payer: Self-pay | Admitting: Family Medicine

## 2016-10-03 NOTE — Telephone Encounter (Signed)
Next OV 10/04/16

## 2016-10-04 ENCOUNTER — Ambulatory Visit (INDEPENDENT_AMBULATORY_CARE_PROVIDER_SITE_OTHER): Payer: Medicare Other | Admitting: Family Medicine

## 2016-10-04 ENCOUNTER — Encounter: Payer: Self-pay | Admitting: Family Medicine

## 2016-10-04 ENCOUNTER — Other Ambulatory Visit: Payer: Self-pay | Admitting: Family

## 2016-10-04 VITALS — BP 114/63 | HR 80 | Ht 60.0 in | Wt 175.0 lb

## 2016-10-04 DIAGNOSIS — I10 Essential (primary) hypertension: Secondary | ICD-10-CM

## 2016-10-04 DIAGNOSIS — K130 Diseases of lips: Secondary | ICD-10-CM | POA: Diagnosis not present

## 2016-10-04 DIAGNOSIS — E559 Vitamin D deficiency, unspecified: Secondary | ICD-10-CM | POA: Diagnosis not present

## 2016-10-04 DIAGNOSIS — E782 Mixed hyperlipidemia: Secondary | ICD-10-CM | POA: Diagnosis not present

## 2016-10-04 MED ORDER — PANTOPRAZOLE SODIUM 40 MG PO TBEC: 40.0000 mg | DELAYED_RELEASE_TABLET | Freq: Two times a day (BID) | ORAL | 1 refills | Status: DC

## 2016-10-04 MED ORDER — AMLODIPINE BESYLATE 5 MG PO TABS: 5.0000 mg | ORAL_TABLET | Freq: Every day | ORAL | 1 refills | Status: DC

## 2016-10-04 MED ORDER — DULOXETINE HCL 60 MG PO CPEP: 60.0000 mg | ORAL_CAPSULE | Freq: Every day | ORAL | 1 refills | Status: DC

## 2016-10-04 MED ORDER — PRAVASTATIN SODIUM 10 MG PO TABS: 10.0000 mg | ORAL_TABLET | Freq: Every day | ORAL | 1 refills | Status: DC

## 2016-10-04 MED ORDER — FENOFIBRATE 160 MG PO TABS: ORAL_TABLET | ORAL | 1 refills | Status: DC

## 2016-10-04 MED ORDER — ZOLPIDEM TARTRATE 10 MG PO TABS: 10.0000 mg | ORAL_TABLET | Freq: Every evening | ORAL | 5 refills | Status: DC | PRN

## 2016-10-04 MED ORDER — CLOTRIMAZOLE-BETAMETHASONE 1-0.05 % EX CREA: 1.0000 "application " | TOPICAL_CREAM | Freq: Two times a day (BID) | CUTANEOUS | 6 refills | Status: DC

## 2016-10-04 MED ORDER — ACYCLOVIR 400 MG PO TABS: 400.0000 mg | ORAL_TABLET | Freq: Every day | ORAL | 2 refills | Status: DC

## 2016-10-04 NOTE — Progress Notes (Signed)
Subjective:  Patient ID: Shirley Brown, female    DOB: [birth year]  Age: 76 y.o. MRN: [id number]  CC: Hypertension (pt here today for routine follow up of her chronic medical conditions)   HPI Shirley Brown presents for  follow-up of hypertension. Patient has no history of headache chest pain or shortness of breath or recent cough. Patient also denies symptoms of TIA such as focal numbness or weakness. Patient does not check  blood pressure at home. Patient denies side effects from medication. States taking it regularly.  Patient in for follow-up of elevated cholesterol. Doing well without complaints on current medication. Denies side effects of statin including myalgia and arthralgia and nausea. Also in today for liver function testing. Currently no chest pain, shortness of breath or other cardiovascular related symptoms noted. Patient states that she has been chronically depressed. Medication agreement with her now. As noted in pH Q below symptoms have remitted as long as she takes her medication.  She is taking an unknown dose of over-the-counter vitamin D twice a day at home. She is due for recheck of blood level.   Patient in for follow-up of GERD. Currently asymptomatic taking  PPI daily. There is no chest pain or heartburn. No hematemesis and no melena. No dysphagia or choking. Onset is remote. Progression is stable. Complicating factors, none.  Depression screen Shirley Brown 2/9 10/04/2016 04/01/2016 03/04/2016  Decreased Interest 0 0 0  Down, Depressed, Hopeless 0 0 0  PHQ - 2 Score 0 0 0  Altered sleeping - - -  Tired, decreased energy - - -  Change in appetite - - -  Feeling bad or failure about yourself  - - -  Trouble concentrating - - -  Moving slowly or fidgety/restless - - -  Suicidal thoughts - - -  PHQ-9 Score - - -  Difficult doing work/chores - - -    History [name] has a past medical history of Allergy to meropenem; Anemia; Arthritis; Cancer ([place]); Carotid artery occlusion  (05/11/09); Depression (2011); Hyperlipidemia; and Hypertension.   She has a past surgical history that includes Cholecystectomy; Joint replacement (Right, 1995); Joint replacement (Left, 2001); Appendectomy; and Throat surgery (Left).   Her family history includes Cancer in her brother; Diabetes in her son; Heart attack in her father; Heart disease in her father and sister; Hyperlipidemia in her father, sister, and son; Hypertension in her father and sister; Other in her sister.She reports that she quit smoking about 23 years ago. Her smoking use included Cigarettes. She has never used smokeless tobacco. She reports that she does not drink alcohol or use drugs.  Current Outpatient Prescriptions on File Prior to Visit  Medication Sig Dispense Refill  . aspirin 81 MG tablet Take 81 mg by mouth daily.    . Cholecalciferol (VITAMIN D3) 3000 UNITS TABS Take by mouth.    . Cyanocobalamin (VITAMIN B 12 PO) Take by mouth.    . fish oil-omega-3 fatty acids 1000 MG capsule Take 1,200 mg by mouth daily.     . fluocinonide-emollient (LIDEX-E) 0.05 % cream Apply 1 application topically 2 (two) times daily. To affected areas 60 g 5  . folic acid (FOLVITE) 1 MG tablet Take 1 Tablet by mouth once daily 90 tablet 0  . levocetirizine (XYZAL) 5 MG tablet Take 1 tablet (5 mg total) by mouth every evening. 30 tablet 11  . ondansetron (ZOFRAN-ODT) 4 MG disintegrating tablet Take 1 tablet (4 mg total) by mouth every 8 (eight) hours  as needed (stomach). 20 tablet 0  . vitamin E 200 UNIT capsule Take 400 Units by mouth daily.      No current facility-administered medications on file prior to visit.     ROS Review of Systems  Constitutional: Negative for activity change, appetite change and fever.  HENT: Negative for congestion, rhinorrhea and sore throat.   Eyes: Negative for visual disturbance.  Respiratory: Negative for cough and shortness of breath.   Cardiovascular: Negative for chest pain and palpitations.    Gastrointestinal: Negative for abdominal pain, diarrhea and nausea.  Genitourinary: Negative for dysuria.  Musculoskeletal: Negative for arthralgias and myalgias.    Objective:  BP 114/63   Pulse 80   Ht 5' (1.524 m)   Wt 175 lb (79.4 kg)   BMI 34.18 kg/m   BP Readings from Last 3 Encounters:  10/04/16 114/63  04/01/16 (!) 116/54  03/04/16 139/80    Wt Readings from Last 3 Encounters:  10/04/16 175 lb (79.4 kg)  04/01/16 171 lb (77.6 kg)  03/04/16 175 lb 6.4 oz (79.6 kg)     Physical Exam  Constitutional: She is oriented to person, place, and time. She appears well-developed and well-nourished. No distress.  HENT:  Head: Normocephalic and atraumatic.  Right Ear: External ear normal.  Left Ear: External ear normal.  Nose: Nose normal.  Mouth/Throat: Oropharynx is clear and moist.  Eyes: Pupils are equal, round, and reactive to light. Conjunctivae and EOM are normal.  Neck: Normal range of motion. Neck supple. No thyromegaly present.  Cardiovascular: Normal rate, regular rhythm and normal heart sounds.   No murmur heard. Pulmonary/Chest: Effort normal and breath sounds normal. No respiratory distress. She has no wheezes. She has no rales.  Abdominal: Soft. Bowel sounds are normal. She exhibits no distension. There is no tenderness.  Lymphadenopathy:    She has no cervical adenopathy.  Neurological: She is alert and oriented to person, place, and time. She has normal reflexes.  Skin: Skin is warm and dry.  Psychiatric: She has a normal mood and affect. Her behavior is normal. Judgment and thought content normal.      Assessment & Plan:   Shirley Brown was seen today for hypertension.  Diagnoses and all orders for this visit:  Mixed hyperlipidemia -     Lipid panel  Cheilitis -     clotrimazole-betamethasone (LOTRISONE) cream; Apply 1 application topically 2 (two) times daily.  Vitamin D deficiency -     VITAMIN D 25 Hydroxy (Vit-D Deficiency,  Fractures)  Essential hypertension -     CBC with Differential/Platelet -     CMP14+EGFR -     Vitamin B12  Other orders -     amLODipine (NORVASC) 5 MG tablet; Take 1 tablet (5 mg total) by mouth daily. -     acyclovir (ZOVIRAX) 400 MG tablet; Take 1 tablet (400 mg total) by mouth daily. -     DULoxetine (CYMBALTA) 60 MG capsule; Take 1 capsule (60 mg total) by mouth daily. -     fenofibrate 160 MG tablet; Take 1 Tablet by mouth once daily FOR cholesterol AND triglyceride -     pantoprazole (PROTONIX) 40 MG tablet; Take 1 tablet (40 mg total) by mouth 2 (two) times daily. -     pravastatin (PRAVACHOL) 10 MG tablet; Take 1 tablet (10 mg total) by mouth at bedtime. -     zolpidem (AMBIEN) 10 MG tablet; Take 1 tablet (10 mg total) by mouth at bedtime as needed.  Allergies as of 10/04/2016      Reactions   Bupropion Other (See Comments)   Lips swelled   Sulfa Antibiotics Itching, Rash   Benadryl [diphenhydramine Hcl (sleep)] Other (See Comments)   Insomnia   Doxycycline Nausea And Vomiting   Dizziness   Desyrel [trazodone] Other (See Comments)   Keeps me up all night      Medication List       Accurate as of 10/04/16  6:43 PM. Always use your most recent med list.          acyclovir 400 MG tablet Commonly known as:  ZOVIRAX Take 1 tablet (400 mg total) by mouth daily.   amLODipine 5 MG tablet Commonly known as:  NORVASC Take 1 tablet (5 mg total) by mouth daily.   aspirin 81 MG tablet Take 81 mg by mouth daily.   clotrimazole-betamethasone cream Commonly known as:  LOTRISONE Apply 1 application topically 2 (two) times daily.   DULoxetine 60 MG capsule Commonly known as:  CYMBALTA Take 1 capsule (60 mg total) by mouth daily.   fenofibrate 160 MG tablet Take 1 Tablet by mouth once daily FOR cholesterol AND triglyceride   fish oil-omega-3 fatty acids 1000 MG capsule Take 1,200 mg by mouth daily.   fluocinonide-emollient 0.05 % cream Commonly known as:   LIDEX-E Apply 1 application topically 2 (two) times daily. To affected areas   folic acid 1 MG tablet Commonly known as:  FOLVITE Take 1 Tablet by mouth once daily   levocetirizine 5 MG tablet Commonly known as:  XYZAL Take 1 tablet (5 mg total) by mouth every evening.   ondansetron 4 MG disintegrating tablet Commonly known as:  ZOFRAN-ODT Take 1 tablet (4 mg total) by mouth every 8 (eight) hours as needed (stomach).   pantoprazole 40 MG tablet Commonly known as:  PROTONIX Take 1 tablet (40 mg total) by mouth 2 (two) times daily.   pravastatin 10 MG tablet Commonly known as:  PRAVACHOL Take 1 tablet (10 mg total) by mouth at bedtime.   VITAMIN B 12 PO Take by mouth.   Vitamin D3 3000 units Tabs Take by mouth.   vitamin E 200 UNIT capsule Take 400 Units by mouth daily.   zolpidem 10 MG tablet Commonly known as:  AMBIEN Take 1 tablet (10 mg total) by mouth at bedtime as needed.            Discharge Care Instructions        Start     Ordered   10/04/16 0000  clotrimazole-betamethasone (LOTRISONE) cream  2 times daily     10/04/16 1126   10/04/16 0000  amLODipine (NORVASC) 5 MG tablet  Daily     10/04/16 1126   10/04/16 0000  acyclovir (ZOVIRAX) 400 MG tablet  Daily     10/04/16 1126   10/04/16 0000  DULoxetine (CYMBALTA) 60 MG capsule  Daily     10/04/16 1126   10/04/16 0000  fenofibrate 160 MG tablet     10/04/16 1126   10/04/16 0000  pantoprazole (PROTONIX) 40 MG tablet  2 times daily    Comments:  This prescription was filled on 08/02/2016. Any refills authorized will be placed on file.   10/04/16 1126   10/04/16 0000  pravastatin (PRAVACHOL) 10 MG tablet  Daily at bedtime    Comments:  This prescription was filled on 04/09/2016. Any refills authorized will be placed on file.   10/04/16 1126   10/04/16 0000  zolpidem (AMBIEN)  10 MG tablet  At bedtime PRN    Comments:  This prescription was filled on 06/22/2016. Any refills authorized will be placed on file.    10/04/16 1126   10/04/16 0000  Lipid panel     10/04/16 1130   10/04/16 0000  CBC with Differential/Platelet     10/04/16 1130   10/04/16 0000  CMP14+EGFR     10/04/16 1130   10/04/16 0000  Vitamin B12     10/04/16 1130   10/04/16 0000  VITAMIN D 25 Hydroxy (Vit-D Deficiency, Fractures)     10/04/16 1130      Meds ordered this encounter  Medications  . clotrimazole-betamethasone (LOTRISONE) cream    Sig: Apply 1 application topically 2 (two) times daily.    Dispense:  30 g    Refill:  6  . amLODipine (NORVASC) 5 MG tablet    Sig: Take 1 tablet (5 mg total) by mouth daily.    Dispense:  90 tablet    Refill:  1  . acyclovir (ZOVIRAX) 400 MG tablet    Sig: Take 1 tablet (400 mg total) by mouth daily.    Dispense:  30 tablet    Refill:  2  . DULoxetine (CYMBALTA) 60 MG capsule    Sig: Take 1 capsule (60 mg total) by mouth daily.    Dispense:  90 capsule    Refill:  1  . fenofibrate 160 MG tablet    Sig: Take 1 Tablet by mouth once daily FOR cholesterol AND triglyceride    Dispense:  90 tablet    Refill:  1  . pantoprazole (PROTONIX) 40 MG tablet    Sig: Take 1 tablet (40 mg total) by mouth 2 (two) times daily.    Dispense:  180 tablet    Refill:  1    This prescription was filled on 08/02/2016. Any refills authorized will be placed on file.  . pravastatin (PRAVACHOL) 10 MG tablet    Sig: Take 1 tablet (10 mg total) by mouth at bedtime.    Dispense:  90 tablet    Refill:  1    This prescription was filled on 04/09/2016. Any refills authorized will be placed on file.  . zolpidem (AMBIEN) 10 MG tablet    Sig: Take 1 tablet (10 mg total) by mouth at bedtime as needed.    Dispense:  30 tablet    Refill:  5    This prescription was filled on 06/22/2016. Any refills authorized will be placed on file.      Follow-up: Return in about 6 months (around 04/03/2017) for hypertension, Depression, cholesterol.  Claretta Fraise, M.D.

## 2016-10-05 LAB — CBC WITH DIFFERENTIAL/PLATELET
BASOS ABS: 0.1 10*3/uL (ref 0.0–0.2)
Basos: 1 %
EOS (ABSOLUTE): 0.4 10*3/uL (ref 0.0–0.4)
Eos: 8 %
Hematocrit: 30.1 % — ABNORMAL LOW (ref 34.0–46.6)
Hemoglobin: 9.9 g/dL — ABNORMAL LOW (ref 11.1–15.9)
Immature Grans (Abs): 0 10*3/uL (ref 0.0–0.1)
Immature Granulocytes: 0 %
LYMPHS ABS: 1.6 10*3/uL (ref 0.7–3.1)
Lymphs: 34 %
MCH: 29.6 pg (ref 26.6–33.0)
MCHC: 32.9 g/dL (ref 31.5–35.7)
MCV: 90 fL (ref 79–97)
MONOS ABS: 0.7 10*3/uL (ref 0.1–0.9)
Monocytes: 14 %
NEUTROS ABS: 2 10*3/uL (ref 1.4–7.0)
Neutrophils: 43 %
PLATELETS: 303 10*3/uL (ref 150–379)
RBC: 3.34 x10E6/uL — ABNORMAL LOW (ref 3.77–5.28)
RDW: 14.6 % (ref 12.3–15.4)
WBC: 4.7 10*3/uL (ref 3.4–10.8)

## 2016-10-05 LAB — CMP14+EGFR
A/G RATIO: 2 (ref 1.2–2.2)
ALK PHOS: 51 IU/L (ref 39–117)
ALT: 9 IU/L (ref 0–32)
AST: 23 IU/L (ref 0–40)
Albumin: 4.5 g/dL (ref 3.5–4.8)
BILIRUBIN TOTAL: 0.4 mg/dL (ref 0.0–1.2)
BUN / CREAT RATIO: 21 (ref 12–28)
BUN: 23 mg/dL (ref 8–27)
CHLORIDE: 99 mmol/L (ref 96–106)
CO2: 23 mmol/L (ref 20–29)
Calcium: 10.2 mg/dL (ref 8.7–10.3)
Creatinine, Ser: 1.08 mg/dL — ABNORMAL HIGH (ref 0.57–1.00)
GFR calc Af Amer: 58 mL/min/{1.73_m2} — ABNORMAL LOW (ref >59)
GFR calc non Af Amer: 50 mL/min/{1.73_m2} — ABNORMAL LOW (ref >59)
GLUCOSE: 94 mg/dL (ref 65–99)
Globulin, Total: 2.3 g/dL (ref 1.5–4.5)
POTASSIUM: 4.6 mmol/L (ref 3.5–5.2)
Sodium: 139 mmol/L (ref 134–144)
Total Protein: 6.8 g/dL (ref 6.0–8.5)

## 2016-10-05 LAB — LIPID PANEL
Chol/HDL Ratio: 2.9 ratio (ref 0.0–4.4)
Cholesterol, Total: 136 mg/dL (ref 100–199)
HDL: 47 mg/dL (ref >39)
LDL Calculated: 69 mg/dL (ref 0–99)
TRIGLYCERIDES: 98 mg/dL (ref 0–149)
VLDL Cholesterol Cal: 20 mg/dL (ref 5–40)

## 2016-10-05 LAB — VITAMIN B12: VITAMIN B 12: 528 pg/mL (ref 232–1245)

## 2016-10-05 LAB — VITAMIN D 25 HYDROXY (VIT D DEFICIENCY, FRACTURES): Vit D, 25-Hydroxy: 51.8 ng/mL (ref 30.0–100.0)

## 2016-10-19 ENCOUNTER — Ambulatory Visit (INDEPENDENT_AMBULATORY_CARE_PROVIDER_SITE_OTHER): Payer: Medicare Other

## 2016-10-19 DIAGNOSIS — Z23 Encounter for immunization: Secondary | ICD-10-CM

## 2016-11-13 ENCOUNTER — Other Ambulatory Visit: Payer: Self-pay | Admitting: Family Medicine

## 2016-11-25 ENCOUNTER — Ambulatory Visit (INDEPENDENT_AMBULATORY_CARE_PROVIDER_SITE_OTHER): Payer: Medicare Other | Admitting: Family

## 2016-11-25 ENCOUNTER — Encounter: Payer: Self-pay | Admitting: Family

## 2016-11-25 VITALS — BP 124/63 | HR 77 | Temp 97.4°F | Ht 60.0 in | Wt 174.6 lb

## 2016-11-25 DIAGNOSIS — H6121 Impacted cerumen, right ear: Secondary | ICD-10-CM | POA: Diagnosis not present

## 2016-11-25 NOTE — Progress Notes (Signed)
   Subjective:    Patient ID: Shirley Brown, female    DOB: 05-Feb-1940, 76 y.o.   MRN: [id number]  Ear Fullness   There is pain in the right ear. This is a new problem. The current episode started 1 to 4 weeks ago. The problem occurs constantly. The problem has been unchanged. There has been no fever. The patient is experiencing no pain. Associated symptoms include hearing loss. Pertinent negatives include no coughing, diarrhea, ear discharge, headaches, rhinorrhea, sore throat or vomiting. She has tried ear drops for the symptoms. The treatment provided no relief.      Review of Systems  HENT: Positive for hearing loss. Negative for ear discharge, rhinorrhea and sore throat.   Eyes: Negative for discharge.  Respiratory: Negative for cough.   Gastrointestinal: Negative for diarrhea and vomiting.  Neurological: Negative for headaches.  All other systems reviewed and are negative.      Objective:   Physical Exam  Constitutional: She is oriented to person, place, and time. She appears well-developed and well-nourished. No distress.  HENT:  Head: Normocephalic and atraumatic.  Mouth/Throat: Oropharynx is clear and moist.  Left ear cerumen impaction   Eyes: Pupils are equal, round, and reactive to light.  Cardiovascular: Normal rate, regular rhythm, normal heart sounds and intact distal pulses.   No murmur heard. Pulmonary/Chest: Effort normal and breath sounds normal. No respiratory distress. She has no wheezes.  Musculoskeletal: Normal range of motion. She exhibits no edema or tenderness.  Neurological: She is alert and oriented to person, place, and time.  Skin: Skin is warm and dry.  Psychiatric: She has a normal mood and affect. Her behavior is normal. Judgment and thought content normal.  Vitals reviewed.  Warm water and peroxide used to clean out ear. TM WNL  BP 124/63   Pulse 77   Temp (!) 97.4 F (36.3 C) (Oral)   Ht 5' (1.524 m)   Wt 174 lb 9.6 oz (79.2 kg)   BMI  34.10 kg/m      Assessment & Plan:  1. Impacted cerumen of right ear Do not stick anything into ears Can use OTC ear drops  RTO prn and keep follow up with PCP  Evelina Dun, FNP

## 2016-11-25 NOTE — Patient Instructions (Signed)
Earwax Buildup, Adult The ears produce a substance called earwax that helps keep bacteria out of the ear and protects the skin in the ear canal. Occasionally, earwax can build up in the ear and cause discomfort or hearing loss. What increases the risk? This condition is more likely to develop in people who:  Are female.  Are elderly.  Naturally produce more earwax.  Clean their ears often with cotton swabs.  Use earplugs often.  Use in-ear headphones often.  Wear hearing aids.  Have narrow ear canals.  Have earwax that is overly thick or sticky.  Have eczema.  Are dehydrated.  Have excess hair in the ear canal.  What are the signs or symptoms? Symptoms of this condition include:  Reduced or muffled hearing.  A feeling of fullness in the ear or feeling that the ear is plugged.  Fluid coming from the ear.  Ear pain.  Ear itch.  Ringing in the ear.  Coughing.  An obvious piece of earwax that can be seen inside the ear canal.  How is this diagnosed? This condition may be diagnosed based on:  Your symptoms.  Your medical history.  An ear exam. During the exam, your health care provider will look into your ear with an instrument called an otoscope.  You may have tests, including a hearing test. How is this treated? This condition may be treated by:  Using ear drops to soften the earwax.  Having the earwax removed by a health care provider. The health care provider may: ? Flush the ear with water. ? Use an instrument that has a loop on the end (curette). ? Use a suction device.  Surgery to remove the wax buildup. This may be done in severe cases.  Follow these instructions at home:  Take over-the-counter and prescription medicines only as told by your health care provider.  Do not put any objects, including cotton swabs, into your ear. You can clean the opening of your ear canal with a washcloth or facial tissue.  Follow instructions from your health  care provider about cleaning your ears. Do not over-clean your ears.  Drink enough fluid to keep your urine clear or pale yellow. This will help to thin the earwax.  Keep all follow-up visits as told by your health care provider. If earwax builds up in your ears often or if you use hearing aids, consider seeing your health care provider for routine, preventive ear cleanings. Ask your health care provider how often you should schedule your cleanings.  If you have hearing aids, clean them according to instructions from the manufacturer and your health care provider. Contact a health care provider if:  You have ear pain.  You develop a fever.  You have blood, pus, or other fluid coming from your ear.  You have hearing loss.  You have ringing in your ears that does not go away.  Your symptoms do not improve with treatment.  You feel like the room is spinning (vertigo). Summary  Earwax can build up in the ear and cause discomfort or hearing loss.  The most common symptoms of this condition include reduced or muffled hearing and a feeling of fullness in the ear or feeling that the ear is plugged.  This condition may be diagnosed based on your symptoms, your medical history, and an ear exam.  This condition may be treated by using ear drops to soften the earwax or by having the earwax removed by a health care provider.  Do   not put any objects, including cotton swabs, into your ear. You can clean the opening of your ear canal with a washcloth or facial tissue. This information is not intended to replace advice given to you by your health care provider. Make sure you discuss any questions you have with your health care provider. Document Released: 02/18/2004 Document Revised: 03/23/2016 Document Reviewed: 03/23/2016 Elsevier Interactive Patient Education  2018 [employer]  

## 2016-12-28 ENCOUNTER — Other Ambulatory Visit: Payer: Self-pay | Admitting: Family Medicine

## 2017-02-06 ENCOUNTER — Other Ambulatory Visit: Payer: Self-pay | Admitting: *Deleted

## 2017-02-06 MED ORDER — DULOXETINE HCL 60 MG PO CPEP: 60.0000 mg | ORAL_CAPSULE | Freq: Every day | ORAL | 0 refills | Status: DC

## 2017-02-08 ENCOUNTER — Ambulatory Visit (INDEPENDENT_AMBULATORY_CARE_PROVIDER_SITE_OTHER): Payer: Medicare Other | Admitting: Family Medicine

## 2017-02-08 ENCOUNTER — Encounter: Payer: Self-pay | Admitting: Family Medicine

## 2017-02-08 ENCOUNTER — Ambulatory Visit (INDEPENDENT_AMBULATORY_CARE_PROVIDER_SITE_OTHER): Payer: Medicare Other

## 2017-02-08 VITALS — BP 127/68 | HR 80 | Ht 60.0 in | Wt 175.0 lb

## 2017-02-08 DIAGNOSIS — M542 Cervicalgia: Secondary | ICD-10-CM

## 2017-02-08 DIAGNOSIS — M5412 Radiculopathy, cervical region: Secondary | ICD-10-CM

## 2017-02-08 MED ORDER — METHYLPREDNISOLONE ACETATE 80 MG/ML IJ SUSP
80.0000 mg | Freq: Once | INTRAMUSCULAR | Status: AC
  Administered 2017-02-08: 80 mg via INTRAMUSCULAR

## 2017-02-08 MED ORDER — PREDNISONE 10 MG (48) PO TBPK: ORAL_TABLET | ORAL | 0 refills | Status: DC

## 2017-02-08 NOTE — Progress Notes (Signed)
Subjective:  Patient ID: Shirley Brown, female    DOB: 1940-08-17  Age: 77 y.o. MRN: [id number]  CC: Neck Pain (pt here today c/o neck pain for the past 2 months and isn't getting any better. She said it "pops and grinds' when she moves it.)   HPI Jenne P. Brach presents for moderate pain in the posterior neck noted to the right and into the postauricular region.  She has some popping and grinding she notes.  This occurs when she rotates her neck.  There is been no he no signs of infection such as redness and swelling.  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   She has a past surgical history that includes Cholecystectomy; Joint replacement (Right, 1995); Joint replacement (Left, 2001); Appendectomy; and Throat surgery (Left).   Her family history includes Cancer in her brother; Diabetes in her son; Heart attack in her father; Heart disease in her father and sister; Hyperlipidemia in her father, sister, and son; Hypertension in her father and sister; Other in her sister.She reports that she quit smoking about 24 years ago. Her smoking use included cigarettes. she has never used smokeless tobacco. She reports that she does not drink alcohol or use drugs.    ROS Review of Systems  Constitutional: Negative for activity change, appetite change and fever.  HENT: Negative for congestion, rhinorrhea and sore throat.   Eyes:  Negative for visual disturbance.  Respiratory: Negative for cough and shortness of breath.   Cardiovascular: Negative for chest pain and palpitations.  Gastrointestinal: Negative for abdominal pain, diarrhea and nausea.  Genitourinary: Negative for dysuria.  Musculoskeletal: Negative for arthralgias and myalgias.    Objective:  BP 127/68   Pulse 80   Ht 5' (1.524 m)   Wt 175 lb (79.4 kg)   BMI 34.18 kg/m   BP Readings from Last 3 Encounters:  02/08/17 127/68  11/25/16 124/63  10/04/16 114/63    Wt Readings from Last 3 Encounters:  02/08/17 175 lb (79.4 kg)  11/25/16 174 lb 9.6 oz (79.2 kg)  10/04/16 175 lb (79.4 kg)     Physical Exam  Constitutional: She is oriented to person, place, and time. She appears well-developed and well-nourished. No distress.  Cardiovascular: Normal rate and regular rhythm.  Pulmonary/Chest: Breath sounds normal.  Musculoskeletal: She exhibits tenderness (However younoted at this is noted at the nuchal ridge and is tender for palpation and percussion into the postauricular region.).  Neurological: She is alert and oriented to person, place, and time.  Skin: Skin is warm and dry.  Psychiatric: She has a normal mood and affect.      Assessment & Plan:   [name] was seen today for neck pain.  Diagnoses and all orders for this visit:  Cervical pain (neck) -     DG Cervical Spine Complete; Future -  methylPREDNISolone acetate (DEPO-MEDROL) injection 80 mg  Cervical neuralgia  Other orders -     predniSONE (STERAPRED UNI-PAK 48 TAB) 10 MG (48) TBPK tablet; Take as directed       I have discontinued Deasha P. Short's fish oil-omega-3 fatty acids and ondansetron. I am also having her start on predniSONE. Additionally, I am having her maintain her Cyanocobalamin (VITAMIN B 12 PO), Vitamin D3, aspirin, vitamin E, levocetirizine, clotrimazole-betamethasone, amLODipine, acyclovir, fenofibrate, pravastatin, zolpidem, folic acid,  escitalopram, pantoprazole, and DULoxetine. We administered methylPREDNISolone acetate.  Allergies as of 02/08/2017      Reactions   Bupropion Other (See Comments)   Lips swelled   Sulfa Antibiotics Itching, Rash   Benadryl [diphenhydramine Hcl (sleep)] Other (See Comments)   Insomnia   Doxycycline Nausea And Vomiting   Dizziness   Desyrel [trazodone] Other (See Comments)   Keeps me up all night      Medication List        Accurate as of 02/08/17 11:59 PM. Always use your most recent med list.          acyclovir 400 MG tablet Commonly known as:  ZOVIRAX Take 1 tablet (400 mg total) by mouth daily.   amLODipine 5 MG tablet Commonly known as:  NORVASC Take 1 tablet (5 mg total) by mouth daily.   aspirin 81 MG tablet Take 81 mg by mouth daily.   clotrimazole-betamethasone cream Commonly known as:  LOTRISONE Apply 1 application topically 2 (two) times daily.   DULoxetine 60 MG capsule Commonly known as:  CYMBALTA Take 1 capsule (60 mg total) by mouth daily.   escitalopram 20 MG tablet Commonly known as:  LEXAPRO   fenofibrate 160 MG tablet Take 1 Tablet by mouth once daily FOR cholesterol AND triglyceride   folic acid 1 MG tablet Commonly known as:  FOLVITE Take 1 Tablet by mouth once daily   levocetirizine 5 MG tablet Commonly known as:  XYZAL Take 1 tablet (5 mg total) by mouth every evening.   pantoprazole 40 MG tablet Commonly known as:  PROTONIX TAKE 1 TABLET BY MOUTH 2 TIMES A DAY   pravastatin 10 MG tablet Commonly known as:  PRAVACHOL Take 1 tablet (10 mg total) by mouth at bedtime.   predniSONE 10 MG (48) Tbpk tablet Commonly known as:  STERAPRED UNI-PAK 48 TAB Take as directed   VITAMIN B 12 PO Take by mouth.   Vitamin D3 3000 units Tabs Take by mouth.   vitamin E 200 UNIT capsule Take 400 Units by mouth daily.   zolpidem 10 MG tablet Commonly known as:  AMBIEN Take 1 tablet (10 mg total) by mouth at bedtime as needed.         Follow-up: No Follow-up on file.  Claretta Fraise, M.D.

## 2017-02-10 ENCOUNTER — Encounter: Payer: Self-pay | Admitting: Family Medicine

## 2017-02-15 ENCOUNTER — Other Ambulatory Visit: Payer: Self-pay | Admitting: Family Medicine

## 2017-02-15 ENCOUNTER — Telehealth: Payer: Self-pay | Admitting: Family Medicine

## 2017-02-15 MED ORDER — PREDNISONE 10 MG (48) PO TBPK: ORAL_TABLET | ORAL | 0 refills | Status: DC

## 2017-02-15 NOTE — Telephone Encounter (Signed)
Please advise 

## 2017-02-15 NOTE — Telephone Encounter (Signed)
Please contact the patient It is okay to continue. Refill sent to pharmacy

## 2017-04-03 ENCOUNTER — Other Ambulatory Visit: Payer: Self-pay | Admitting: Family Medicine

## 2017-04-03 ENCOUNTER — Encounter: Payer: Self-pay | Admitting: Family Medicine

## 2017-04-03 ENCOUNTER — Ambulatory Visit (INDEPENDENT_AMBULATORY_CARE_PROVIDER_SITE_OTHER): Payer: Medicare Other | Admitting: Family Medicine

## 2017-04-03 VITALS — BP 124/65 | HR 87 | Temp 96.5°F | Ht 60.0 in | Wt 179.0 lb

## 2017-04-03 DIAGNOSIS — M542 Cervicalgia: Secondary | ICD-10-CM

## 2017-04-03 DIAGNOSIS — E782 Mixed hyperlipidemia: Secondary | ICD-10-CM | POA: Diagnosis not present

## 2017-04-03 DIAGNOSIS — I10 Essential (primary) hypertension: Secondary | ICD-10-CM | POA: Diagnosis not present

## 2017-04-03 LAB — CMP14+EGFR
ALBUMIN: 4.2 g/dL (ref 3.5–4.8)
ALT: 7 IU/L (ref 0–32)
AST: 19 IU/L (ref 0–40)
Albumin/Globulin Ratio: 2.2 (ref 1.2–2.2)
Alkaline Phosphatase: 57 IU/L (ref 39–117)
BUN / CREAT RATIO: 20 (ref 12–28)
BUN: 18 mg/dL (ref 8–27)
Bilirubin Total: 0.3 mg/dL (ref 0.0–1.2)
CO2: 24 mmol/L (ref 20–29)
CREATININE: 0.9 mg/dL (ref 0.57–1.00)
Calcium: 9.8 mg/dL (ref 8.7–10.3)
Chloride: 103 mmol/L (ref 96–106)
GFR calc Af Amer: 72 mL/min/{1.73_m2} (ref >59)
GFR, EST NON AFRICAN AMERICAN: 63 mL/min/{1.73_m2} (ref >59)
GLOBULIN, TOTAL: 1.9 g/dL (ref 1.5–4.5)
Glucose: 103 mg/dL — ABNORMAL HIGH (ref 65–99)
Potassium: 3.9 mmol/L (ref 3.5–5.2)
SODIUM: 143 mmol/L (ref 134–144)
TOTAL PROTEIN: 6.1 g/dL (ref 6.0–8.5)

## 2017-04-03 LAB — CBC WITH DIFFERENTIAL/PLATELET
Basophils Absolute: 0 10*3/uL (ref 0.0–0.2)
Basos: 1 %
EOS (ABSOLUTE): 0.2 10*3/uL (ref 0.0–0.4)
EOS: 3 %
HEMATOCRIT: 30.1 % — AB (ref 34.0–46.6)
HEMOGLOBIN: 9.8 g/dL — AB (ref 11.1–15.9)
IMMATURE GRANS (ABS): 0 10*3/uL (ref 0.0–0.1)
IMMATURE GRANULOCYTES: 0 %
LYMPHS ABS: 1.3 10*3/uL (ref 0.7–3.1)
Lymphs: 29 %
MCH: 30.1 pg (ref 26.6–33.0)
MCHC: 32.6 g/dL (ref 31.5–35.7)
MCV: 92 fL (ref 79–97)
MONOCYTES: 12 %
Monocytes Absolute: 0.5 10*3/uL (ref 0.1–0.9)
Neutrophils Absolute: 2.4 10*3/uL (ref 1.4–7.0)
Neutrophils: 55 %
Platelets: 270 10*3/uL (ref 150–379)
RBC: 3.26 x10E6/uL — AB (ref 3.77–5.28)
RDW: 15.1 % (ref 12.3–15.4)
WBC: 4.4 10*3/uL (ref 3.4–10.8)

## 2017-04-03 LAB — LIPID PANEL
CHOL/HDL RATIO: 2.9 ratio (ref 0.0–4.4)
Cholesterol, Total: 157 mg/dL (ref 100–199)
HDL: 55 mg/dL (ref >39)
LDL CALC: 77 mg/dL (ref 0–99)
Triglycerides: 123 mg/dL (ref 0–149)
VLDL CHOLESTEROL CAL: 25 mg/dL (ref 5–40)

## 2017-04-03 MED ORDER — DICLOFENAC SODIUM 75 MG PO TBEC: 75.0000 mg | DELAYED_RELEASE_TABLET | Freq: Two times a day (BID) | ORAL | 2 refills | Status: DC

## 2017-04-03 MED ORDER — CYCLOBENZAPRINE HCL 10 MG PO TABS: 10.0000 mg | ORAL_TABLET | Freq: Every day | ORAL | 2 refills | Status: DC

## 2017-04-03 MED ORDER — BETAMETHASONE SOD PHOS & ACET 6 (3-3) MG/ML IJ SUSP
6.0000 mg | Freq: Once | INTRAMUSCULAR | Status: AC
  Administered 2017-04-03: 6 mg via INTRAMUSCULAR

## 2017-04-03 MED ORDER — METHYLPREDNISOLONE 8 MG PO TABS: ORAL_TABLET | ORAL | 0 refills | Status: DC

## 2017-04-03 NOTE — Progress Notes (Signed)
Subjective:  Patient ID: Shirley Brown, female    DOB: [birth year]  Age: 77 y.o. MRN: [id number]  CC: Hyperlipidemia (pt here today for routine follow up of her chronic medical conditions and also is still having neck pain since Shirley Brown has finished the prednisone )   HPI Shirley Brown presents for  follow-up of hypertension. Patient has no history of headache chest pain or shortness of breath or recent cough. Patient also denies symptoms of TIA such as focal numbness or weakness. Patient checks  blood pressure at home. Readings recently good. Patient denies side effects from medication. States taking it regularly. Patient also reports that Shirley Brown did quite well while taking prednisone with regard to pain.  However it made her head fuzzy so Shirley Brown stopped taking it a few days early.  Unfortunately her pain returned to both her neck and back almost immediately and now it is right back where it was before Shirley Brown started the medicine. Patient in for follow-up of elevated cholesterol. Doing well without complaints on current medication. Denies side effects of statin including myalgia and arthralgia and nausea. Also in today for liver function testing. Currently no chest pain, shortness of breath or other cardiovascular related symptoms noted.   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   Shirley Brown has a past surgical history that includes Cholecystectomy; Joint replacement (Right, 1995); Joint replacement (Left, 2001); Appendectomy; and Throat surgery (Left).   Her family history includes Cancer in her brother; Diabetes in her son; Heart attack in her father; Heart disease in her father and sister; Hyperlipidemia in her father, sister, and son; Hypertension in her father and sister; Other in her sister.Shirley Brown reports that Shirley Brown quit smoking about 24 years ago. Her smoking use  included cigarettes. Shirley Brown has never used smokeless tobacco. Shirley Brown reports that Shirley Brown does not drink alcohol or use drugs.  Current Outpatient Medications on File Prior to Visit  Medication Sig Dispense Refill  . acyclovir (ZOVIRAX) 400 MG tablet Take 1 tablet (400 mg total) by mouth daily. 30 tablet 2  . amLODipine (NORVASC) 5 MG tablet Take 1 tablet (5 mg total) by mouth daily. 90 tablet 1  . aspirin 81 MG tablet Take 81 mg by mouth daily.    . Cholecalciferol (VITAMIN D3) 3000 UNITS TABS Take by mouth.    . clotrimazole-betamethasone (LOTRISONE) cream Apply 1 application topically 2 (two) times daily. 30 g 6  . Cyanocobalamin (VITAMIN B 12 PO) Take by mouth.    . DULoxetine (CYMBALTA) 60 MG capsule Take 1 capsule (60 mg total) by mouth daily. 90 capsule 0  . escitalopram (LEXAPRO) 20 MG tablet     . fenofibrate 160 MG tablet Take 1 Tablet by mouth once daily FOR cholesterol AND triglyceride 90 tablet 1  . folic acid (FOLVITE) 1 MG tablet Take 1 Tablet by mouth once daily 90 tablet 1  . levocetirizine (XYZAL) 5 MG tablet Take 1 tablet (5 mg total) by mouth every evening. 30 tablet 11  . pantoprazole (PROTONIX) 40 MG tablet TAKE 1 TABLET BY MOUTH 2 TIMES A DAY 180 tablet 1  . pravastatin (PRAVACHOL) 10 MG tablet Take 1 tablet (10 mg total) by mouth at bedtime. 90 tablet 1  . vitamin E 200 UNIT capsule Take 400 Units by mouth daily.     Marland Kitchen zolpidem (AMBIEN) 10 MG tablet Take 1 tablet (10 mg total) by mouth  at bedtime as needed. 30 tablet 5   No current facility-administered medications on file prior to visit.     ROS Review of Systems  Constitutional: Negative for activity change, appetite change and fever.  HENT: Negative for congestion, rhinorrhea and sore throat.   Eyes: Negative for visual disturbance.  Respiratory: Negative for cough and shortness of breath.   Cardiovascular: Negative for chest pain and palpitations.  Gastrointestinal: Negative for abdominal pain, diarrhea and nausea.    Genitourinary: Negative for dysuria.  Musculoskeletal: Positive for arthralgias, back pain, myalgias, neck pain and neck stiffness.    Objective:  BP 124/65   Pulse 87   Temp (!) 96.5 F (35.8 C) (Oral)   Ht 5' (1.524 m)   Wt 179 lb (81.2 kg)   BMI 34.96 kg/m   BP Readings from Last 3 Encounters:  04/03/17 124/65  02/08/17 127/68  11/25/16 124/63    Wt Readings from Last 3 Encounters:  04/03/17 179 lb (81.2 kg)  02/08/17 175 lb (79.4 kg)  11/25/16 174 lb 9.6 oz (79.2 kg)     Physical Exam  Constitutional: Shirley Brown is oriented to person, place, and time. Shirley Brown appears well-developed and well-nourished. No distress.  HENT:  Head: Normocephalic and atraumatic.  Eyes: Conjunctivae are normal. Pupils are equal, round, and reactive to light.  Neck: Normal range of motion. Neck supple. No thyromegaly present.  Cardiovascular: Normal rate, regular rhythm and normal heart sounds.  No murmur heard. Pulmonary/Chest: Effort normal and breath sounds normal. No respiratory distress. Shirley Brown has no wheezes. Shirley Brown has no rales.  Abdominal: Soft. Bowel sounds are normal. Shirley Brown exhibits no distension. There is no tenderness.  Musculoskeletal: Normal range of motion. Shirley Brown exhibits tenderness (Noted to the posterior neck with some crepitus.  Review of previously performed x-rays shows degenerative joint disease).  Lymphadenopathy:    Shirley Brown has no cervical adenopathy.  Neurological: Shirley Brown is alert and oriented to person, place, and time.  Skin: Skin is warm and dry.  Psychiatric: Shirley Brown has a normal mood and affect. Her behavior is normal. Judgment and thought content normal.      Assessment & Plan:   Meerab was seen today for hyperlipidemia.  Diagnoses and all orders for this visit:  Essential hypertension  Mixed hyperlipidemia -     CBC with Differential/Platelet -     CMP14+EGFR -     Lipid panel  Cervical pain (neck) -     betamethasone acetate-betamethasone sodium phosphate (CELESTONE)  injection 6 mg  Other orders -     cyclobenzaprine (FLEXERIL) 10 MG tablet; Take 1 tablet (10 mg total) by mouth at bedtime. -     diclofenac (VOLTAREN) 75 MG EC tablet; Take 1 tablet (75 mg total) by mouth 2 (two) times daily. -     methylPREDNISolone (MEDROL) 8 MG tablet; Take 2 by mouth twice daily for 2 weeks, then 1 by mouth twice daily for 2 weeks.   Allergies as of 04/03/2017      Reactions   Bupropion Other (See Comments)   Lips swelled   Sulfa Antibiotics Itching, Rash   Benadryl [diphenhydramine Hcl (sleep)] Other (See Comments)   Insomnia   Doxycycline Nausea And Vomiting   Dizziness   Desyrel [trazodone] Other (See Comments)   Keeps me up all night      Medication List        Accurate as of 04/03/17 11:13 AM. Always use your most recent med list.  acyclovir 400 MG tablet Commonly known as:  ZOVIRAX Take 1 tablet (400 mg total) by mouth daily.   amLODipine 5 MG tablet Commonly known as:  NORVASC Take 1 tablet (5 mg total) by mouth daily.   aspirin 81 MG tablet Take 81 mg by mouth daily.   clotrimazole-betamethasone cream Commonly known as:  LOTRISONE Apply 1 application topically 2 (two) times daily.   cyclobenzaprine 10 MG tablet Commonly known as:  FLEXERIL Take 1 tablet (10 mg total) by mouth at bedtime.   diclofenac 75 MG EC tablet Commonly known as:  VOLTAREN Take 1 tablet (75 mg total) by mouth 2 (two) times daily.   DULoxetine 60 MG capsule Commonly known as:  CYMBALTA Take 1 capsule (60 mg total) by mouth daily.   escitalopram 20 MG tablet Commonly known as:  LEXAPRO   fenofibrate 160 MG tablet Take 1 Tablet by mouth once daily FOR cholesterol AND triglyceride   folic acid 1 MG tablet Commonly known as:  FOLVITE Take 1 Tablet by mouth once daily   levocetirizine 5 MG tablet Commonly known as:  XYZAL Take 1 tablet (5 mg total) by mouth every evening.   methylPREDNISolone 8 MG tablet Commonly known as:  MEDROL Take 2 by  mouth twice daily for 2 weeks, then 1 by mouth twice daily for 2 weeks.   pantoprazole 40 MG tablet Commonly known as:  PROTONIX TAKE 1 TABLET BY MOUTH 2 TIMES A DAY   pravastatin 10 MG tablet Commonly known as:  PRAVACHOL Take 1 tablet (10 mg total) by mouth at bedtime.   VITAMIN B 12 PO Take by mouth.   Vitamin D3 3000 units Tabs Take by mouth.   vitamin E 200 UNIT capsule Take 400 Units by mouth daily.   zolpidem 10 MG tablet Commonly known as:  AMBIEN Take 1 tablet (10 mg total) by mouth at bedtime as needed.       Meds ordered this encounter  Medications  . betamethasone acetate-betamethasone sodium phosphate (CELESTONE) injection 6 mg  . cyclobenzaprine (FLEXERIL) 10 MG tablet    Sig: Take 1 tablet (10 mg total) by mouth at bedtime.    Dispense:  30 tablet    Refill:  2  . diclofenac (VOLTAREN) 75 MG EC tablet    Sig: Take 1 tablet (75 mg total) by mouth 2 (two) times daily.    Dispense:  60 tablet    Refill:  2  . methylPREDNISolone (MEDROL) 8 MG tablet    Sig: Take 2 by mouth twice daily for 2 weeks, then 1 by mouth twice daily for 2 weeks.    Dispense:  76 tablet    Refill:  0    Since the patient could not tolerate the prednisone will give her a trial of methylprednisolone and follow that with a long-term diclofenac prescription for maintenance.  For the muscle spasm Shirley Brown should use heat and.  Monitor for drowsiness  Flexeril at Bedtime As Needed.    Follow-up: Return in about 6 months (around 10/04/2017), or if symptoms worsen or fail to improve.  Claretta Fraise, M.D.

## 2017-04-03 NOTE — Patient Instructions (Signed)
Finish the Prednisone prior to starting the Diclofenac.

## 2017-04-21 ENCOUNTER — Encounter: Payer: Self-pay | Admitting: Family Medicine

## 2017-04-21 DIAGNOSIS — Z1231 Encounter for screening mammogram for malignant neoplasm of breast: Secondary | ICD-10-CM | POA: Diagnosis not present

## 2017-04-28 ENCOUNTER — Other Ambulatory Visit: Payer: Self-pay | Admitting: Family Medicine

## 2017-05-01 ENCOUNTER — Other Ambulatory Visit: Payer: Self-pay | Admitting: Family Medicine

## 2017-05-05 ENCOUNTER — Other Ambulatory Visit: Payer: Self-pay | Admitting: Family Medicine

## 2017-05-22 ENCOUNTER — Ambulatory Visit (INDEPENDENT_AMBULATORY_CARE_PROVIDER_SITE_OTHER): Payer: Medicare Other | Admitting: Family Medicine

## 2017-05-22 ENCOUNTER — Encounter: Payer: Self-pay | Admitting: Family Medicine

## 2017-05-22 VITALS — BP 118/64 | HR 87 | Ht 60.0 in | Wt 183.4 lb

## 2017-05-22 DIAGNOSIS — M5412 Radiculopathy, cervical region: Secondary | ICD-10-CM

## 2017-05-22 DIAGNOSIS — R202 Paresthesia of skin: Secondary | ICD-10-CM

## 2017-05-22 DIAGNOSIS — M25519 Pain in unspecified shoulder: Secondary | ICD-10-CM | POA: Diagnosis not present

## 2017-05-22 DIAGNOSIS — I10 Essential (primary) hypertension: Secondary | ICD-10-CM

## 2017-05-22 MED ORDER — DICLOFENAC SODIUM 75 MG PO TBEC: 75.0000 mg | DELAYED_RELEASE_TABLET | Freq: Two times a day (BID) | ORAL | 2 refills | Status: DC

## 2017-05-22 NOTE — Progress Notes (Signed)
Subjective:  Patient ID: Shirley Brown, female    DOB: 1940-07-26  Age: 77 y.o. MRN: [id number]  CC: Follow-up (pt here today following up on her neck pain which is somewhat better but still hurts and has tingling in different places on her body )   HPI Shirley Brown presents for Recheck of the pain in the posterior neck.  It is radiating up the occiput of the posterior scalp with just waves of numbness and pain that are moderately severe.  The pain extends from the nape of her neck to the base of the neck and particularly hurts at the right posterior base of the neck and extends just past the neck toward the shoulder but not all the way over to the ball of the shoulder.  Patient is also having some paresthesias primarily tingling in her hands and her toes.  She just finished an extended taper of methylprednisolone.  She says the cyclobenzaprine at bedtime helps.  Through misunderstanding she did not fill or take the diclofenac.  Patient has a history of vitamin B12 deficiency which may be contributing to the above-mentioned paresthesias.  Depression screen [facility] 2/9 05/22/2017 04/03/2017 11/25/2016  Decreased Interest '2 1 2  '$ Down, Depressed, Hopeless '2 1 2  '$ PHQ - 2 Score '4 2 4  '$ Altered sleeping 1 2 0  Tired, decreased energy 2 2 0  Change in appetite 1 1 0  Feeling bad or failure about yourself  0 0 0  Trouble concentrating 0 0 0  Moving slowly or fidgety/restless 1 0 0  Suicidal thoughts 0 0 0  PHQ-9 Score '9 7 4  '$ Difficult doing work/chores - - -  Some recent data might be hidden    History Shirley Brown has a past medical history of Allergy to meropenem, Anemia, Arthritis, Cancer ([place]), Carotid 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   She has a past surgical history that includes Cholecystectomy; Joint replacement (Right, 1995); Joint replacement (Left, 2001); Appendectomy; and Throat surgery (Left).   Her family history includes Cancer in her  brother; Diabetes in her son; Heart attack in her father; Heart disease in her father and sister; Hyperlipidemia in her father, sister, and son; Hypertension in her father and sister; Other in her sister.She reports that she quit smoking about 24 years ago. Her smoking use included cigarettes. She has never used smokeless tobacco. She reports that she does not drink alcohol or use drugs.    ROS Review of Systems  Constitutional: Negative.   HENT: Negative.   Eyes: Negative for visual disturbance.  Respiratory: Negative for shortness of breath.   Cardiovascular: Negative for chest pain.  Gastrointestinal: Negative for abdominal pain.  Musculoskeletal: Negative for arthralgias.    Objective:  BP 118/64   Pulse 87   Ht 5' (1.524 m)   Wt 183 lb 6 oz (83.2 kg)   BMI 35.81 kg/m    BP Readings from Last 3 Encounters:  05/22/17 118/64  04/03/17 124/65  02/08/17 127/68    Wt Readings from Last 3 Encounters:  05/22/17 183 lb 6 oz (83.2 kg)  04/03/17 179 lb (81.2 kg)  02/08/17 175 lb (79.4 kg)     Physical Exam  Constitutional: She is oriented to person, place, and time. She appears well-developed and well-nourished. No distress.  HENT:  Head: Normocephalic and atraumatic.  Right Ear: External ear normal.  Left Ear: External ear normal.  Nose: Nose normal.  Mouth/Throat: Oropharynx is clear and moist.  Eyes:  Pupils are equal, round, and reactive to light. Conjunctivae and EOM are normal.  Neck: Normal range of motion. Neck supple. No thyromegaly present.  Cardiovascular: Normal rate, regular rhythm and normal heart sounds.  No murmur heard. Pulmonary/Chest: Effort normal and breath sounds normal. No respiratory distress. She has no wheezes. She has no rales.  Abdominal: Soft. Bowel sounds are normal. She exhibits no distension. There is no tenderness.  Lymphadenopathy:    She has no cervical adenopathy.  Neurological: She is alert and oriented to person, place, and time. She  has normal reflexes.  Skin: Skin is warm and dry.  Psychiatric: She has a normal mood and affect. Her behavior is normal. Judgment and thought content normal.      Assessment & Plan:   Shirley Brown was seen today for follow-up.  Diagnoses and all orders for this visit:  Cervical radiculopathy -     Ambulatory referral to Physical Therapy -     MR CERVICAL SPINE W WO CONTRAST; Future  Paresthesia -     CBC with Differential/Platelet -     CMP14+EGFR -     Vitamin B12 -     Folate  Essential hypertension  Other orders -     diclofenac (VOLTAREN) 75 MG EC tablet; Take 1 tablet (75 mg total) by mouth 2 (two) times daily.       I have discontinued Shirley Brown's methylPREDNISolone. I am also having her maintain her Cyanocobalamin (VITAMIN B 12 PO), Vitamin D3, aspirin, vitamin E, levocetirizine, clotrimazole-betamethasone, amLODipine, acyclovir, fenofibrate, pravastatin, escitalopram, pantoprazole, cyclobenzaprine, zolpidem, DULoxetine, folic acid, and diclofenac.  Allergies as of 05/22/2017      Reactions   Bupropion Other (See Comments)   Lips swelled   Sulfa Antibiotics Itching, Rash   Benadryl [diphenhydramine Hcl (sleep)] Other (See Comments)   Insomnia   Doxycycline Nausea And Vomiting   Dizziness   Desyrel [trazodone] Other (See Comments)   Keeps me up all night      Medication List        Accurate as of 05/22/17 12:15 PM. Always use your most recent med list.          acyclovir 400 MG tablet Commonly known as:  ZOVIRAX Take 1 tablet (400 mg total) by mouth daily.   amLODipine 5 MG tablet Commonly known as:  NORVASC Take 1 tablet (5 mg total) by mouth daily.   aspirin 81 MG tablet Take 81 mg by mouth daily.   clotrimazole-betamethasone cream Commonly known as:  LOTRISONE Apply 1 application topically 2 (two) times daily.   cyclobenzaprine 10 MG tablet Commonly known as:  FLEXERIL Take 1 tablet (10 mg total) by mouth at bedtime.   diclofenac 75 MG  EC tablet Commonly known as:  VOLTAREN Take 1 tablet (75 mg total) by mouth 2 (two) times daily.   DULoxetine 60 MG capsule Commonly known as:  CYMBALTA TAKE 1 CAPSULE BY MOUTH EVERY DAY   escitalopram 20 MG tablet Commonly known as:  LEXAPRO   fenofibrate 160 MG tablet Take 1 Tablet by mouth once daily FOR cholesterol AND triglyceride   folic acid 1 MG tablet Commonly known as:  FOLVITE TAKE 1 TABLET BY MOUTH ONCE DAILY   levocetirizine 5 MG tablet Commonly known as:  XYZAL Take 1 tablet (5 mg total) by mouth every evening.   pantoprazole 40 MG tablet Commonly known as:  PROTONIX TAKE 1 TABLET BY MOUTH 2 TIMES A DAY   pravastatin 10 MG tablet Commonly known  as:  PRAVACHOL Take 1 tablet (10 mg total) by mouth at bedtime.   VITAMIN B 12 PO Take by mouth.   Vitamin D3 3000 units Tabs Take by mouth.   vitamin E 200 UNIT capsule Take 400 Units by mouth daily.   zolpidem 10 MG tablet Commonly known as:  AMBIEN TAKE 1 TABLET BY MOUTH AT BEDTIME AS NEEDED        Follow-up: Return in about 2 months (around 07/22/2017).  Claretta Fraise, M.D.

## 2017-05-22 NOTE — Patient Instructions (Signed)
Be sure to take Diclofenac

## 2017-05-23 LAB — CBC WITH DIFFERENTIAL/PLATELET
BASOS: 1 %
Basophils Absolute: 0.1 10*3/uL (ref 0.0–0.2)
EOS (ABSOLUTE): 0.2 10*3/uL (ref 0.0–0.4)
Eos: 4 %
Hematocrit: 28.9 % — ABNORMAL LOW (ref 34.0–46.6)
Hemoglobin: 9.1 g/dL — ABNORMAL LOW (ref 11.1–15.9)
IMMATURE GRANS (ABS): 0.1 10*3/uL (ref 0.0–0.1)
Immature Granulocytes: 1 %
LYMPHS: 28 %
Lymphocytes Absolute: 1.6 10*3/uL (ref 0.7–3.1)
MCH: 29.3 pg (ref 26.6–33.0)
MCHC: 31.5 g/dL (ref 31.5–35.7)
MCV: 93 fL (ref 79–97)
Monocytes Absolute: 0.8 10*3/uL (ref 0.1–0.9)
Monocytes: 15 %
NEUTROS ABS: 2.8 10*3/uL (ref 1.4–7.0)
Neutrophils: 51 %
PLATELETS: 320 10*3/uL (ref 150–379)
RBC: 3.11 x10E6/uL — ABNORMAL LOW (ref 3.77–5.28)
RDW: 16 % — ABNORMAL HIGH (ref 12.3–15.4)
WBC: 5.5 10*3/uL (ref 3.4–10.8)

## 2017-05-23 LAB — CMP14+EGFR
A/G RATIO: 2 (ref 1.2–2.2)
ALBUMIN: 4 g/dL (ref 3.5–4.8)
ALT: 13 IU/L (ref 0–32)
AST: 22 IU/L (ref 0–40)
Alkaline Phosphatase: 76 IU/L (ref 39–117)
BUN / CREAT RATIO: 19 (ref 12–28)
BUN: 18 mg/dL (ref 8–27)
Bilirubin Total: 0.3 mg/dL (ref 0.0–1.2)
CO2: 22 mmol/L (ref 20–29)
Calcium: 9.5 mg/dL (ref 8.7–10.3)
Chloride: 102 mmol/L (ref 96–106)
Creatinine, Ser: 0.93 mg/dL (ref 0.57–1.00)
GFR calc non Af Amer: 60 mL/min/{1.73_m2} (ref >59)
GFR, EST AFRICAN AMERICAN: 69 mL/min/{1.73_m2} (ref >59)
GLOBULIN, TOTAL: 2 g/dL (ref 1.5–4.5)
Glucose: 94 mg/dL (ref 65–99)
POTASSIUM: 4.5 mmol/L (ref 3.5–5.2)
SODIUM: 139 mmol/L (ref 134–144)
TOTAL PROTEIN: 6 g/dL (ref 6.0–8.5)

## 2017-05-23 LAB — VITAMIN B12: Vitamin B-12: >2000 pg/mL — ABNORMAL HIGH (ref 232–1245)

## 2017-05-23 LAB — FOLATE: Folate: >20 ng/mL (ref >3.0)

## 2017-05-25 LAB — IRON AND TIBC
IRON SATURATION: 31 % (ref 15–55)
IRON: 91 ug/dL (ref 27–139)
TIBC: 290 ug/dL (ref 250–450)
UIBC: 199 ug/dL (ref 118–369)

## 2017-05-25 LAB — TRANSFERRIN: TRANSFERRIN: 237 mg/dL (ref 200–370)

## 2017-05-25 LAB — SPECIMEN STATUS REPORT

## 2017-05-25 LAB — FERRITIN: Ferritin: 252 ng/mL — ABNORMAL HIGH (ref 15–150)

## 2017-05-29 DIAGNOSIS — M542 Cervicalgia: Secondary | ICD-10-CM | POA: Diagnosis not present

## 2017-05-29 DIAGNOSIS — M5412 Radiculopathy, cervical region: Secondary | ICD-10-CM | POA: Diagnosis not present

## 2017-05-30 ENCOUNTER — Ambulatory Visit (HOSPITAL_COMMUNITY)
Admission: RE | Admit: 2017-05-30 | Discharge: 2017-05-30 | Disposition: A | Discharge status: Home / Self Care | Payer: Medicare Other | Source: Ambulatory Visit | Attending: Family Medicine | Admitting: Family Medicine

## 2017-05-30 ENCOUNTER — Other Ambulatory Visit: Payer: Self-pay | Admitting: Family Medicine

## 2017-05-30 DIAGNOSIS — G935 Compression of brain: Secondary | ICD-10-CM | POA: Diagnosis not present

## 2017-05-30 DIAGNOSIS — M5013 Cervical disc disorder with radiculopathy, cervicothoracic region: Secondary | ICD-10-CM | POA: Diagnosis not present

## 2017-05-30 DIAGNOSIS — M5412 Radiculopathy, cervical region: Secondary | ICD-10-CM

## 2017-05-30 DIAGNOSIS — M47812 Spondylosis without myelopathy or radiculopathy, cervical region: Secondary | ICD-10-CM | POA: Diagnosis not present

## 2017-05-30 MED ORDER — GADOBENATE DIMEGLUMINE 529 MG/ML IV SOLN
15.0000 mL | Freq: Once | INTRAVENOUS | Status: AC | PRN
  Administered 2017-05-30: 15 mL via INTRAVENOUS

## 2017-06-01 ENCOUNTER — Telehealth: Payer: Self-pay | Admitting: *Deleted

## 2017-06-01 DIAGNOSIS — M4310 Spondylolisthesis, site unspecified: Secondary | ICD-10-CM

## 2017-06-01 NOTE — Telephone Encounter (Signed)
-----   Message from Claretta Fraise, MD sent at 05/31/2017  2:16 PM EDT ----- Hello Konica, Moderately severe arthritis. The vertebrae seem to be slipping forward. (anterolisthesis) Refer to neurosurgery. Avoid chiropractor for this problem! Best Regards, Claretta Fraise, M.D.

## 2017-06-07 ENCOUNTER — Encounter: Payer: Self-pay | Admitting: Family Medicine

## 2017-06-07 ENCOUNTER — Ambulatory Visit (INDEPENDENT_AMBULATORY_CARE_PROVIDER_SITE_OTHER): Payer: Medicare Other | Admitting: Family Medicine

## 2017-06-07 VITALS — BP 115/60 | HR 87 | Temp 97.7°F | Ht 60.0 in | Wt 178.0 lb

## 2017-06-07 DIAGNOSIS — M5412 Radiculopathy, cervical region: Secondary | ICD-10-CM | POA: Diagnosis not present

## 2017-06-07 MED ORDER — GABAPENTIN 300 MG PO CAPS: 300.0000 mg | ORAL_CAPSULE | Freq: Every day | ORAL | 1 refills | Status: DC

## 2017-06-07 NOTE — Progress Notes (Signed)
Subjective:  Patient ID: Shirley Brown, female    DOB: 04/23/1940  Age: 77 y.o. MRN: [id number]  CC: Neck Pain (pt here today following up on her neck pain and pt states she is waiting on appt with Neurosurgery)   HPI Shirley Brown presents for follow-up on her neck pain.  She states the pain has diminished somewhat it is still 7/10 even with medication.  She discontinued physical therapy when she was told she was being referred to neurosurgery.  She is continuing to use the diclofenac and cyclobenzaprine.  She is having or she has noticed some improvement in mobility.  However she notes that the pain still radiates up to the occiput and around to the right side of her scalp into the temporal region when she turns her head to the left.  Depression screen [facility] 2/9 06/07/2017 05/22/2017 04/03/2017  Decreased Interest 1 2 1   Down, Depressed, Hopeless 0 2 1  PHQ - 2 Score 1 4 2   Altered sleeping - 1 2  Tired, decreased energy - 2 2  Change in appetite - 1 1  Feeling bad or failure about yourself  - 0 0  Trouble concentrating - 0 0  Moving slowly or fidgety/restless - 1 0  Suicidal thoughts - 0 0  PHQ-9 Score - 9 7  Difficult doing work/chores - - -  Some recent data might be hidden    History Eilis has a past medical history of Allergy to meropenem, Anemia, Arthritis, Cancer ([place]), Carotid 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   She has a past surgical history that includes Cholecystectomy; Joint replacement (Right, 1995); Joint replacement (Left, 2001); Appendectomy; and Throat surgery (Left).   Her family history includes Cancer in her brother; Diabetes in her son; Heart attack in her father; Heart disease in her father and sister; Hyperlipidemia in her father, sister, and son; Hypertension in her father and sister; Other in her sister.She reports that she quit smoking about 24 years ago. Her smoking use included cigarettes. She has  never used smokeless tobacco. She reports that she does not drink alcohol or use drugs.    ROS Review of Systems  Constitutional: Negative.   HENT: Negative.   Eyes: Negative for visual disturbance.  Respiratory: Negative for shortness of breath.   Cardiovascular: Negative for chest pain.  Gastrointestinal: Negative for abdominal pain.  Musculoskeletal: Negative for arthralgias.    Objective:  BP 115/60   Pulse 87   Temp 97.7 F (36.5 C) (Oral)   Ht 5' (1.524 m)   Wt 178 lb (80.7 kg)   BMI 34.76 kg/m   BP Readings from Last 3 Encounters:  06/07/17 115/60  05/22/17 118/64  04/03/17 124/65    Wt Readings from Last 3 Encounters:  06/07/17 178 lb (80.7 kg)  05/22/17 183 lb 6 oz (83.2 kg)  04/03/17 179 lb (81.2 kg)     Physical Exam  Constitutional: She is oriented to person, place, and time. She appears well-developed and well-nourished. No distress.  Cardiovascular: Normal rate and regular rhythm.  Pulmonary/Chest: Breath sounds normal.  Neurological: She is alert and oriented to person, place, and time.  Skin: Skin is warm and dry.  Psychiatric: She has a normal mood and affect.      Assessment & Plan:   Saquoia was seen today for neck pain.  Diagnoses and all orders for this visit:  Cervical radiculopathy  Cervical neuralgia  Other orders -     gabapentin (  NEURONTIN) 300 MG capsule; Take 1 capsule (300 mg total) by mouth at bedtime.       I have discontinued Tyrell P. Forgue's cyclobenzaprine. I am also having her start on gabapentin. Additionally, I am having her maintain her Cyanocobalamin (VITAMIN B 12 PO), Vitamin D3, aspirin, vitamin E, levocetirizine, clotrimazole-betamethasone, amLODipine, fenofibrate, pravastatin, escitalopram, pantoprazole, zolpidem, DULoxetine, folic acid, diclofenac, and acyclovir.  Allergies as of 06/07/2017      Reactions   Bupropion Other (See Comments)   Lips swelled   Sulfa Antibiotics Itching, Rash   Benadryl  [diphenhydramine Hcl (sleep)] Other (See Comments)   Insomnia   Doxycycline Nausea And Vomiting   Dizziness   Desyrel [trazodone] Other (See Comments)   Keeps me up all night      Medication List        Accurate as of 06/07/17 10:12 PM. Always use your most recent med list.          acyclovir 400 MG tablet Commonly known as:  ZOVIRAX TAKE 1 TABLET BY MOUTH ONCE DAILY   amLODipine 5 MG tablet Commonly known as:  NORVASC Take 1 tablet (5 mg total) by mouth daily.   aspirin 81 MG tablet Take 81 mg by mouth daily.   clotrimazole-betamethasone cream Commonly known as:  LOTRISONE Apply 1 application topically 2 (two) times daily.   diclofenac 75 MG EC tablet Commonly known as:  VOLTAREN Take 1 tablet (75 mg total) by mouth 2 (two) times daily.   DULoxetine 60 MG capsule Commonly known as:  CYMBALTA TAKE 1 CAPSULE BY MOUTH EVERY DAY   escitalopram 20 MG tablet Commonly known as:  LEXAPRO   fenofibrate 160 MG tablet Take 1 Tablet by mouth once daily FOR cholesterol AND triglyceride   folic acid 1 MG tablet Commonly known as:  FOLVITE TAKE 1 TABLET BY MOUTH ONCE DAILY   gabapentin 300 MG capsule Commonly known as:  NEURONTIN Take 1 capsule (300 mg total) by mouth at bedtime.   levocetirizine 5 MG tablet Commonly known as:  XYZAL Take 1 tablet (5 mg total) by mouth every evening.   pantoprazole 40 MG tablet Commonly known as:  PROTONIX TAKE 1 TABLET BY MOUTH 2 TIMES A DAY   pravastatin 10 MG tablet Commonly known as:  PRAVACHOL Take 1 tablet (10 mg total) by mouth at bedtime.   VITAMIN B 12 PO Take by mouth.   Vitamin D3 3000 units Tabs Take by mouth.   vitamin E 200 UNIT capsule Take 400 Units by mouth daily.   zolpidem 10 MG tablet Commonly known as:  AMBIEN TAKE 1 TABLET BY MOUTH AT BEDTIME AS NEEDED      Resume Physical therapy.  Follow-up: Return in about 1 month (around 07/08/2017).  Claretta Fraise, M.D.

## 2017-06-18 DIAGNOSIS — I83891 Varicose veins of right lower extremities with other complications: Secondary | ICD-10-CM | POA: Diagnosis not present

## 2017-06-18 DIAGNOSIS — I83811 Varicose veins of right lower extremities with pain: Secondary | ICD-10-CM | POA: Diagnosis not present

## 2017-06-18 DIAGNOSIS — Z79899 Other long term (current) drug therapy: Secondary | ICD-10-CM | POA: Diagnosis not present

## 2017-06-18 DIAGNOSIS — Z87891 Personal history of nicotine dependence: Secondary | ICD-10-CM | POA: Diagnosis not present

## 2017-06-18 DIAGNOSIS — Z7982 Long term (current) use of aspirin: Secondary | ICD-10-CM | POA: Diagnosis not present

## 2017-06-27 ENCOUNTER — Other Ambulatory Visit: Payer: Self-pay | Admitting: Family Medicine

## 2017-06-28 ENCOUNTER — Other Ambulatory Visit: Payer: Self-pay | Admitting: Family Medicine

## 2017-06-28 ENCOUNTER — Other Ambulatory Visit: Payer: Self-pay | Admitting: Family

## 2017-07-01 ENCOUNTER — Other Ambulatory Visit: Payer: Self-pay | Admitting: Family Medicine

## 2017-07-10 ENCOUNTER — Ambulatory Visit: Payer: Medicare Other | Admitting: Family Medicine

## 2017-07-12 ENCOUNTER — Encounter: Payer: Self-pay | Admitting: Family Medicine

## 2017-07-13 DIAGNOSIS — M47812 Spondylosis without myelopathy or radiculopathy, cervical region: Secondary | ICD-10-CM | POA: Diagnosis not present

## 2017-07-13 DIAGNOSIS — G935 Compression of brain: Secondary | ICD-10-CM | POA: Diagnosis not present

## 2017-07-21 ENCOUNTER — Encounter: Payer: Self-pay | Admitting: Family Medicine

## 2017-07-21 ENCOUNTER — Other Ambulatory Visit: Payer: Self-pay | Admitting: Family Medicine

## 2017-07-21 ENCOUNTER — Ambulatory Visit (INDEPENDENT_AMBULATORY_CARE_PROVIDER_SITE_OTHER): Payer: Medicare Other | Admitting: Family Medicine

## 2017-07-21 VITALS — BP 115/65 | HR 86 | Temp 97.1°F | Ht 60.0 in | Wt 172.0 lb

## 2017-07-21 DIAGNOSIS — B3781 Candidal esophagitis: Secondary | ICD-10-CM | POA: Diagnosis not present

## 2017-07-21 DIAGNOSIS — B370 Candidal stomatitis: Secondary | ICD-10-CM | POA: Diagnosis not present

## 2017-07-21 DIAGNOSIS — G935 Compression of brain: Secondary | ICD-10-CM

## 2017-07-21 MED ORDER — CLOTRIMAZOLE 10 MG MT TROC: 10.0000 mg | Freq: Every day | OROMUCOSAL | 0 refills | Status: AC

## 2017-07-21 NOTE — Progress Notes (Signed)
Subjective:  Patient ID: Shirley Brown, female    DOB: 01/23/1941  Age: 77 y.o. MRN: [id number]  CC: Neck Pain (pt here today f/u on her neck and head which she saw Neuro for and has papers here for review)   HPI Fable Plaster Capelle presents for follow-up on her neck pain.  Dr. 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  He has recommended that she be seen in a major [facility] and has referred her to come health.  This is because he could not determine whether her symptoms were coming from the Chiari malformation are not and whether surgery would be beneficial or not.  Patient continues to have moderate to severe pain.  She is getting some relief with the current regimen.  Depression screen [facility] 2/9 06/07/2017 05/22/2017 04/03/2017  Decreased Interest 1 2 1   Down, Depressed, Hopeless 0 2 1  PHQ - 2 Score 1 4 2   Altered sleeping - 1 2  Tired, decreased energy - 2 2  Change in appetite - 1 1  Feeling bad or failure about yourself  - 0 0  Trouble concentrating - 0 0  Moving slowly or fidgety/restless - 1 0  Suicidal thoughts - 0 0  PHQ-9 Score - 9 7  Difficult doing work/chores - - -  Some recent data might be hidden    History Lamiya has a past medical history of Allergy to meropenem, Anemia, Arthritis, Cancer ([place]), Carotid 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   She has a past surgical history that includes Cholecystectomy; Joint replacement (Right, 1995); Joint replacement (Left, 2001); Appendectomy; and Throat surgery (Left).   Her family history includes Cancer in her brother; Diabetes in her son; Heart attack in her father; Heart disease in her father and sister; Hyperlipidemia in her father, sister, and son; Hypertension in her father and sister; Other in her sister.She reports that she quit smoking about 24 years ago. Her smoking use included cigarettes. She has never used smokeless tobacco. She reports that  she does not drink alcohol or use drugs.    ROS Review of Systems  Constitutional: Negative.   HENT: Positive for mouth sores (Gray plaques in mouth). Negative for trouble swallowing.   Eyes: Negative for visual disturbance.  Respiratory: Negative for shortness of breath.   Cardiovascular: Negative for chest pain.  Gastrointestinal: Negative for abdominal pain.  Musculoskeletal: Negative for arthralgias.    Objective:  BP 115/65   Pulse 86   Temp (!) 97.1 F (36.2 C) (Oral)   Ht 5' (1.524 m)   Wt 172 lb (78 kg)   BMI 33.59 kg/m   BP Readings from Last 3 Encounters:  07/21/17 115/65  06/07/17 115/60  05/22/17 118/64    Wt Readings from Last 3 Encounters:  07/21/17 172 lb (78 kg)  06/07/17 178 lb (80.7 kg)  05/22/17 183 lb 6 oz (83.2 kg)     Physical Exam  Constitutional: She is oriented to person, place, and time. She appears well-developed and well-nourished. No distress.  Cardiovascular: Normal rate and regular rhythm.  Pulmonary/Chest: Breath sounds normal.  Neurological: She is alert and oriented to person, place, and time.  Skin: Skin is warm and dry.  Psychiatric: She has a normal mood and affect.      Assessment & Plan:   [name] was seen today for neck pain.  Diagnoses and all orders for this visit:  Chiari malformation type I ([place])  Thrush of mouth and  esophagus ([place])  Other orders -     clotrimazole (MYCELEX) 10 MG troche; Take 1 tablet (10 mg total) by mouth 5 (five) times daily for 7 days. Allow to dissolve in mouth       I have discontinued Blenda P. Krawiec's gabapentin. I am also having her start on clotrimazole. Additionally, I am having her maintain her Cyanocobalamin (VITAMIN B 12 PO), Vitamin D3, aspirin, vitamin E, levocetirizine, clotrimazole-betamethasone, fenofibrate, escitalopram, zolpidem, DULoxetine, folic acid, diclofenac, acyclovir, pravastatin, pantoprazole, and amLODipine.  Allergies as of 07/21/2017      Reactions    Bupropion Other (See Comments)   Lips swelled   Sulfa Antibiotics Itching, Rash   Benadryl [diphenhydramine Hcl (sleep)] Other (See Comments)   Insomnia   Doxycycline Nausea And Vomiting   Dizziness   Gabapentin Other (See Comments)   Drunk feeling, staggering   Desyrel [trazodone] Other (See Comments)   Keeps me up all night      Medication List        Accurate as of 07/21/17  5:37 PM. Always use your most recent med list.          acyclovir 400 MG tablet Commonly known as:  ZOVIRAX TAKE 1 TABLET BY MOUTH ONCE DAILY   amLODipine 5 MG tablet Commonly known as:  NORVASC TAKE 1 TABLET BY MOUTH ONCE DAILY   aspirin 81 MG tablet Take 81 mg by mouth daily.   clotrimazole 10 MG troche Commonly known as:  MYCELEX Take 1 tablet (10 mg total) by mouth 5 (five) times daily for 7 days. Allow to dissolve in mouth   clotrimazole-betamethasone cream Commonly known as:  LOTRISONE Apply 1 application topically 2 (two) times daily.   diclofenac 75 MG EC tablet Commonly known as:  VOLTAREN Take 1 tablet (75 mg total) by mouth 2 (two) times daily.   DULoxetine 60 MG capsule Commonly known as:  CYMBALTA TAKE 1 CAPSULE BY MOUTH EVERY DAY   escitalopram 20 MG tablet Commonly known as:  LEXAPRO   fenofibrate 160 MG tablet Take 1 Tablet by mouth once daily FOR cholesterol AND triglyceride   folic acid 1 MG tablet Commonly known as:  FOLVITE TAKE 1 TABLET BY MOUTH ONCE DAILY   levocetirizine 5 MG tablet Commonly known as:  XYZAL Take 1 tablet (5 mg total) by mouth every evening.   pantoprazole 40 MG tablet Commonly known as:  PROTONIX TAKE 1 TABLET BY MOUTH TWICE A DAY   pravastatin 10 MG tablet Commonly known as:  PRAVACHOL TAKE 1 TABLET BY MOUTH AT BEDTIME   VITAMIN B 12 PO Take by mouth.   Vitamin D3 3000 units Tabs Take by mouth.   vitamin E 200 UNIT capsule Take 400 Units by mouth daily.   zolpidem 10 MG tablet Commonly known as:  AMBIEN TAKE 1 TABLET BY  MOUTH AT BEDTIME AS NEEDED        Follow-up: Return in about 4 months (around 11/20/2017).  Claretta Fraise, M.D.

## 2017-07-22 ENCOUNTER — Other Ambulatory Visit: Payer: Self-pay | Admitting: Family Medicine

## 2017-07-24 ENCOUNTER — Other Ambulatory Visit: Payer: Self-pay | Admitting: Family Medicine

## 2017-07-27 ENCOUNTER — Other Ambulatory Visit: Payer: Self-pay | Admitting: Physician Assistant

## 2017-08-18 ENCOUNTER — Telehealth: Payer: Self-pay | Admitting: Family Medicine

## 2017-08-19 ENCOUNTER — Other Ambulatory Visit: Payer: Self-pay | Admitting: Family Medicine

## 2017-08-28 ENCOUNTER — Encounter: Payer: Self-pay | Admitting: Nurse Practitioner

## 2017-08-28 ENCOUNTER — Ambulatory Visit (INDEPENDENT_AMBULATORY_CARE_PROVIDER_SITE_OTHER): Payer: Medicare Other

## 2017-08-28 ENCOUNTER — Other Ambulatory Visit: Payer: Self-pay | Admitting: Nurse Practitioner

## 2017-08-28 ENCOUNTER — Ambulatory Visit (INDEPENDENT_AMBULATORY_CARE_PROVIDER_SITE_OTHER): Payer: Medicare Other | Admitting: Nurse Practitioner

## 2017-08-28 VITALS — BP 153/70 | HR 88 | Temp 97.7°F | Ht 60.0 in | Wt 167.0 lb

## 2017-08-28 DIAGNOSIS — B370 Candidal stomatitis: Secondary | ICD-10-CM

## 2017-08-28 DIAGNOSIS — D509 Iron deficiency anemia, unspecified: Secondary | ICD-10-CM

## 2017-08-28 DIAGNOSIS — B3781 Candidal esophagitis: Secondary | ICD-10-CM

## 2017-08-28 DIAGNOSIS — R198 Other specified symptoms and signs involving the digestive system and abdomen: Secondary | ICD-10-CM | POA: Diagnosis not present

## 2017-08-28 DIAGNOSIS — K5900 Constipation, unspecified: Secondary | ICD-10-CM | POA: Diagnosis not present

## 2017-08-28 LAB — HEMOGLOBIN, FINGERSTICK: Hemoglobin: 10.2 g/dL — ABNORMAL LOW (ref 11.1–15.9)

## 2017-08-28 MED ORDER — FIRST-DUKES MOUTHWASH MT SUSP: 5.0000 mL | Freq: Four times a day (QID) | OROMUCOSAL | 1 refills | Status: DC

## 2017-08-28 NOTE — Progress Notes (Signed)
   Subjective:    Patient ID: Shirley Brown, female    DOB: Oct 24, 1940, 77 y.o.   MRN: [id number]   Chief Complaint: Constipation   HPI Patient comes in today c/o constipation. Her last bowl movement was 2-3 weeks ago. She has taken laxatives and they have nit helped. She the took a dulcolax and that did not help.  She has a long history of intermittent constipation.  * she is also c/o mouth being sore. Gums bled when she brushes her teeth. She has seen dr. Livia Snellen for this  And he gave her clotrimazole.    * has history of anemia- last hgb was 9.1 in April 2019- will recheck today Review of Systems  Constitutional: Negative.   HENT: Negative.   Respiratory: Negative.   Cardiovascular: Negative.   Gastrointestinal: Positive for constipation. Negative for diarrhea, nausea and vomiting.  Genitourinary: Negative.   Neurological: Negative.   Psychiatric/Behavioral: Negative.   All other systems reviewed and are negative.      Objective:   Physical Exam  Constitutional: She is oriented to person, place, and time. She appears well-developed and well-nourished. No distress.  HENT:  Mouth/Throat: No oropharyngeal exudate, posterior oropharyngeal edema, posterior oropharyngeal erythema or tonsillar abscesses.  Slight white film on back of tongue- nothing seen on buccal mucosa.  Cardiovascular: Normal rate and regular rhythm.  Pulmonary/Chest: Effort normal.  Abdominal: Soft. She exhibits no mass. There is no tenderness. There is no rebound and no guarding.  Neurological: She is alert and oriented to person, place, and time.  Skin: Skin is warm.  Psychiatric: She has a normal mood and affect. Her behavior is normal. Thought content normal.   BP (!) 153/70   Pulse 88   Temp 97.7 F (36.5 C) (Oral)   Ht 5' (1.524 m)   Wt 167 lb (75.8 kg)   BMI 32.61 kg/m   KUB- moderate stool buren      Assessment & Plan:  Jasmain Plaster Ibarra in today with chief complaint of  Constipation   1. Change in bowel movement Magnesium citrate Start miralax daily on apple juice Increase fiber in diet - DG Abd 1 View; Future  2. Thrush of mouth and esophagus ([place]) Meds ordered this encounter  Medications  . Diphenhyd-Hydrocort-Nystatin (FIRST-DUKES MOUTHWASH) SUSP    Sig: Use as directed 5 mLs in the mouth or throat 4 (four) times daily.    Dispense:  237 mL    Refill:  1    Order Specific Question:   Supervising Provider    Answer:   VINCENT, CAROL L [[id number]]   3. Iron deficiency anemia -hgb pendiing Needs to make follow up appointment with PCP   Mary-Margaret Hassell Done, FNP

## 2017-08-28 NOTE — Patient Instructions (Signed)

## 2017-08-29 ENCOUNTER — Other Ambulatory Visit: Payer: Self-pay | Admitting: Family Medicine

## 2017-09-15 ENCOUNTER — Other Ambulatory Visit: Payer: Self-pay | Admitting: Family Medicine

## 2017-09-18 DIAGNOSIS — I10 Essential (primary) hypertension: Secondary | ICD-10-CM | POA: Diagnosis not present

## 2017-09-18 DIAGNOSIS — G935 Compression of brain: Secondary | ICD-10-CM | POA: Diagnosis not present

## 2017-09-18 DIAGNOSIS — M5481 Occipital neuralgia: Secondary | ICD-10-CM | POA: Diagnosis not present

## 2017-09-27 ENCOUNTER — Other Ambulatory Visit: Payer: Self-pay | Admitting: Family

## 2017-09-27 ENCOUNTER — Other Ambulatory Visit: Payer: Self-pay | Admitting: Family Medicine

## 2017-09-28 NOTE — Telephone Encounter (Signed)
Ov 11/22/17

## 2017-10-09 ENCOUNTER — Other Ambulatory Visit: Payer: Self-pay | Admitting: Family Medicine

## 2017-10-09 NOTE — Telephone Encounter (Signed)
Last office visit 07-21-17

## 2017-10-11 ENCOUNTER — Ambulatory Visit: Payer: Medicare Other | Admitting: Family Medicine

## 2017-10-11 ENCOUNTER — Ambulatory Visit (INDEPENDENT_AMBULATORY_CARE_PROVIDER_SITE_OTHER): Payer: Medicare Other | Admitting: Family Medicine

## 2017-10-11 ENCOUNTER — Encounter: Payer: Self-pay | Admitting: Family Medicine

## 2017-10-11 VITALS — BP 139/77 | HR 96 | Temp 98.1°F | Ht 60.0 in | Wt 163.8 lb

## 2017-10-11 DIAGNOSIS — B370 Candidal stomatitis: Secondary | ICD-10-CM

## 2017-10-11 DIAGNOSIS — R609 Edema, unspecified: Secondary | ICD-10-CM

## 2017-10-11 DIAGNOSIS — R0600 Dyspnea, unspecified: Secondary | ICD-10-CM

## 2017-10-11 DIAGNOSIS — B3781 Candidal esophagitis: Secondary | ICD-10-CM | POA: Diagnosis not present

## 2017-10-11 DIAGNOSIS — R600 Localized edema: Secondary | ICD-10-CM

## 2017-10-11 MED ORDER — FLUCONAZOLE 150 MG PO TABS: ORAL_TABLET | ORAL | 0 refills | Status: DC

## 2017-10-11 MED ORDER — HYDROXYZINE PAMOATE 25 MG PO CAPS: 25.0000 mg | ORAL_CAPSULE | Freq: Two times a day (BID) | ORAL | 0 refills | Status: DC | PRN

## 2017-10-11 NOTE — Progress Notes (Signed)
Subjective:  Patient ID: Shirley Brown, female    DOB: 1940/09/23  Age: 77 y.o. MRN: [id number]  CC: Leg Swelling (worse in evening) and yeast in mouth   HPI Shirley Brown presents for soreness in her mouth and on the tongue.  This was not relieved by recently prescribed miracle mouthwash.  She would like to try Diflucan.  Additionally she is having problems with swelling at the ankles.  She has some ongoing fatigue and shortness of breath in addition to the swelling.  She just feels bad all over.  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   She has a past surgical history that includes Cholecystectomy; Joint replacement (Right, 1995); Joint replacement (Left, 2001); Appendectomy; and Throat surgery (Left).   Her family history includes Cancer in her brother; Diabetes in her son; Heart attack in her father; Heart disease in her father and sister; Hyperlipidemia in her father, sister, and son; Hypertension in her father and sister; Other in her sister.She reports that she quit smoking about 24 years ago. Her smoking use included cigarettes. She has never used smokeless tobacco. She reports that she does not drink alcohol or use drugs.    ROS Review of Systems  Constitutional: Negative.   HENT: Negative for congestion.   Eyes: Negative for visual disturbance.  Respiratory:  Positive for shortness of breath.   Cardiovascular: Positive for leg swelling. Negative for chest pain.  Gastrointestinal: Negative for abdominal pain, diarrhea and vomiting.  Genitourinary: Negative for difficulty urinating.  Musculoskeletal: Negative for arthralgias and myalgias.  Neurological: Negative for headaches.  Psychiatric/Behavioral: Positive for sleep disturbance.    Objective:  BP 139/77   Pulse 96   Temp 98.1 F (36.7 C) (Oral)   Ht 5' (1.524 m)   Wt 163 lb 12.8 oz (74.3 kg)   BMI 31.99 kg/m   BP Readings from Last 3 Encounters:  10/11/17 139/77  08/28/17 (!) 153/70  07/21/17 115/65    Wt Readings from Last 3 Encounters:  10/11/17 163 lb 12.8 oz (74.3 kg)  08/28/17 167 lb (75.8 kg)  07/21/17 172 lb (78 kg)     Physical Exam  Constitutional: She is oriented to person, place, and time. She appears well-developed and well-nourished. She appears distressed (Distraught).  HENT:  Head: Normocephalic and atraumatic.  Right Ear: External ear normal.  Left Ear: External ear normal.  Nose: Nose normal.  Mouth/Throat: Oropharynx is clear and moist.  Eyes: Pupils are equal, round, and reactive to light. Conjunctivae and EOM are normal.  Neck: Normal range of motion. Neck supple. No thyromegaly present.  Cardiovascular: Normal rate, regular rhythm and normal heart sounds.  No murmur heard. Pulmonary/Chest: Effort normal and breath sounds normal. No respiratory distress. She has no wheezes. She has no rales.  Abdominal: Soft. Bowel  sounds are normal. She exhibits no distension. There is no tenderness.  Musculoskeletal: She exhibits edema (1-2+ at the ankles just above her sock line).  Lymphadenopathy:    She has no cervical adenopathy.  Neurological: She is alert and oriented to person, place, and time. She has normal reflexes.  Skin: Skin is warm and dry.  Psychiatric: Her behavior is normal. Thought content normal. Her mood appears anxious. Her speech is rapid  and/or pressured. Cognition and memory are normal.      Assessment & Plan:   Shirley Brown was seen today for leg swelling and yeast in mouth.  Diagnoses and all orders for this visit:  Localized edema  Swelling -     BMP8+EGFR -     CMP14+EGFR -     Brain natriuretic peptide  Dyspnea, unspecified type -     Brain natriuretic peptide  Thrush of mouth and esophagus (HCC)  Other orders -     hydrOXYzine (VISTARIL) 25 MG capsule; Take 1 capsule (25 mg total) by mouth 2 (two) times daily as needed. -     fluconazole (DIFLUCAN) 150 MG tablet; Take two at onset, then one daily for 13 days       I have discontinued Shirley Brown's escitalopram. I am also having her start on hydrOXYzine and fluconazole. Additionally, I am having her maintain her Cyanocobalamin (VITAMIN B 12 PO), Vitamin D3, aspirin, vitamin E, levocetirizine, clotrimazole-betamethasone, folic acid, amLODipine, fenofibrate, acyclovir, DULoxetine, FIRST-DUKES MOUTHWASH, pantoprazole, diclofenac, pravastatin, and zolpidem.  Allergies as of 10/11/2017      Reactions   Bupropion Other (See Comments)   Lips swelled   Sulfa Antibiotics Itching, Rash   Benadryl [diphenhydramine Hcl (sleep)] Other (See Comments)   Insomnia   Doxycycline Nausea And Vomiting   Dizziness   Gabapentin Other (See Comments)   Drunk feeling, staggering   Desyrel [trazodone] Other (See Comments)   Keeps me up all night      Medication List        Accurate as of 10/11/17  5:38 PM. Always use your most recent med list.          acyclovir 400 MG tablet Commonly known as:  ZOVIRAX TAKE 1 TABLET BY MOUTH ONCE DAILY   amLODipine 5 MG tablet Commonly known as:  NORVASC TAKE 1 TABLET BY MOUTH ONCE DAILY   aspirin 81 MG tablet Take 81 mg by mouth daily.   clotrimazole-betamethasone cream Commonly known as:  LOTRISONE Apply 1 application topically 2 (two) times daily.   diclofenac 75 MG EC tablet Commonly known as:  VOLTAREN TAKE 1  TABLET BY MOUTH TWICE A DAY   DULoxetine 60 MG capsule Commonly known as:  CYMBALTA TAKE 1 CAPSULE BY MOUTH EVERY DAY   fenofibrate 160 MG tablet TAKE 1 TABLET BY MOUTH ONCE DAILY FOR CHOLESTEROL AND TRIGLYCERIDES   FIRST-DUKES MOUTHWASH Susp USE AS DIRECTED 5 MLS IN THE MOUTH OR THROAT 4 (FOUR) TIMES DAILY.   fluconazole 150 MG tablet Commonly known as:  DIFLUCAN Take two at onset, then one daily for 13 days   folic acid 1 MG tablet Commonly known as:  FOLVITE TAKE 1 TABLET BY MOUTH ONCE DAILY   hydrOXYzine 25 MG capsule Commonly known as:  VISTARIL Take 1 capsule (25 mg total) by mouth 2 (two) times daily as needed.   levocetirizine 5 MG tablet Commonly known as:  XYZAL Take 1 tablet (5 mg total) by mouth every evening.   pantoprazole 40 MG tablet Commonly known  as:  PROTONIX TAKE 1 TABLET BY MOUTH TWICE A DAY   pravastatin 10 MG tablet Commonly known as:  PRAVACHOL TAKE 1 TABLET BY MOUTH AT BEDTIME   VITAMIN B 12 PO Take by mouth.   Vitamin D3 3000 units Tabs Take by mouth.   vitamin E 200 UNIT capsule Take 400 Units by mouth daily.   zolpidem 10 MG tablet Commonly known as:  AMBIEN TAKE 1 TABLET BY MOUTH EVERY DAY AT BEDTIME AS NEEDED        Follow-up: Return in about 6 weeks (around 11/22/2017).  Claretta Fraise, M.D.

## 2017-10-12 DIAGNOSIS — I10 Essential (primary) hypertension: Secondary | ICD-10-CM | POA: Diagnosis not present

## 2017-10-12 DIAGNOSIS — M5481 Occipital neuralgia: Secondary | ICD-10-CM | POA: Diagnosis not present

## 2017-10-12 LAB — CMP14+EGFR
ALT: 14 IU/L (ref 0–32)
AST: 43 IU/L — ABNORMAL HIGH (ref 0–40)
Albumin/Globulin Ratio: 1.9 (ref 1.2–2.2)
Albumin: 4.5 g/dL (ref 3.5–4.8)
Alkaline Phosphatase: 56 IU/L (ref 39–117)
BILIRUBIN TOTAL: 0.9 mg/dL (ref 0.0–1.2)
BUN/Creatinine Ratio: 22 (ref 12–28)
BUN: 25 mg/dL (ref 8–27)
CHLORIDE: 93 mmol/L — AB (ref 96–106)
CO2: 21 mmol/L (ref 20–29)
Calcium: 10.6 mg/dL — ABNORMAL HIGH (ref 8.7–10.3)
Creatinine, Ser: 1.14 mg/dL — ABNORMAL HIGH (ref 0.57–1.00)
GFR calc non Af Amer: 47 mL/min/{1.73_m2} — ABNORMAL LOW (ref >59)
GFR, EST AFRICAN AMERICAN: 54 mL/min/{1.73_m2} — AB (ref >59)
GLUCOSE: 90 mg/dL (ref 65–99)
Globulin, Total: 2.4 g/dL (ref 1.5–4.5)
POTASSIUM: 3.9 mmol/L (ref 3.5–5.2)
Sodium: 132 mmol/L — ABNORMAL LOW (ref 134–144)
TOTAL PROTEIN: 6.9 g/dL (ref 6.0–8.5)

## 2017-10-12 LAB — BRAIN NATRIURETIC PEPTIDE: BNP: 145.2 pg/mL — AB (ref 0.0–100.0)

## 2017-10-13 ENCOUNTER — Other Ambulatory Visit: Payer: Self-pay | Admitting: Family Medicine

## 2017-10-13 MED ORDER — LISINOPRIL-HYDROCHLOROTHIAZIDE 10-12.5 MG PO TABS: 1.0000 | ORAL_TABLET | Freq: Every day | ORAL | 3 refills | Status: DC

## 2017-10-14 ENCOUNTER — Other Ambulatory Visit: Payer: Self-pay | Admitting: Family Medicine

## 2017-10-20 ENCOUNTER — Telehealth: Payer: Self-pay | Admitting: Family Medicine

## 2017-10-20 ENCOUNTER — Other Ambulatory Visit: Payer: Self-pay | Admitting: Family Medicine

## 2017-10-20 DIAGNOSIS — K5900 Constipation, unspecified: Secondary | ICD-10-CM

## 2017-10-20 MED ORDER — LINACLOTIDE 145 MCG PO CAPS: 145.0000 ug | ORAL_CAPSULE | Freq: Every day | ORAL | 5 refills | Status: DC

## 2017-10-20 NOTE — Telephone Encounter (Signed)
Please contact the patient I sent in linzess take daily for constipation. Add miralax prn only. I ordered GI for colonoscopy

## 2017-10-20 NOTE — Telephone Encounter (Signed)
Patient had seen MMM on 08/28/17 for constipation and was advised to use Miralax.  This has not seemed to help and she is wanting to have a colonoscopy to make sure everything is okay.  Please advise.

## 2017-10-21 NOTE — Telephone Encounter (Signed)
Patient aware and verbalizes understanding. 

## 2017-10-23 ENCOUNTER — Other Ambulatory Visit: Payer: Self-pay | Admitting: Family Medicine

## 2017-10-24 ENCOUNTER — Encounter: Payer: Self-pay | Admitting: Internal Medicine

## 2017-10-26 ENCOUNTER — Telehealth: Payer: Self-pay | Admitting: Family Medicine

## 2017-10-26 ENCOUNTER — Other Ambulatory Visit: Payer: Self-pay | Admitting: Family Medicine

## 2017-10-26 MED ORDER — LISINOPRIL 10 MG PO TABS: 10.0000 mg | ORAL_TABLET | Freq: Every day | ORAL | 3 refills | Status: DC

## 2017-10-26 NOTE — Telephone Encounter (Signed)
Aware of medicine change.

## 2017-10-26 NOTE — Telephone Encounter (Signed)
Please have her DC lisinopril HCTZ and take Lisinopril 10 mg daily.

## 2017-10-26 NOTE — Telephone Encounter (Signed)
Patient reports increasing fatigue since you discontinued her Diclofenac and she has started the [place].  Please advise.

## 2017-10-26 NOTE — Telephone Encounter (Signed)
Medication list faxed to [facility] @ [phone]

## 2017-10-28 DIAGNOSIS — R5383 Other fatigue: Secondary | ICD-10-CM | POA: Diagnosis not present

## 2017-10-28 DIAGNOSIS — N390 Urinary tract infection, site not specified: Secondary | ICD-10-CM | POA: Diagnosis not present

## 2017-10-29 DIAGNOSIS — R5383 Other fatigue: Secondary | ICD-10-CM | POA: Diagnosis not present

## 2017-10-30 ENCOUNTER — Telehealth: Payer: Self-pay | Admitting: *Deleted

## 2017-10-30 NOTE — Telephone Encounter (Signed)
Patient states her blood pressure yesterday morning after she took medication 91/46, 80/40, 87/38. Then yesterday afternoon was 156/77, 148/66/ Went to ER Saturday for dehydration and they diagnosed for UTI and given antibiotic. Patient given for tomorrow with [facility].

## 2017-10-31 ENCOUNTER — Encounter: Payer: Self-pay | Admitting: Family Medicine

## 2017-10-31 ENCOUNTER — Ambulatory Visit (INDEPENDENT_AMBULATORY_CARE_PROVIDER_SITE_OTHER): Payer: Medicare Other | Admitting: Family Medicine

## 2017-10-31 ENCOUNTER — Encounter (INDEPENDENT_AMBULATORY_CARE_PROVIDER_SITE_OTHER): Payer: Self-pay

## 2017-10-31 ENCOUNTER — Ambulatory Visit (INDEPENDENT_AMBULATORY_CARE_PROVIDER_SITE_OTHER): Payer: Medicare Other

## 2017-10-31 VITALS — BP 131/73 | HR 98 | Temp 98.1°F | Resp 20 | Ht 60.0 in | Wt 153.4 lb

## 2017-10-31 DIAGNOSIS — R399 Unspecified symptoms and signs involving the genitourinary system: Secondary | ICD-10-CM

## 2017-10-31 DIAGNOSIS — R531 Weakness: Secondary | ICD-10-CM

## 2017-10-31 DIAGNOSIS — R1310 Dysphagia, unspecified: Secondary | ICD-10-CM | POA: Diagnosis not present

## 2017-10-31 DIAGNOSIS — R0602 Shortness of breath: Secondary | ICD-10-CM | POA: Diagnosis not present

## 2017-10-31 LAB — URINALYSIS, COMPLETE
BILIRUBIN UA: NEGATIVE
GLUCOSE, UA: NEGATIVE
Ketones, UA: NEGATIVE
Nitrite, UA: NEGATIVE
PH UA: 6 (ref 5.0–7.5)
PROTEIN UA: NEGATIVE
RBC, UA: NEGATIVE
Specific Gravity, UA: <1.005 — ABNORMAL LOW (ref 1.005–1.030)
UUROB: 0.2 mg/dL (ref 0.2–1.0)

## 2017-10-31 LAB — MICROSCOPIC EXAMINATION
BACTERIA UA: NONE SEEN
RBC, UA: NONE SEEN /hpf (ref 0–2)
RENAL EPITHEL UA: NONE SEEN /HPF

## 2017-10-31 MED ORDER — AMLODIPINE BESYLATE 2.5 MG PO TABS: 2.5000 mg | ORAL_TABLET | Freq: Every day | ORAL | 2 refills | Status: DC

## 2017-10-31 MED ORDER — DULOXETINE HCL 30 MG PO CPEP: 30.0000 mg | ORAL_CAPSULE | Freq: Every day | ORAL | 1 refills | Status: DC

## 2017-10-31 NOTE — Progress Notes (Signed)
Subjective:  Patient ID: Shirley Brown, female    DOB: [birth year]  Age: 77 y.o. MRN: [id number]  CC: Fatigue and Hypotension   HPI Shirley Brown presents for fatigue and weakness ongoing since her last visit here 2-3 weeks ago.  Since that time symptoms have increased.  She has had no fever chills or sweats.  She has had a decreased appetite and feels that she is having trouble swallowing.  She was noted to have hypotension on home blood pressure checks.  She was seen in urgent care 3 days ago and found to have a decreased hemoglobin of 9.2 as well as evidence for urinary tract infection.  Her daughter is concerned for dehydration.  She reports an occasional cough .  She denies shortness of breath and chest pain. 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   She has a past surgical history that includes Cholecystectomy; Joint replacement (Right, 1995); Joint replacement (Left, 2001); Appendectomy; and Throat surgery (Left).   Her family history includes Cancer in her brother; Diabetes in her son; Heart attack in her father; Heart disease in her father and sister; Hyperlipidemia in her father, sister, and son; Hypertension in her father and sister; Other in her sister.She reports that she quit smoking about 24 years ago. Her smoking use included cigarettes. She has never  used smokeless tobacco. She reports that she does not drink alcohol or use drugs.    ROS Review of Systems  Constitutional: Positive for fatigue. Negative for fever.  HENT: Positive for mouth sores (Not relieved by the Diflucan or Magic mouthwash before that) and trouble swallowing. Negative for congestion.   Eyes: Negative for visual disturbance.  Respiratory: Negative for shortness of breath.   Cardiovascular: Negative for chest pain.  Gastrointestinal: Negative for abdominal pain, constipation, diarrhea, nausea and vomiting.  Genitourinary: Negative for difficulty urinating and dysuria.  Musculoskeletal: Negative for arthralgias and myalgias.  Neurological: Positive for weakness (Generalized, nonfocal). Negative for headaches.  Psychiatric/Behavioral: Negative for sleep disturbance.   Chest x-ray shows borderline cardiomegaly no acute changes. Objective:  BP 131/73   Pulse 98   Temp 98.1 F (36.7 C) (Oral)   Resp 20   Ht 5' (1.524 m)   Wt 153 lb 6.4 oz (69.6 kg)   SpO2 98%   BMI 29.96 kg/m   BP Readings from Last 3 Encounters:  10/31/17 131/73  10/11/17 139/77  08/28/17 (!) 153/70    Wt Readings from Last 3 Encounters:  10/31/17 153 lb 6.4 oz (69.6 kg)  10/11/17 163 lb 12.8 oz (74.3 kg)  08/28/17 167 lb (75.8 kg)     Physical Exam    Assessment & Plan:   Shirley Brown was seen today for fatigue and hypotension.  Diagnoses and all orders for this visit:  Dysphagia, unspecified  type -     Ambulatory referral to ENT  UTI symptoms -     Urinalysis, Complete -     Urine Culture; Future -     Urine Culture  Weakness -     Urinalysis, Complete -     Urine Culture; Future -     Urine Culture -     Cancel: CBC with Differential/Platelet -     Cancel: CMP14+EGFR -     DG Chest 2 View; Future -     CBC with Differential/Platelet -     CMP14+EGFR  Other orders -     amLODipine (NORVASC) 2.5 MG tablet; Take 1 tablet (2.5 mg total) by mouth daily. -      DULoxetine (CYMBALTA) 30 MG capsule; Take 1 capsule (30 mg total) by mouth daily.       I have discontinued Shirley Brown's vitamin E, levocetirizine, clotrimazole-betamethasone, FIRST-DUKES MOUTHWASH, pravastatin, fluconazole, fenofibrate, and lisinopril. I have also changed her amLODipine and DULoxetine. Additionally, I am having her maintain her Cyanocobalamin (VITAMIN B 12 PO), Vitamin D3, aspirin, folic acid, acyclovir, pantoprazole, zolpidem, hydrOXYzine, linaclotide, and nitrofurantoin (macrocrystal-monohydrate).  Allergies as of 10/31/2017      Reactions   Bupropion Other (See Comments)   Lips swelled   Sulfa Antibiotics Itching, Rash   Benadryl [diphenhydramine Hcl (sleep)] Other (See Comments)   Insomnia   Doxycycline Nausea And Vomiting   Dizziness   Gabapentin Other (See Comments)   Drunk feeling, staggering   Desyrel [trazodone] Other (See Comments)   Keeps me up all night      Medication List        Accurate as of 10/31/17 12:52 PM. Always use your most recent med list.          acyclovir 400 MG tablet Commonly known as:  ZOVIRAX TAKE 1 TABLET BY MOUTH ONCE DAILY   amLODipine 2.5 MG tablet Commonly known as:  NORVASC Take 1 tablet (2.5 mg total) by mouth daily.   aspirin 81 MG tablet Take 81 mg by mouth daily.   DULoxetine 30 MG capsule Commonly known as:  CYMBALTA Take 1 capsule (30 mg total) by mouth daily.   folic acid 1 MG tablet Commonly known as:  FOLVITE TAKE 1 TABLET BY MOUTH ONCE DAILY   hydrOXYzine 25 MG capsule Commonly known as:  VISTARIL Take 1 capsule (25 mg total) by mouth 2 (two) times daily as needed.   linaclotide 145 MCG Caps capsule Commonly known as:  LINZESS Take 1 capsule (145 mcg total) by mouth daily. To regulate bowel movements   nitrofurantoin (macrocrystal-monohydrate) 100 MG capsule Commonly known as:  MACROBID Take by mouth.   pantoprazole 40 MG tablet Commonly known as:  PROTONIX TAKE 1 TABLET BY MOUTH TWICE  A DAY   VITAMIN B 12 PO Take by mouth.   Vitamin D3 3000 units Tabs Take by mouth.   zolpidem 10 MG tablet Commonly known as:  AMBIEN TAKE 1 TABLET BY MOUTH EVERY DAY AT BEDTIME AS NEEDED      Unexpected weight loss of 10 pounds in the last 20 days as noted.  However her urinalysis showed specific gravity to be low.  No physical signs of dehydration otherwise were noted.  Blood work pending.  Several of her medicines were discontinued or dose is decreased.  Follow-up: Return in about 1 week (around 11/07/2017), or if symptoms worsen or fail to improve.  Claretta Fraise, M.D.

## 2017-11-01 ENCOUNTER — Other Ambulatory Visit: Payer: Self-pay | Admitting: Family Medicine

## 2017-11-01 ENCOUNTER — Telehealth: Payer: Self-pay | Admitting: *Deleted

## 2017-11-01 LAB — CBC WITH DIFFERENTIAL/PLATELET
BASOS: 0 %
Basophils Absolute: 0 10*3/uL (ref 0.0–0.2)
EOS (ABSOLUTE): 0.2 10*3/uL (ref 0.0–0.4)
EOS: 3 %
Hematocrit: 26.7 % — ABNORMAL LOW (ref 34.0–46.6)
Hemoglobin: 9 g/dL — ABNORMAL LOW (ref 11.1–15.9)
IMMATURE GRANS (ABS): 0 10*3/uL (ref 0.0–0.1)
Immature Granulocytes: 0 %
LYMPHS: 20 %
Lymphocytes Absolute: 1 10*3/uL (ref 0.7–3.1)
MCH: 28.8 pg (ref 26.6–33.0)
MCHC: 33.7 g/dL (ref 31.5–35.7)
MCV: 86 fL (ref 79–97)
Monocytes Absolute: 0.7 10*3/uL (ref 0.1–0.9)
Monocytes: 14 %
NEUTROS ABS: 3.2 10*3/uL (ref 1.4–7.0)
NEUTROS PCT: 63 %
PLATELETS: 325 10*3/uL (ref 150–450)
RBC: 3.12 x10E6/uL — ABNORMAL LOW (ref 3.77–5.28)
RDW: 13.7 % (ref 12.3–15.4)
WBC: 5.1 10*3/uL (ref 3.4–10.8)

## 2017-11-01 LAB — CMP14+EGFR
A/G RATIO: 1.7 (ref 1.2–2.2)
ALBUMIN: 4.5 g/dL (ref 3.5–4.8)
ALT: 20 IU/L (ref 0–32)
AST: 62 IU/L — ABNORMAL HIGH (ref 0–40)
Alkaline Phosphatase: 49 IU/L (ref 39–117)
BILIRUBIN TOTAL: 0.9 mg/dL (ref 0.0–1.2)
BUN / CREAT RATIO: 20 (ref 12–28)
BUN: 22 mg/dL (ref 8–27)
CHLORIDE: 89 mmol/L — AB (ref 96–106)
CO2: 20 mmol/L (ref 20–29)
Calcium: 10.6 mg/dL — ABNORMAL HIGH (ref 8.7–10.3)
Creatinine, Ser: 1.12 mg/dL — ABNORMAL HIGH (ref 0.57–1.00)
GFR calc non Af Amer: 48 mL/min/{1.73_m2} — ABNORMAL LOW (ref >59)
GFR, EST AFRICAN AMERICAN: 55 mL/min/{1.73_m2} — AB (ref >59)
Globulin, Total: 2.6 g/dL (ref 1.5–4.5)
Glucose: 101 mg/dL — ABNORMAL HIGH (ref 65–99)
POTASSIUM: 4 mmol/L (ref 3.5–5.2)
Sodium: 126 mmol/L — ABNORMAL LOW (ref 134–144)
TOTAL PROTEIN: 7.1 g/dL (ref 6.0–8.5)

## 2017-11-01 LAB — URINE CULTURE

## 2017-11-01 MED ORDER — CLOTRIMAZOLE 10 MG MT TROC: OROMUCOSAL | 2 refills | Status: DC

## 2017-11-01 NOTE — Telephone Encounter (Signed)
Patient called requesting ENT referral.  She left a voicemail with request, unable to reach patient by phone to find out why referral is needed.  Number busy on several attempts.

## 2017-11-01 NOTE — Telephone Encounter (Signed)
That referral was put in yesterday.

## 2017-11-02 ENCOUNTER — Other Ambulatory Visit: Payer: Self-pay | Admitting: Family Medicine

## 2017-11-02 NOTE — Telephone Encounter (Signed)
Patient aware.

## 2017-11-06 ENCOUNTER — Emergency Department (HOSPITAL_COMMUNITY): Payer: Medicare Other

## 2017-11-06 ENCOUNTER — Encounter (HOSPITAL_COMMUNITY): Payer: Self-pay | Admitting: Emergency Medicine

## 2017-11-06 ENCOUNTER — Other Ambulatory Visit: Payer: Self-pay

## 2017-11-06 ENCOUNTER — Inpatient Hospital Stay (HOSPITAL_COMMUNITY)
Admission: EM | Admit: 2017-11-06 | Discharge: 2017-11-09 | DRG: 640 | Disposition: A | Discharge status: Home / Self Care | Payer: Medicare Other | Attending: Internal Medicine | Admitting: Internal Medicine

## 2017-11-06 ENCOUNTER — Ambulatory Visit: Payer: Medicare Other | Admitting: *Deleted

## 2017-11-06 DIAGNOSIS — R627 Adult failure to thrive: Secondary | ICD-10-CM | POA: Diagnosis not present

## 2017-11-06 DIAGNOSIS — R739 Hyperglycemia, unspecified: Secondary | ICD-10-CM | POA: Diagnosis not present

## 2017-11-06 DIAGNOSIS — E876 Hypokalemia: Secondary | ICD-10-CM | POA: Diagnosis present

## 2017-11-06 DIAGNOSIS — K5900 Constipation, unspecified: Secondary | ICD-10-CM | POA: Diagnosis present

## 2017-11-06 DIAGNOSIS — R740 Nonspecific elevation of levels of transaminase and lactic acid dehydrogenase [LDH]: Secondary | ICD-10-CM | POA: Diagnosis not present

## 2017-11-06 DIAGNOSIS — E871 Hypo-osmolality and hyponatremia: Secondary | ICD-10-CM | POA: Diagnosis not present

## 2017-11-06 DIAGNOSIS — R51 Headache: Secondary | ICD-10-CM | POA: Diagnosis not present

## 2017-11-06 DIAGNOSIS — Z85818 Personal history of malignant neoplasm of other sites of lip, oral cavity, and pharynx: Secondary | ICD-10-CM | POA: Diagnosis not present

## 2017-11-06 DIAGNOSIS — R7989 Other specified abnormal findings of blood chemistry: Secondary | ICD-10-CM | POA: Diagnosis not present

## 2017-11-06 DIAGNOSIS — D638 Anemia in other chronic diseases classified elsewhere: Secondary | ICD-10-CM | POA: Diagnosis not present

## 2017-11-06 DIAGNOSIS — B029 Zoster without complications: Secondary | ICD-10-CM | POA: Diagnosis not present

## 2017-11-06 DIAGNOSIS — E782 Mixed hyperlipidemia: Secondary | ICD-10-CM | POA: Diagnosis not present

## 2017-11-06 DIAGNOSIS — Z7982 Long term (current) use of aspirin: Secondary | ICD-10-CM

## 2017-11-06 DIAGNOSIS — T502X5A Adverse effect of carbonic-anhydrase inhibitors, benzothiadiazides and other diuretics, initial encounter: Secondary | ICD-10-CM | POA: Diagnosis present

## 2017-11-06 DIAGNOSIS — I340 Nonrheumatic mitral (valve) insufficiency: Secondary | ICD-10-CM | POA: Diagnosis not present

## 2017-11-06 DIAGNOSIS — J029 Acute pharyngitis, unspecified: Secondary | ICD-10-CM | POA: Diagnosis not present

## 2017-11-06 DIAGNOSIS — B370 Candidal stomatitis: Secondary | ICD-10-CM | POA: Diagnosis not present

## 2017-11-06 DIAGNOSIS — Z87891 Personal history of nicotine dependence: Secondary | ICD-10-CM | POA: Diagnosis not present

## 2017-11-06 DIAGNOSIS — I214 Non-ST elevation (NSTEMI) myocardial infarction: Secondary | ICD-10-CM

## 2017-11-06 DIAGNOSIS — K219 Gastro-esophageal reflux disease without esophagitis: Secondary | ICD-10-CM | POA: Diagnosis not present

## 2017-11-06 DIAGNOSIS — I213 ST elevation (STEMI) myocardial infarction of unspecified site: Secondary | ICD-10-CM | POA: Diagnosis present

## 2017-11-06 DIAGNOSIS — F411 Generalized anxiety disorder: Secondary | ICD-10-CM | POA: Diagnosis present

## 2017-11-06 DIAGNOSIS — R7401 Elevation of levels of liver transaminase levels: Secondary | ICD-10-CM | POA: Diagnosis present

## 2017-11-06 DIAGNOSIS — E785 Hyperlipidemia, unspecified: Secondary | ICD-10-CM | POA: Diagnosis not present

## 2017-11-06 DIAGNOSIS — D649 Anemia, unspecified: Secondary | ICD-10-CM | POA: Diagnosis present

## 2017-11-06 DIAGNOSIS — Z882 Allergy status to sulfonamides status: Secondary | ICD-10-CM

## 2017-11-06 DIAGNOSIS — Z888 Allergy status to other drugs, medicaments and biological substances status: Secondary | ICD-10-CM

## 2017-11-06 DIAGNOSIS — Z881 Allergy status to other antibiotic agents status: Secondary | ICD-10-CM

## 2017-11-06 DIAGNOSIS — Z79899 Other long term (current) drug therapy: Secondary | ICD-10-CM

## 2017-11-06 DIAGNOSIS — Z8249 Family history of ischemic heart disease and other diseases of the circulatory system: Secondary | ICD-10-CM

## 2017-11-06 DIAGNOSIS — R778 Other specified abnormalities of plasma proteins: Secondary | ICD-10-CM | POA: Diagnosis present

## 2017-11-06 DIAGNOSIS — Z96653 Presence of artificial knee joint, bilateral: Secondary | ICD-10-CM | POA: Diagnosis not present

## 2017-11-06 DIAGNOSIS — E860 Dehydration: Secondary | ICD-10-CM | POA: Diagnosis present

## 2017-11-06 DIAGNOSIS — R634 Abnormal weight loss: Secondary | ICD-10-CM | POA: Diagnosis not present

## 2017-11-06 DIAGNOSIS — R1310 Dysphagia, unspecified: Secondary | ICD-10-CM | POA: Diagnosis not present

## 2017-11-06 DIAGNOSIS — I10 Essential (primary) hypertension: Secondary | ICD-10-CM | POA: Diagnosis present

## 2017-11-06 DIAGNOSIS — L989 Disorder of the skin and subcutaneous tissue, unspecified: Secondary | ICD-10-CM

## 2017-11-06 DIAGNOSIS — J384 Edema of larynx: Secondary | ICD-10-CM | POA: Diagnosis not present

## 2017-11-06 LAB — CBC WITH DIFFERENTIAL/PLATELET
ABS IMMATURE GRANULOCYTES: 0.01 10*3/uL (ref 0.00–0.07)
Basophils Absolute: 0 10*3/uL (ref 0.0–0.1)
Basophils Relative: 1 %
Eosinophils Absolute: 0.2 10*3/uL (ref 0.0–0.5)
Eosinophils Relative: 4 %
HCT: 26.5 % — ABNORMAL LOW (ref 36.0–46.0)
HEMOGLOBIN: 9.4 g/dL — AB (ref 12.0–15.0)
Immature Granulocytes: 0 %
LYMPHS ABS: 1.1 10*3/uL (ref 0.7–4.0)
LYMPHS PCT: 27 %
MCH: 32.5 pg (ref 26.0–34.0)
MCHC: 35.5 g/dL (ref 30.0–36.0)
MCV: 91.7 fL (ref 80.0–100.0)
MONO ABS: 0.7 10*3/uL (ref 0.1–1.0)
MONOS PCT: 17 %
NEUTROS ABS: 2.1 10*3/uL (ref 1.7–7.7)
Neutrophils Relative %: 51 %
PLATELETS: 290 10*3/uL (ref 150–400)
RBC: 2.89 MIL/uL — AB (ref 3.87–5.11)
RDW: 16.1 % — AB (ref 11.5–15.5)
WBC: 4.1 10*3/uL (ref 4.0–10.5)
nRBC: 0 % (ref 0.0–0.2)

## 2017-11-06 LAB — PHOSPHORUS: PHOSPHORUS: 2.3 mg/dL — AB (ref 2.5–4.6)

## 2017-11-06 LAB — HEPATIC FUNCTION PANEL
ALT: 25 U/L (ref 0–44)
AST: 61 U/L — ABNORMAL HIGH (ref 15–41)
Albumin: 4.4 g/dL (ref 3.5–5.0)
Alkaline Phosphatase: 42 U/L (ref 38–126)
BILIRUBIN DIRECT: 0.3 mg/dL — AB (ref 0.0–0.2)
Indirect Bilirubin: 0.8 mg/dL (ref 0.3–0.9)
TOTAL PROTEIN: 7.7 g/dL (ref 6.5–8.1)
Total Bilirubin: 1.1 mg/dL (ref 0.3–1.2)

## 2017-11-06 LAB — URINALYSIS, ROUTINE W REFLEX MICROSCOPIC
Bilirubin Urine: NEGATIVE
GLUCOSE, UA: NEGATIVE mg/dL
HGB URINE DIPSTICK: NEGATIVE
Ketones, ur: NEGATIVE mg/dL
Leukocytes, UA: NEGATIVE
Nitrite: NEGATIVE
PH: 7 (ref 5.0–8.0)
Protein, ur: NEGATIVE mg/dL
SPECIFIC GRAVITY, URINE: 1.005 (ref 1.005–1.030)

## 2017-11-06 LAB — BASIC METABOLIC PANEL
Anion gap: 9 (ref 5–15)
BUN: 13 mg/dL (ref 8–23)
CHLORIDE: 93 mmol/L — AB (ref 98–111)
CO2: 23 mmol/L (ref 22–32)
Calcium: 9.9 mg/dL (ref 8.9–10.3)
Creatinine, Ser: 0.76 mg/dL (ref 0.44–1.00)
GFR calc Af Amer: >60 mL/min (ref >60)
GFR calc non Af Amer: >60 mL/min (ref >60)
GLUCOSE: 116 mg/dL — AB (ref 70–99)
POTASSIUM: 3 mmol/L — AB (ref 3.5–5.1)
Sodium: 125 mmol/L — ABNORMAL LOW (ref 135–145)

## 2017-11-06 LAB — TROPONIN I: TROPONIN I: 0.39 ng/mL — AB (ref <0.03)

## 2017-11-06 LAB — MAGNESIUM: MAGNESIUM: 1.7 mg/dL (ref 1.7–2.4)

## 2017-11-06 MED ORDER — SODIUM CHLORIDE 0.9 % IV BOLUS
1000.0000 mL | Freq: Once | INTRAVENOUS | Status: AC
  Administered 2017-11-06: 1000 mL via INTRAVENOUS

## 2017-11-06 MED ORDER — POTASSIUM CHLORIDE CRYS ER 20 MEQ PO TBCR
40.0000 meq | EXTENDED_RELEASE_TABLET | Freq: Once | ORAL | Status: AC
  Administered 2017-11-06: 40 meq via ORAL
  Filled 2017-11-06: qty 2

## 2017-11-06 MED ORDER — IOHEXOL 300 MG/ML  SOLN
75.0000 mL | Freq: Once | INTRAMUSCULAR | Status: AC | PRN
  Administered 2017-11-06: 75 mL via INTRAVENOUS

## 2017-11-06 MED ORDER — MAGNESIUM SULFATE 2 GM/50ML IV SOLN
2.0000 g | Freq: Once | INTRAVENOUS | Status: AC
  Administered 2017-11-06: 2 g via INTRAVENOUS
  Filled 2017-11-06: qty 50

## 2017-11-06 NOTE — ED Provider Notes (Signed)
[facility] EMERGENCY DEPARTMENT Provider Note   CSN: [id number] Arrival date & time: 11/06/17  2022     History   Chief Complaint Chief Complaint  Patient presents with  . Weakness  . Sore Throat    HPI Shirley Brown is a 77 y.o. female.  Patient states that she has been weak for over a week.  She also has had a sore throat some.  The history is provided by the patient. No language interpreter was used.  Weakness  Primary symptoms include no focal weakness. This is a chronic problem. The current episode started more than 1 week ago. The problem has not changed since onset.There was no focality noted. There has been no fever. Pertinent negatives include no shortness of breath, no chest pain and no headaches. There were no medications administered prior to arrival. Associated medical issues include trauma.  Sore Throat  Pertinent negatives include no chest pain, no abdominal pain, no headaches and no shortness of breath.    Past Medical History:  Diagnosis Date  . Allergy to meropenem   . Anemia   . Arthritis   . 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 Carotid artery occlusion 05/11/09   Bruit - Right  . Depression 2011  . Hyperlipidemia   . Hypertension     Patient Active Problem List   Diagnosis Date Noted  . Hyponatremia 11/06/2017  . Osteopenia 03/19/2014  . Vitamin D deficiency 10/23/2013  . GERD (gastroesophageal reflux disease) 07/22/2013  . Generalized anxiety disorder 07/22/2013  . Cancer ([place])   . HLD (hyperlipidemia) 04/24/2012  . Arthritis 04/24/2012  . HTN (hypertension) 04/24/2012  . Hyperglycemia 04/24/2012  . Insomnia 04/24/2012  . Occlusion and stenosis of carotid artery without mention of cerebral infarction 03/06/2012    Past Surgical History:  Procedure Laterality Date  . APPENDECTOMY    . CHOLECYSTECTOMY    . JOINT REPLACEMENT Right 1995     Knee  . JOINT REPLACEMENT Left 2001   knee  . THROAT SURGERY Left    tonsil cancer-Left     6 1/2 weeks Radiation     OB History   None      Home Medications    Prior to Admission medications   Medication Sig Start Date End Date Taking? Authorizing Provider  acyclovir (ZOVIRAX) 400 MG tablet TAKE 1 TABLET BY MOUTH ONCE DAILY Patient taking differently: Take 400 mg by mouth daily.  07/25/17  Yes Stacks, Cletus Gash, MD  amLODipine (NORVASC) 2.5 MG tablet Take 1 tablet (2.5 mg total) by mouth daily. 10/31/17  Yes Claretta Fraise, MD  aspirin 81 MG tablet Take 81 mg by mouth daily.   Yes [provider]  Cholecalciferol (VITAMIN D3) 3000 UNITS TABS Take 1 tablet by mouth daily.    Yes [provider]  clotrimazole (MYCELEX) 10 MG troche Allow one to dissolve in the mouth 5 times daily For yeast Patient taking differently: Take 10 mg by mouth 5 (five) times daily. For yeast 11/01/17  Yes Stacks, Cletus Gash, MD  Cyanocobalamin (VITAMIN B 12 PO) Take 1 tablet by mouth daily.    Yes [provider]  DULoxetine (CYMBALTA) 30 MG capsule Take 1 capsule (30 mg total) by mouth daily. 10/31/17  Yes Stacks, Cletus Gash, MD  folic acid (FOLVITE) 1 MG tablet TAKE 1 TABLET BY MOUTH ONCE DAILY Patient taking differently: Take 1 mg by mouth daily.  11/01/17  Yes Stacks, Cletus Gash, MD  hydrOXYzine (VISTARIL) 25 MG capsule TAKE 1  CAPSULE (25 MG TOTAL) BY MOUTH 2 (TWO) TIMES DAILY AS NEEDED. Patient taking differently: Take 25 mg by mouth 2 (two) times daily as needed for anxiety.  11/02/17  Yes Stacks, Cletus Gash, MD  linaclotide Rolan Lipa) 145 MCG CAPS capsule Take 1 capsule (145 mcg total) by mouth daily. To regulate bowel movements 10/20/17  Yes Stacks, Cletus Gash, MD  pantoprazole (PROTONIX) 40 MG tablet TAKE 1 TABLET BY MOUTH TWICE A DAY Patient taking differently: Take 40 mg by mouth 2 (two) times daily.  09/28/17  Yes Stacks, Cletus Gash, MD  zolpidem (AMBIEN) 10 MG tablet TAKE 1 TABLET BY MOUTH EVERY DAY AT BEDTIME AS NEEDED Patient taking differently: Take 10 mg by mouth at bedtime.  10/09/17  Yes  Claretta Fraise, MD    Family History Family History  Problem Relation Age of Onset  . Heart disease Father   . Heart attack Father   . Hyperlipidemia Father   . Hypertension Father   . Cancer Brother        Throat and Lung  . Diabetes Son   . Hyperlipidemia Son   . Heart disease Sister        before age 57  . Hyperlipidemia Sister   . Hypertension Sister   . Other Sister        varicose veins    Social History Social History   Tobacco Use  . Smoking status: Former Smoker    Types: Cigarettes    Last attempt to quit: 01/24/1993    Years since quitting: 24.8  . Smokeless tobacco: Never Used  Substance Use Topics  . Alcohol use: No  . Drug use: No     Allergies   Bupropion; Sulfa antibiotics; Benadryl [diphenhydramine hcl (sleep)]; Doxycycline; Gabapentin; and Desyrel [trazodone]   Review of Systems Review of Systems  Constitutional: Negative for appetite change and fatigue.  HENT: Negative for congestion, ear discharge and sinus pressure.   Eyes: Negative for discharge.  Respiratory: Negative for cough and shortness of breath.   Cardiovascular: Negative for chest pain.  Gastrointestinal: Negative for abdominal pain and diarrhea.  Genitourinary: Negative for frequency and hematuria.  Musculoskeletal: Negative for back pain.  Skin: Negative for rash.  Neurological: Positive for weakness. Negative for focal weakness, seizures and headaches.  Psychiatric/Behavioral: Negative for hallucinations.     Physical Exam Updated Vital Signs BP (!) 151/85   Pulse (!) 103   Temp 97.9 F (36.6 C) (Oral)   Resp 15   SpO2 99%   Physical Exam  Constitutional: She is oriented to person, place, and time. She appears well-developed.  HENT:  Head: Normocephalic.  Eyes: Conjunctivae and EOM are normal. No scleral icterus.  Neck: Neck supple. No thyromegaly present.  Cardiovascular: Normal rate and regular rhythm. Exam reveals no gallop and no friction rub.  No murmur  heard. Pulmonary/Chest: No stridor. She has no wheezes. She has no rales. She exhibits no tenderness.  Abdominal: She exhibits no distension. There is no tenderness. There is no rebound.  Musculoskeletal: Normal range of motion. She exhibits no edema.  Lymphadenopathy:    She has no cervical adenopathy.  Neurological: She is oriented to person, place, and time. She exhibits normal muscle tone. Coordination normal.  Skin: No rash noted. No erythema.  Psychiatric: She has a normal mood and affect. Her behavior is normal.     ED Treatments / Results  Labs (all labs ordered are listed, but only abnormal results are displayed) Labs Reviewed  BASIC METABOLIC PANEL - Abnormal;  Notable for the following components:      Result Value   Sodium 125 (*)    Potassium 3.0 (*)    Chloride 93 (*)    Glucose, Bld 116 (*)    All other components within normal limits  URINALYSIS, ROUTINE W REFLEX MICROSCOPIC - Abnormal; Notable for the following components:   Color, Urine STRAW (*)    All other components within normal limits  CBC WITH DIFFERENTIAL/PLATELET - Abnormal; Notable for the following components:   RBC 2.89 (*)    Hemoglobin 9.4 (*)    HCT 26.5 (*)    RDW 16.1 (*)    All other components within normal limits  HEPATIC FUNCTION PANEL - Abnormal; Notable for the following components:   AST 61 (*)    Bilirubin, Direct 0.3 (*)    All other components within normal limits  TROPONIN I - Abnormal; Notable for the following components:   Troponin I 0.39 (*)    All other components within normal limits  MAGNESIUM  PHOSPHORUS  NA AND K (SODIUM & POTASSIUM), RAND UR  OSMOLALITY, URINE  OSMOLALITY  BRAIN NATRIURETIC PEPTIDE    EKG EKG Interpretation  Date/Time:  Monday November 06 2017 20:39:08 EDT Ventricular Rate:  88 PR Interval:    QRS Duration: 116 QT Interval:  383 QTC Calculation: 464 R Axis:   -69 Text Interpretation:  Sinus rhythm Borderline prolonged PR interval LAD,  consider left anterior fascicular block Confirmed by Milton Ferguson [id number]) on 11/06/2017 10:19:39 PM   Radiology Ct Soft Tissue Neck W Contrast  Result Date: 11/06/2017 CLINICAL DATA:  Initial evaluation for acute sore throat for 6 weeks. History of throat cancer. Pulsatile mass. EXAM: CT NECK WITH CONTRAST TECHNIQUE: Multidetector CT imaging of the neck was performed using the standard protocol following the bolus administration of intravenous contrast. CONTRAST:  81mL OMNIPAQUE IOHEXOL 300 MG/ML  SOLN COMPARISON:  None available. FINDINGS: Pharynx and larynx: Oral cavity within normal limits without discrete mass or loculated collection. No acute inflammatory changes about the dentition. Discernible or discrete base of tongue mass. [place] tonsils appear to be absent. There is mild mucosal edema involving the oropharyngeal mucosa, more prominent on the left (series 2, image 34), which could reflect acute pharyngitis. Parapharyngeal fat relatively well maintained. Nasopharynx within normal limits. No retropharyngeal collection or effusion. Epiglottis within normal limits. Vallecula partially effaced and not well evaluated. No other discrete mass or significant inflammatory changes. True cords symmetric and within normal limits. Subglottic airway clear. Salivary glands: Chronic fatty atrophy noted within the parotid and submandibular glands bilaterally. No acute inflammatory changes or stones identified. 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 Right thyroid lobe is normal in appearance. Lymph nodes: No pathologically enlarged lymph nodes identified within the neck. Vascular: Moderate to advanced atherosclerotic change about the aortic arch and origin of the great vessels as well as the carotid bifurcations bilaterally. Right common and internal carotid arteries are medialized into the retropharyngeal space. Tortuosity of the distal left ICA closely approximates the left nasopharynx. Jugular veins  patent bilaterally. Limited intracranial: Scattered vascular calcifications noted within the carotid siphons. Otherwise unremarkable. Visualized orbits: Patient status post bilateral ocular lens replacement. Globes and orbital soft tissues otherwise unremarkable. Mastoids and visualized paranasal sinuses: Few scattered maxillary retention cyst noted bilaterally. Visualized paranasal sinuses otherwise clear. Visualize mastoids and middle ear cavities are well pneumatized and free of fluid. Skeleton: No acute osseous abnormality. No worrisome lytic or blastic osseous lesions. 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 Chronic facet mediated grade 1 anterolisthesis of C7 on T1. Upper chest: Gaseous distension of the visualized upper esophagus noted. Partially visualized upper chest demonstrates no other acute abnormality. Partially visualized lungs are clear. Other: None. IMPRESSION: 1. Mild mucosal edema involving the oropharyngeal mucosa, primarily on the left. Finding could reflect sequelae of mild pharyngitis. Correlation with physical exam recommended. 2. No other acute inflammatory changes identified within the neck. No discrete mass or adenopathy. 3. Medialization of the right carotid arteries into the retropharyngeal space. Query this as pulsatile lesion seen on physical exam. 4. Advanced atherosclerosis. Electronically Signed   By: Jeannine Boga M.D.   On: 11/06/2017 22:19    Procedures Procedures (including critical care time)  Medications Ordered in ED Medications  magnesium sulfate IVPB 2 g 50 mL (2 g Intravenous New Bag/Given 11/06/17 2308)  sodium chloride 0.9 % bolus 1,000 mL (0 mLs Intravenous Stopped 11/06/17 2226)  iohexol (OMNIPAQUE) 300 MG/ML solution 75 mL (75 mLs Intravenous Contrast Given 11/06/17 2148)  potassium chloride SA (K-DUR,KLOR-CON) CR tablet 40 mEq (40 mEq Oral Given 11/06/17 2251)     Initial Impression / Assessment and Plan / ED Course  I have reviewed  the triage vital signs and the nursing notes.  Pertinent labs & imaging results that were available during my care of the patient were reviewed by me and considered in my medical decision making (see chart for details).     CRITICAL CARE Performed by: Milton Ferguson Total critical care time: 45 minutes Critical care time was exclusive of separately billable procedures and treating other patients. Critical care was necessary to treat or prevent imminent or life-threatening deterioration. Critical care was time spent personally by me on the following activities: development of treatment plan with patient and/or surrogate as well as nursing, discussions with consultants, evaluation of patient's response to treatment, examination of patient, obtaining history from patient or surrogate, ordering and performing treatments and interventions, ordering and review of laboratory studies, ordering and review of radiographic studies, pulse oximetry and re-evaluation of patient's condition. Patient with an end STEMI and hyponatremia she will be admitted to medicine with cardiology consult.  Cardiology at [facility] was consulted over the phone and it was felt the patient could be admitted at any [facility]  Final Clinical Impressions(s) / ED Diagnoses   Final diagnoses:  NSTEMI (non-ST elevated myocardial infarction) [facility])    ED Discharge Orders    None       Milton Ferguson, MD 11/06/17 2315

## 2017-11-06 NOTE — ED Notes (Signed)
Date and time results received: 11/06/17 @22 :19  Test: trop 0.39 Critical Value: trop 0.39  Name of Provider Notified: Dr Roderic Palau  Orders Received? Or Actions Taken?: no additional orders given,

## 2017-11-06 NOTE — ED Notes (Signed)
Patient transported to CT 

## 2017-11-06 NOTE — ED Notes (Signed)
Dr Olevia Bowens speaking with pt and family.

## 2017-11-06 NOTE — ED Triage Notes (Signed)
Sore thraot x 6 weeks. Seen pcp and dx with thrush, then stated it wasn't. Pt has hx of throat cancer. Pt c/o gen weakness x 3 weeks. A/o to most. gen weakness noted. Color wnl.

## 2017-11-07 ENCOUNTER — Inpatient Hospital Stay (HOSPITAL_COMMUNITY): Payer: Medicare Other

## 2017-11-07 ENCOUNTER — Encounter (HOSPITAL_COMMUNITY): Payer: Self-pay | Admitting: Internal Medicine

## 2017-11-07 ENCOUNTER — Other Ambulatory Visit: Payer: Self-pay

## 2017-11-07 DIAGNOSIS — E876 Hypokalemia: Secondary | ICD-10-CM

## 2017-11-07 DIAGNOSIS — R1310 Dysphagia, unspecified: Secondary | ICD-10-CM

## 2017-11-07 DIAGNOSIS — E871 Hypo-osmolality and hyponatremia: Principal | ICD-10-CM

## 2017-11-07 DIAGNOSIS — I340 Nonrheumatic mitral (valve) insufficiency: Secondary | ICD-10-CM

## 2017-11-07 DIAGNOSIS — I214 Non-ST elevation (NSTEMI) myocardial infarction: Secondary | ICD-10-CM

## 2017-11-07 DIAGNOSIS — R740 Nonspecific elevation of levels of transaminase and lactic acid dehydrogenase [LDH]: Secondary | ICD-10-CM

## 2017-11-07 DIAGNOSIS — R7989 Other specified abnormal findings of blood chemistry: Secondary | ICD-10-CM

## 2017-11-07 DIAGNOSIS — R634 Abnormal weight loss: Secondary | ICD-10-CM

## 2017-11-07 DIAGNOSIS — I10 Essential (primary) hypertension: Secondary | ICD-10-CM

## 2017-11-07 DIAGNOSIS — L989 Disorder of the skin and subcutaneous tissue, unspecified: Secondary | ICD-10-CM

## 2017-11-07 DIAGNOSIS — E782 Mixed hyperlipidemia: Secondary | ICD-10-CM

## 2017-11-07 DIAGNOSIS — D649 Anemia, unspecified: Secondary | ICD-10-CM

## 2017-11-07 LAB — TROPONIN I
TROPONIN I: 0.48 ng/mL — AB (ref <0.03)
TROPONIN I: 0.53 ng/mL — AB (ref <0.03)
Troponin I: 0.57 ng/mL (ref <0.03)

## 2017-11-07 LAB — BASIC METABOLIC PANEL
ANION GAP: 6 (ref 5–15)
BUN: 7 mg/dL — ABNORMAL LOW (ref 8–23)
CHLORIDE: 103 mmol/L (ref 98–111)
CO2: 25 mmol/L (ref 22–32)
Calcium: 8.9 mg/dL (ref 8.9–10.3)
Creatinine, Ser: 0.7 mg/dL (ref 0.44–1.00)
GFR calc non Af Amer: >60 mL/min (ref >60)
Glucose, Bld: 110 mg/dL — ABNORMAL HIGH (ref 70–99)
Potassium: 3.4 mmol/L — ABNORMAL LOW (ref 3.5–5.1)
Sodium: 134 mmol/L — ABNORMAL LOW (ref 135–145)

## 2017-11-07 LAB — CBC
HCT: 24.2 % — ABNORMAL LOW (ref 36.0–46.0)
Hemoglobin: 8.3 g/dL — ABNORMAL LOW (ref 12.0–15.0)
MCH: 31.6 pg (ref 26.0–34.0)
MCHC: 34.3 g/dL (ref 30.0–36.0)
MCV: 92 fL (ref 80.0–100.0)
NRBC: 0 % (ref 0.0–0.2)
PLATELETS: 297 10*3/uL (ref 150–400)
RBC: 2.63 MIL/uL — AB (ref 3.87–5.11)
RDW: 16.3 % — ABNORMAL HIGH (ref 11.5–15.5)
WBC: 4 10*3/uL (ref 4.0–10.5)

## 2017-11-07 LAB — ECHOCARDIOGRAM COMPLETE
HEIGHTINCHES: 63 in
WEIGHTICAEL: 2430.35 [oz_av]

## 2017-11-07 LAB — SODIUM: SODIUM: 130 mmol/L — AB (ref 135–145)

## 2017-11-07 LAB — PROTIME-INR
INR: 1.3
Prothrombin Time: 16 seconds — ABNORMAL HIGH (ref 11.4–15.2)

## 2017-11-07 LAB — OSMOLALITY: Osmolality: 273 mOsm/kg — ABNORMAL LOW (ref 275–295)

## 2017-11-07 LAB — MRSA PCR SCREENING: MRSA by PCR: NEGATIVE

## 2017-11-07 LAB — APTT

## 2017-11-07 LAB — HEPARIN LEVEL (UNFRACTIONATED): Heparin Unfractionated: 0.26 IU/mL — ABNORMAL LOW (ref 0.30–0.70)

## 2017-11-07 LAB — NA AND K (SODIUM & POTASSIUM), RAND UR
Potassium Urine: 8 mmol/L
Sodium, Ur: 48 mmol/L

## 2017-11-07 LAB — BRAIN NATRIURETIC PEPTIDE: B Natriuretic Peptide: 191 pg/mL — ABNORMAL HIGH (ref 0.0–100.0)

## 2017-11-07 LAB — OSMOLALITY, URINE: Osmolality, Ur: 151 mOsm/kg — ABNORMAL LOW (ref 300–900)

## 2017-11-07 MED ORDER — POTASSIUM CHLORIDE IN NACL 40-0.9 MEQ/L-% IV SOLN
INTRAVENOUS | Status: DC
  Administered 2017-11-07: 50 mL/h via INTRAVENOUS

## 2017-11-07 MED ORDER — HEPARIN BOLUS VIA INFUSION
1000.0000 [IU] | Freq: Once | INTRAVENOUS | Status: AC
  Administered 2017-11-07: 1000 [IU] via INTRAVENOUS
  Filled 2017-11-07: qty 1000

## 2017-11-07 MED ORDER — HEPARIN BOLUS VIA INFUSION
3500.0000 [IU] | Freq: Once | INTRAVENOUS | Status: AC
  Administered 2017-11-07: 3500 [IU] via INTRAVENOUS
  Filled 2017-11-07: qty 3500

## 2017-11-07 MED ORDER — AMLODIPINE BESYLATE 5 MG PO TABS
2.5000 mg | ORAL_TABLET | Freq: Every day | ORAL | Status: DC
  Administered 2017-11-07 – 2017-11-09 (×3): 2.5 mg via ORAL
  Filled 2017-11-07 (×3): qty 1

## 2017-11-07 MED ORDER — ORAL CARE MOUTH RINSE
15.0000 mL | Freq: Two times a day (BID) | OROMUCOSAL | Status: DC
  Administered 2017-11-07 – 2017-11-09 (×5): 15 mL via OROMUCOSAL

## 2017-11-07 MED ORDER — HYDROCODONE-ACETAMINOPHEN 5-325 MG PO TABS
1.0000 | ORAL_TABLET | Freq: Four times a day (QID) | ORAL | Status: DC | PRN
  Administered 2017-11-07 – 2017-11-09 (×4): 1 via ORAL
  Filled 2017-11-07 (×6): qty 1

## 2017-11-07 MED ORDER — ACYCLOVIR 800 MG PO TABS: 400.0000 mg | ORAL_TABLET | Freq: Every day | ORAL | Status: DC

## 2017-11-07 MED ORDER — HYDROXYZINE PAMOATE 25 MG PO CAPS
25.0000 mg | ORAL_CAPSULE | Freq: Two times a day (BID) | ORAL | Status: DC | PRN
  Filled 2017-11-07: qty 1

## 2017-11-07 MED ORDER — LIDOCAINE VISCOUS HCL 2 % MT SOLN
15.0000 mL | Freq: Three times a day (TID) | OROMUCOSAL | Status: DC
  Administered 2017-11-07 – 2017-11-09 (×6): 15 mL via OROMUCOSAL
  Filled 2017-11-07 (×6): qty 15

## 2017-11-07 MED ORDER — K PHOS MONO-SOD PHOS DI & MONO 155-852-130 MG PO TABS
500.0000 mg | ORAL_TABLET | Freq: Three times a day (TID) | ORAL | Status: DC
  Administered 2017-11-07 – 2017-11-09 (×9): 500 mg via ORAL
  Filled 2017-11-07 (×9): qty 2

## 2017-11-07 MED ORDER — ASPIRIN EC 81 MG PO TBEC
81.0000 mg | DELAYED_RELEASE_TABLET | Freq: Every day | ORAL | Status: DC
  Administered 2017-11-07 – 2017-11-09 (×3): 81 mg via ORAL
  Filled 2017-11-07 (×3): qty 1

## 2017-11-07 MED ORDER — ACETAMINOPHEN 650 MG RE SUPP: 650.0000 mg | Freq: Four times a day (QID) | RECTAL | Status: DC | PRN

## 2017-11-07 MED ORDER — PANTOPRAZOLE SODIUM 40 MG PO TBEC
40.0000 mg | DELAYED_RELEASE_TABLET | Freq: Two times a day (BID) | ORAL | Status: DC
  Administered 2017-11-07 – 2017-11-09 (×6): 40 mg via ORAL
  Filled 2017-11-07 (×6): qty 1

## 2017-11-07 MED ORDER — FOLIC ACID 1 MG PO TABS
1.0000 mg | ORAL_TABLET | Freq: Every day | ORAL | Status: DC
  Administered 2017-11-07 – 2017-11-09 (×3): 1 mg via ORAL
  Filled 2017-11-07 (×3): qty 1

## 2017-11-07 MED ORDER — HYDROXYZINE HCL 25 MG PO TABS
25.0000 mg | ORAL_TABLET | Freq: Two times a day (BID) | ORAL | Status: DC | PRN
  Administered 2017-11-08: 25 mg via ORAL
  Filled 2017-11-07 (×2): qty 1

## 2017-11-07 MED ORDER — MAGIC MOUTHWASH
15.0000 mL | Freq: Three times a day (TID) | ORAL | Status: DC
  Administered 2017-11-07 – 2017-11-09 (×5): 15 mL via ORAL
  Filled 2017-11-07 (×5): qty 15

## 2017-11-07 MED ORDER — ONDANSETRON HCL 4 MG/2ML IJ SOLN
4.0000 mg | Freq: Four times a day (QID) | INTRAMUSCULAR | Status: DC | PRN
  Administered 2017-11-07: 4 mg via INTRAVENOUS
  Filled 2017-11-07: qty 2

## 2017-11-07 MED ORDER — MAGIC MOUTHWASH W/LIDOCAINE: 15.0000 mL | Freq: Three times a day (TID) | ORAL | Status: DC

## 2017-11-07 MED ORDER — ONDANSETRON HCL 4 MG PO TABS: 4.0000 mg | ORAL_TABLET | Freq: Four times a day (QID) | ORAL | Status: DC | PRN

## 2017-11-07 MED ORDER — ENOXAPARIN SODIUM 40 MG/0.4ML ~~LOC~~ SOLN: 40.0000 mg | SUBCUTANEOUS | Status: DC

## 2017-11-07 MED ORDER — DULOXETINE HCL 30 MG PO CPEP
30.0000 mg | ORAL_CAPSULE | Freq: Every day | ORAL | Status: DC
  Administered 2017-11-07 – 2017-11-09 (×3): 30 mg via ORAL
  Filled 2017-11-07 (×3): qty 1

## 2017-11-07 MED ORDER — POTASSIUM PHOSPHATE MONOBASIC 500 MG PO TABS: 500.0000 mg | ORAL_TABLET | Freq: Three times a day (TID) | ORAL | Status: DC

## 2017-11-07 MED ORDER — HEPARIN (PORCINE) IN NACL 100-0.45 UNIT/ML-% IJ SOLN
900.0000 [IU]/h | INTRAMUSCULAR | Status: DC
  Administered 2017-11-07: 750 [IU]/h via INTRAVENOUS
  Administered 2017-11-08 – 2017-11-09 (×2): 900 [IU]/h via INTRAVENOUS
  Filled 2017-11-07 (×3): qty 250

## 2017-11-07 MED ORDER — ALUM & MAG HYDROXIDE-SIMETH 200-200-20 MG/5ML PO SUSP
30.0000 mL | ORAL | Status: DC | PRN
  Administered 2017-11-07 – 2017-11-08 (×4): 30 mL via ORAL
  Filled 2017-11-07 (×4): qty 30

## 2017-11-07 MED ORDER — ZOLPIDEM TARTRATE 5 MG PO TABS
5.0000 mg | ORAL_TABLET | Freq: Every day | ORAL | Status: DC
  Administered 2017-11-07 – 2017-11-08 (×3): 5 mg via ORAL
  Filled 2017-11-07 (×3): qty 1

## 2017-11-07 MED ORDER — SODIUM CHLORIDE 3 % IV SOLN
INTRAVENOUS | Status: DC
  Administered 2017-11-07: 25 mL/h via INTRAVENOUS
  Filled 2017-11-07 (×2): qty 500

## 2017-11-07 MED ORDER — ACETAMINOPHEN 325 MG PO TABS
650.0000 mg | ORAL_TABLET | Freq: Four times a day (QID) | ORAL | Status: DC | PRN
  Administered 2017-11-07 – 2017-11-08 (×2): 650 mg via ORAL
  Filled 2017-11-07 (×2): qty 2

## 2017-11-07 MED ORDER — LINACLOTIDE 145 MCG PO CAPS: 145.0000 ug | ORAL_CAPSULE | Freq: Every day | ORAL | Status: DC

## 2017-11-07 MED ORDER — CLOTRIMAZOLE 10 MG MT TROC
10.0000 mg | Freq: Every day | OROMUCOSAL | Status: DC
  Administered 2017-11-07 – 2017-11-09 (×13): 10 mg via ORAL
  Filled 2017-11-07 (×20): qty 1

## 2017-11-07 NOTE — Progress Notes (Signed)
ANTICOAGULATION CONSULT NOTE - Preliminary  Pharmacy Consult for Heparin Indication: ACS/stemi  Allergies  Allergen Reactions  . Bupropion Other (See Comments)    Lips swelled  . Sulfa Antibiotics Itching and Rash  . Benadryl [Diphenhydramine Hcl (Sleep)] Other (See Comments)    Insomnia   . Doxycycline Nausea And Vomiting    Dizziness   . Gabapentin Other (See Comments)    Drunk feeling, staggering  . Desyrel [Trazodone] Other (See Comments)    Keeps me up all night    Patient Measurements: Height: 5\' 3"  (160 cm) Weight: 151 lb 14.4 oz (68.9 kg) IBW/kg (Calculated) : 52.4 HEPARIN DW (KG): 66.5   Vital Signs: Temp: 98.2 F (36.8 C) (10/15 0400) Temp Source: Oral (10/15 0400) BP: 128/58 (10/15 0500) Pulse Rate: 80 (10/15 0500)  Labs: Recent Labs    11/06/17 2038 11/06/17 2103 11/07/17 0245  HGB  --  9.4*  --   HCT  --  26.5*  --   PLT  --  290  --   CREATININE  --  0.76  --   TROPONINI 0.39*  --  0.57*   Estimated Creatinine Clearance: 55.7 mL/min (by C-G formula based on SCr of 0.76 mg/dL).  Medical History: Past Medical History:  Diagnosis Date  . Allergy to meropenem   . Anemia   . Arthritis   . 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 Carotid artery occlusion 05/11/09   Bruit - Right  . Depression 2011  . Hyperlipidemia   . Hypertension     Medications:   Assessment: 77 yo female admitted to the ICU for management of hyponatremia and elevated troponin. Pt complains of increasing weakness, loss of appetite x several weeks and sore throat. Pt without complaints of chest pain or SOB. Cardiology has been consulted and recommends serial EKGs and troponins. Pharmacy has been consulted for IV heparin dosing.  Goal of Therapy:  Heparin level goal: 0.3-0.7 units/ml Monitor platelets by anticoagulation protocol: Yes   Plan:  Heparin 3500 unit IV bolus  Heparin infusion at 750 units/hr Heparin level in 8 hours  Preliminary review of pertinent patient  information completed.  Forestine Na clinical pharmacist will complete review during morning rounds to assess the patient and finalize treatment regimen.  Norberto Sorenson, [facility] 11/07/2017,5:43 AM

## 2017-11-07 NOTE — Progress Notes (Signed)
ANTICOAGULATION CONSULT NOTE -  Pharmacy Consult for Heparin Indication: ACS/stemi  Allergies  Allergen Reactions  . Bupropion Other (See Comments)    Lips swelled  . Sulfa Antibiotics Itching and Rash  . Benadryl [Diphenhydramine Hcl (Sleep)] Other (See Comments)    Insomnia   . Doxycycline Nausea And Vomiting    Dizziness   . Gabapentin Other (See Comments)    Drunk feeling, staggering  . Desyrel [Trazodone] Other (See Comments)    Keeps me up all night    Patient Measurements: Height: 5\' 3"  (160 cm) Weight: 151 lb 14.4 oz (68.9 kg) IBW/kg (Calculated) : 52.4 HEPARIN DW (KG): 66.5   Vital Signs: Temp: 99 F (37.2 C) (10/15 1148) Temp Source: Oral (10/15 1148) BP: 130/65 (10/15 1400) Pulse Rate: 99 (10/15 1400)  Labs: Recent Labs    11/06/17 2103 11/07/17 0245 11/07/17 0728 11/07/17 1412  HGB 9.4*  --   --  8.3*  HCT 26.5*  --   --  24.2*  PLT 290  --   --  297  APTT  --   --  >200*  --   LABPROT  --   --  16.0*  --   INR  --   --  1.30  --   HEPARINUNFRC  --   --   --  0.26*  CREATININE 0.76  --   --  0.70  TROPONINI  --  0.57* 0.48* 0.53*   Estimated Creatinine Clearance: 55.7 mL/min (by C-G formula based on SCr of 0.7 mg/dL).  Medical History: Past Medical History:  Diagnosis Date  . Allergy to meropenem   . Anemia   . Arthritis   . 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 Carotid artery occlusion 05/11/09   Bruit - Right  . Depression 2011  . Hyperlipidemia   . Hypertension     Medications:   Assessment: 77 yo female admitted to the ICU for management of hyponatremia and elevated troponin. Pt complains of increasing weakness, loss of appetite x several weeks and sore throat. Pt without complaints of chest pain or SOB. Cardiology has been consulted and recommends serial EKGs and troponins. Pharmacy has been consulted for IV heparin dosing.  Goal of Therapy:  Heparin level goal: 0.3-0.7 units/ml Monitor platelets by anticoagulation protocol:  Yes   Plan:  Heparin 1000 unit IV bolus  Heparin infusion at 900 units/hr Heparin level in 8 hours  Margot Ables, PharmD Clinical Pharmacist 11/07/2017 3:45 PM

## 2017-11-07 NOTE — Progress Notes (Signed)
*  PRELIMINARY RESULTS* Echocardiogram 2D Echocardiogram has been performed.  Leavy Cella 11/07/2017, 11:36 AM

## 2017-11-07 NOTE — Consult Note (Addendum)
Cardiology Consult    Patient ID: Shirley Brown [place]; [id number]; 01/30/1940   Admit date: 11/06/2017 Date of Consult: 11/07/2017  Primary Care Provider: Claretta Fraise, MD Primary Cardiologist: New to [facility] - Dr. Bronson Ing  Patient Profile    Shirley Brown is a 77 y.o. female with past medical history of HTN, HLD, carotid artery stenosis, chronic anemia, and history of throat cancer who is being seen today for the evaluation of NSTEMI at the request of Dr. Livia Snellen.   History of Present Illness    Shirley Brown was last evaluated by her PCP on 10/31/2017 and reported worsening fatigue and weakness over the past 2 to 3 weeks. She noted a decreased appetite and was having episodes of dysphagia and had lost over 10 pounds within the past few weeks.  Several of her medications were discontinued at the time of her visit including Pravastatin, Fluconazole, Fenofibrate, and Lisinopril-HCTZ. Amlodipine and Duloxetine doses were reduced.   She presented to [facility] ED on 11/06/2017 for worsening weakness. In talking with the patient, she reports progressive weakness over the past few months but has noticed an acute worsening of this over the past 3 to 4 weeks. She reports consuming little food and liquids at home due to not having an appetite and has lost over 20 pounds during this timeframe. She denies any specific chest discomfort or palpitations. Does report having baseline dyspnea on exertion. No recent orthopnea, PND, or lower extremity edema.  She lives at home by herself but reports her daughter visits frequently. The patient denies any known history of CAD or CHF but reports both parents had CAD. Has known HTN and HLD but denies any history of Type 2 DM. Is a former smoker but quit in the mid-1990's.  Initial labs showed WBC 4.1, Hgb 9.4, platelets 290, Na+ 125, K+ 3.0, and creatinine 0.76.  AST 61 and ALT 25.  UA unrevealing.  BNP 191. Urine sodium 48. Initial troponin 0.39 with repeat  value at 0.57. CT Neck showing mucosal edema involving the oropharyngeal mucosa which could correlate with mild pharyngitis and no other acute findings noted. EKG shows NSR, HR 88, with 1st degree AV Block, LAD, and LAFB. No prior tracings available for comparison.    Past Medical History:  Diagnosis Date  . Allergy to meropenem   . Anemia   . Arthritis   . Cancer ([place])    left tonsillar  . Carotid artery occlusion 05/11/09   Bruit - Right  . Depression 2011  . Hyperlipidemia   . Hypertension     Past Surgical History:  Procedure Laterality Date  . APPENDECTOMY    . CHOLECYSTECTOMY    . JOINT REPLACEMENT Right 1995     Knee  . JOINT REPLACEMENT Left 2001   knee  . THROAT SURGERY Left    tonsil cancer-Left    6 1/2 weeks Radiation     Home Medications:  Prior to Admission medications   Medication Sig Start Date End Date Taking? Authorizing Provider  acyclovir (ZOVIRAX) 400 MG tablet TAKE 1 TABLET BY MOUTH ONCE DAILY Patient taking differently: Take 400 mg by mouth daily.  07/25/17  Yes Stacks, Cletus Gash, MD  amLODipine (NORVASC) 2.5 MG tablet Take 1 tablet (2.5 mg total) by mouth daily. 10/31/17  Yes Claretta Fraise, MD  aspirin 81 MG tablet Take 81 mg by mouth daily.   Yes [provider]  Cholecalciferol (VITAMIN D3) 3000 UNITS TABS Take 1 tablet by mouth daily.  Yes [provider]  clotrimazole (MYCELEX) 10 MG troche Allow one to dissolve in the mouth 5 times daily For yeast Patient taking differently: Take 10 mg by mouth 5 (five) times daily. For yeast 11/01/17  Yes Stacks, Cletus Gash, MD  Cyanocobalamin (VITAMIN B 12 PO) Take 1 tablet by mouth daily.    Yes [provider]  DULoxetine (CYMBALTA) 30 MG capsule Take 1 capsule (30 mg total) by mouth daily. 10/31/17  Yes Stacks, Cletus Gash, MD  folic acid (FOLVITE) 1 MG tablet TAKE 1 TABLET BY MOUTH ONCE DAILY Patient taking differently: Take 1 mg by mouth daily.  11/01/17  Yes Stacks, Cletus Gash, MD  hydrOXYzine  (VISTARIL) 25 MG capsule TAKE 1 CAPSULE (25 MG TOTAL) BY MOUTH 2 (TWO) TIMES DAILY AS NEEDED. Patient taking differently: Take 25 mg by mouth 2 (two) times daily as needed for anxiety.  11/02/17  Yes Stacks, Cletus Gash, MD  linaclotide Rolan Lipa) 145 MCG CAPS capsule Take 1 capsule (145 mcg total) by mouth daily. To regulate bowel movements 10/20/17  Yes Stacks, Cletus Gash, MD  pantoprazole (PROTONIX) 40 MG tablet TAKE 1 TABLET BY MOUTH TWICE A DAY Patient taking differently: Take 40 mg by mouth 2 (two) times daily.  09/28/17  Yes Stacks, Cletus Gash, MD  zolpidem (AMBIEN) 10 MG tablet TAKE 1 TABLET BY MOUTH EVERY DAY AT BEDTIME AS NEEDED Patient taking differently: Take 10 mg by mouth at bedtime.  10/09/17  Yes Claretta Fraise, MD    Inpatient Medications: Scheduled Meds: . amLODipine  2.5 mg Oral Daily  . aspirin EC  81 mg Oral Daily  . clotrimazole  10 mg Oral 5 X Daily  . DULoxetine  30 mg Oral Daily  . folic acid  1 mg Oral Daily  . mouth rinse  15 mL Mouth Rinse BID  . pantoprazole  40 mg Oral BID  . phosphorus  500 mg Oral TID WC  . zolpidem  5 mg Oral QHS   Continuous Infusions: . 0.9 % NaCl with KCl 40 mEq / L 50 mL/hr at 11/07/17 8921  . heparin 750 Units/hr (11/07/17 1941)   PRN Meds: acetaminophen **OR** acetaminophen, hydrOXYzine, ondansetron **OR** ondansetron (ZOFRAN) IV  Allergies:    Allergies  Allergen Reactions  . Bupropion Other (See Comments)    Lips swelled  . Sulfa Antibiotics Itching and Rash  . Benadryl [Diphenhydramine Hcl (Sleep)] Other (See Comments)    Insomnia   . Doxycycline Nausea And Vomiting    Dizziness   . Gabapentin Other (See Comments)    Drunk feeling, staggering  . Desyrel [Trazodone] Other (See Comments)    Keeps me up all night    Social History:   Social History   Socioeconomic History  . Marital status: Divorced    Spouse name: Not on file  . Number of children: Not on file  . Years of education: Not on file  . Highest education level:  Not on file  Occupational History  . Not on file  Social Needs  . Financial resource strain: Not on file  . Food insecurity:    Worry: Not on file    Inability: Not on file  . Transportation needs:    Medical: Not on file    Non-medical: Not on file  Tobacco Use  . Smoking status: Former Smoker    Types: Cigarettes    Last attempt to quit: 01/24/1993    Years since quitting: 24.8  . Smokeless tobacco: Never Used  Substance and Sexual Activity  .  Alcohol use: No  . Drug use: No  . Sexual activity: Not on file  Lifestyle  . Physical activity:    Days per week: Not on file    Minutes per session: Not on file  . Stress: Not on file  Relationships  . Social connections:    Talks on phone: Not on file    Gets together: Not on file    Attends religious service: Not on file    Active member of club or organization: Not on file    Attends meetings of clubs or organizations: Not on file    Relationship status: Not on file  . Intimate partner violence:    Fear of current or ex partner: Not on file    Emotionally abused: Not on file    Physically abused: Not on file    Forced sexual activity: Not on file  Other Topics Concern  . Not on file  Social History Narrative  . Not on file     Family History:    Family History  Problem Relation Age of Onset  . Heart disease Father   . Heart attack Father   . Hyperlipidemia Father   . Hypertension Father   . Cancer Brother        Throat and Lung  . Diabetes Son   . Hyperlipidemia Son   . Heart disease Sister        before age 29  . Hyperlipidemia Sister   . Hypertension Sister   . Other Sister        varicose veins      Review of Systems    General:  No chills, fever, or night sweats. Positive for fatigue and weight loss. Cardiovascular:  No chest pain, dyspnea on exertion, edema, orthopnea, palpitations, paroxysmal nocturnal dyspnea. Dermatological: No rash, lesions/masses Respiratory: No cough, dyspnea Urologic: No  hematuria, dysuria Abdominal:   No nausea, vomiting, diarrhea, bright red blood per rectum, melena, or hematemesis Neurologic:  No visual changes or changes in mental status. Positive for generalized weakness.   All other systems reviewed and are otherwise negative except as noted above.  Physical Exam/Data    Vitals:   11/07/17 0300 11/07/17 0400 11/07/17 0500 11/07/17 0600  BP: (!) 135/55 (!) 136/55 (!) 128/58 (!) 133/59  Pulse: 81 83 80 82  Resp: 10 14 10 11   Temp:  98.2 F (36.8 C)    TempSrc:  Oral    SpO2: 99% 99% 100% 100%  Weight:   68.9 kg   Height:        Intake/Output Summary (Last 24 hours) at 11/07/2017 0748 Last data filed at 11/07/2017 0814 Gross per 24 hour  Intake 1528.92 ml  Output 1600 ml  Net -71.08 ml   Filed Weights   11/07/17 0029 11/07/17 0500  Weight: 68.9 kg 68.9 kg   Body mass index is 26.91 kg/m.   General: Pleasant, elderly Caucasian female appearing in NAD. Psych: Normal affect. Neuro: Alert and oriented X 3. Moves all extremities spontaneously. HEENT: Normal  Neck: Supple without bruits or JVD. Lungs:  Resp regular and unlabored, rhonchi throughout upper lung fields. Heart: RRR no s3, s4, 2/6 SEM along RUSB. Abdomen: Soft, non-tender, non-distended, BS + x 4.  Extremities: No clubbing or cyanosis. Trace lower extremity edema bilaterally. DP/PT/Radials 2+ and equal bilaterally.   EKG:  The EKG was personally reviewed and demonstrates: NSR, HR 88, with 1st degree AV Block, LAD, and LAFB. No prior tracings available for comparison.  Labs/Studies     Relevant CV Studies:  Carotid Dopplers: 03/2014   Laboratory Data:  Chemistry Recent Labs  Lab 10/31/17 1415 11/06/17 2103 11/07/17 0129  NA 126* 125* 130*  K 4.0 3.0*  --   CL 89* 93*  --   CO2 20 23  --   GLUCOSE 101* 116*  --   BUN 22 13  --   CREATININE 1.12* 0.76  --   CALCIUM 10.6* 9.9  --   GFRNONAA 48* >60  --   GFRAA 55* >60  --   ANIONGAP  --  9  --       Recent Labs  Lab 10/31/17 1415 11/06/17 2038  PROT 7.1 7.7  ALBUMIN 4.5 4.4  AST 62* 61*  ALT 20 25  ALKPHOS 49 42  BILITOT 0.9 1.1   Hematology Recent Labs  Lab 10/31/17 1415 11/06/17 2103  WBC 5.1 4.1  RBC 3.12* 2.89*  HGB 9.0* 9.4*  HCT 26.7* 26.5*  MCV 86 91.7  MCH 28.8 32.5  MCHC 33.7 35.5  RDW 13.7 16.1*  PLT 325 290   Cardiac Enzymes Recent Labs  Lab 11/06/17 2038 11/07/17 0245  TROPONINI 0.39* 0.57*   No results for input(s): TROPIPOC in the last 168 hours.  BNP Recent Labs  Lab 11/06/17 2103  BNP 191.0*    DDimer No results for input(s): DDIMER in the last 168 hours.  Radiology/Studies:  Ct Soft Tissue Neck W Contrast  Result Date: 11/06/2017 CLINICAL DATA:  Initial evaluation for acute sore throat for 6 weeks. History of throat cancer. Pulsatile mass. EXAM: CT NECK WITH CONTRAST TECHNIQUE: Multidetector CT imaging of the neck was performed using the standard protocol following the bolus administration of intravenous contrast. CONTRAST:  52mL OMNIPAQUE IOHEXOL 300 MG/ML  SOLN COMPARISON:  None available. FINDINGS: Pharynx and larynx: Oral cavity within normal limits without discrete mass or loculated collection. No acute inflammatory changes about the dentition. Discernible or discrete base of tongue mass. Palatine tonsils appear to be absent. There is mild mucosal edema involving the oropharyngeal mucosa, more prominent on the left (series 2, image 34), which could reflect acute pharyngitis. Parapharyngeal fat relatively well maintained. Nasopharynx within normal limits. No retropharyngeal collection or effusion. Epiglottis within normal limits. Vallecula partially effaced and not well evaluated. No other discrete mass or significant inflammatory changes. True cords symmetric and within normal limits. Subglottic airway clear. Salivary glands: Chronic fatty atrophy noted within the parotid and submandibular glands bilaterally. No acute inflammatory changes or  stones identified. 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 Right thyroid lobe is normal in appearance. Lymph nodes: No pathologically enlarged lymph nodes identified within the neck. Vascular: Moderate to advanced atherosclerotic change about the aortic arch and origin of the great vessels as well as the carotid bifurcations bilaterally. Right common and internal carotid arteries are medialized into the retropharyngeal space. Tortuosity of the distal left ICA closely approximates the left nasopharynx. Jugular veins patent bilaterally. Limited intracranial: Scattered vascular calcifications noted within the carotid siphons. Otherwise unremarkable. Visualized orbits: Patient status post bilateral ocular lens replacement. Globes and orbital soft tissues otherwise unremarkable. Mastoids and visualized paranasal sinuses: Few scattered maxillary retention cyst noted bilaterally. Visualized paranasal sinuses otherwise clear. Visualize mastoids and middle ear cavities are well pneumatized and free of fluid. Skeleton: No acute osseous abnormality. No worrisome lytic or blastic osseous lesions. 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 Chronic facet mediated grade 1 anterolisthesis of C7 on T1. Upper chest: Gaseous distension  of the visualized upper esophagus noted. Partially visualized upper chest demonstrates no other acute abnormality. Partially visualized lungs are clear. Other: None. IMPRESSION: 1. Mild mucosal edema involving the oropharyngeal mucosa, primarily on the left. Finding could reflect sequelae of mild pharyngitis. Correlation with physical exam recommended. 2. No other acute inflammatory changes identified within the neck. No discrete mass or adenopathy. 3. Medialization of the right carotid arteries into the retropharyngeal space. Query this as pulsatile lesion seen on physical exam. 4. Advanced atherosclerosis. Electronically Signed   By: Jeannine Boga M.D.   On: 11/06/2017 22:19      Assessment & Plan    1. NSTEMI - presented with progressive weakness over the past 3-4 weeks. Has baseline dyspnea on exertion but denies any recent changes in this. No recent chest pain or palpitations. Her weakness is likely multifactorial in the setting of her decreased oral intake, dehydration, and chronic anemia but this could also represent an anginal equivalent as she does have multiple cardiac risk factors. - Initial troponin was elevated to 0.39 with repeat value at 0.57. Continue to trend. EKG shows NSR, HR 88, with 1st degree AV Block, LAD, and LAFB. No prior tracings available for comparison.  - Agree with obtaining an echocardiogram to assess structural function. Pending enzyme trend and echo results, could consider further ischemic evaluation but would be hesitant to proceed with invasive evaluation at this time given her multiple medical issues as outlined in #5. Continue ASA and Heparin.   2. HTN - previously on Lisinopril-HCTZ and Amlodipine but Lisinopril-HCTZ was discontinued by her PCP on 10/31/2017 due to concerns for dehydration.  - BP has been well-controlled at 127/55 - 157/85 since admission. Continue Amlodipine 2.5mg  daily.   3. HLD - followed by PCP. Previously on Pravastatin and Fenofibrate but these were recently discontinued by Dr. Livia Snellen due to her poor PO intake.   4. Chronic Anemia - Hemoglobin stable at 9.4 on admission. She denies any evidence of active bleeding.  5. Hyponatremia/Dysphagia/ Unintentional Weight Loss - Na+ 125 on admission, improved to 130 this morning. Receiving IVF at 50 mL/hr. Would monitor fluid status closely as she does have trace edema on examination. - she has lost over 20 lbs within the past month due to decreased PO intake and issues with dysphagia. CT Neck on admission showed some mucosal edema. Consider Speech Therapy consult for swallow eval. Had been referred to ENT as an outpatient.     For questions or updates, please  contact [place] Please consult [email or web address] for contact info under Cardiology/STEMI.  Signed, Erma Heritage, PA-C 11/07/2017, 7:48 AM Pager: [phone]  The patient was seen and examined, and I agree with the history, physical exam, assessment and plan as documented above, with modifications as noted below. I have also personally reviewed all relevant documentation, old records, labs, and both radiographic and cardiovascular studies. I have also independently interpreted old and new ECG's.  Briefly, this is a 77 year old woman who has had increasing fatigue and generalized weakness over the past few months.  She has had decreased appetite with dysphagia and weight loss.  Her PCP discontinued several of her medications as detailed above.  She presented to the ED yesterday with increasing weakness.  She denies a personal history of MI.  He denies chest pain and worsening shortness of breath from chronic baseline exertional dyspnea.  Labs reviewed above with evidence of hyponatremia, anemia, and mildly elevated troponins of 0.39 and 0.57 and most recently 0.48.  I personally  reviewed the ECG which demonstrated sinus rhythm with first-degree AV block and left axis deviation.  Aortic atherosclerosis was seen on a chest x-ray on 10/31/2017.  CT of the neck performed yesterday also demonstrated advanced atherosclerotic changes around the aortic arch and origin of the great vessels as well as of the carotid bifurcations bilaterally.  She is currently on aspirin and heparin.  Sodium has increased to 130.  Hemoglobin yesterday was 9.4 and repeat CBC is pending.  An echocardiogram has been ordered and is pending.  Given her multiple medical issues I am not inclined to pursue cardiac catheterization and would manage her medically.  Heparin can be continued for a total of 48 hours if hemoglobin remains stable.  Continue aspirin.  It is unclear to me if her progressive weakness is related to  cardiac disease and is more likely multifactorial in etiology as noted above.  I will review the echocardiogram once completed.  I agree that she needs a speech therapy evaluation.   Kate Sable, MD, [facility]  11/07/2017 10:34 AM

## 2017-11-07 NOTE — Progress Notes (Signed)
PROGRESS NOTE    Shirley Brown  BXI:[id number] DOB: May 11, 1940 DOA: 11/06/2017 PCP: Claretta Fraise, MD    Brief Narrative:  77 year old female admitted to the hospital with sore throat, progressive weakness.  Found to have significant hyponatremia with a serum sodium of 125.  She had decreased p.o. intake and was failing to thrive.  She was also noted to have elevated troponin consistent with non-STEMI.  Started on intravenous heparin and seen by cardiology.  Recommendations for medical management.  Serum sodium improving with saline.   Assessment & Plan:   Principal Problem:   Hyponatremia Active Problems:   HLD (hyperlipidemia)   HTN (hypertension)   Hyperglycemia   GERD (gastroesophageal reflux disease)   Generalized anxiety disorder   Hypokalemia   Elevated troponin   Hypophosphatemia   Elevated AST (SGOT)   Anemia   Constipation   Skin lesion   1. Hyponatremia.  Likely related to decreased p.o. intake.  She was previously on thiazides which have been discontinued.  She was started on saline and has since improved.  Serum sodium is better.  Will discontinue further IV fluids. 2. Non-ST elevation MI.  Cardiology following.  Plans are for medical management.  Echocardiogram has been ordered.  Will likely get 48 hours of heparin.  Continue on aspirin.  No complaints of chest pain at this time. 3. Anemia.  Chronic, likely related to chronic disease.  Baseline hemoglobin around 9.  This has trended down to 8.3, which is likely related to hemodilution.  No signs of bleeding at this time.  Continue to monitor closely.  Will check stool for occult blood. 4. Hypertension.  Blood pressure currently stable.  Continue on amlodipine. 5. Oral thrush.  Started on Magic mouthwash. 6. Hypokalemia.  Replace 7. Generalized weakness.  Physical therapy evaluation 8. Skin lesion.  Noted to have crusted lesion on right buttocks.  Unclear if this is following a specific dermatome since there  is only one lesion.  Continue to monitor.   DVT prophylaxis: heparin infusion Code Status: full code Family Communication: no family present Disposition Plan: discharge home once improved   Consultants:   cardiology  Procedures:  Echo: - Left ventricle: There was mild to moderate concentric   hypertrophy. Systolic function was normal. The estimated ejection   fraction was in the range of 60% to 65%. Wall motion was normal;   there were no regional wall motion abnormalities. Findings   consistent with left ventricular diastolic dysfunction, grade   indeterminate. Doppler parameters are consistent with high   ventricular filling pressure. - Mitral valve: Moderately to severely calcified annulus. There was   mild regurgitation. - Left atrium: The atrium was moderately to severely dilated. - Right atrium: The atrium was mildly dilated. - Inferior vena cava: The vessel was dilated. The respirophasic   diameter changes were blunted (< 50%), consistent with elevated    central venous pressure. Estimated CVP 15 mmHg.  Antimicrobials:       Subjective: Feeling better. No chest pain. No bowel movements  Objective: Vitals:   11/07/17 1148 11/07/17 1200 11/07/17 1300 11/07/17 1400  BP:  139/65 (!) 145/69 130/65  Pulse:  99 (!) 107 99  Resp:  17 (!) 22 13  Temp: 99 F (37.2 C)     TempSrc: Oral     SpO2:  99% 99% 99%  Weight:      Height:        Intake/Output Summary (Last 24 hours) at 11/07/2017 1541 Last data filed  at 11/07/2017 1400 Gross per 24 hour  Intake 1952.29 ml  Output 1600 ml  Net 352.29 ml   Filed Weights   11/07/17 0029 11/07/17 0500  Weight: 68.9 kg 68.9 kg    Examination:  General exam: Appears calm and comfortable  Respiratory system: Clear to auscultation. Respiratory effort normal. Cardiovascular system: S1 & S2 heard, RRR. No JVD, murmurs, rubs, gallops or clicks. No pedal edema. Gastrointestinal system: Abdomen is nondistended, soft and  nontender. No organomegaly or masses felt. Normal bowel sounds heard. Central nervous system: Alert and oriented. No focal neurological deficits. Extremities: Symmetric 5 x 5 power. Skin: small ulceration on right buttocks Psychiatry: Judgement and insight appear normal. Mood & affect appropriate.   Data Reviewed: I have personally reviewed following labs and imaging studies  CBC: Recent Labs  Lab 11/06/17 2103 11/07/17 1412  WBC 4.1 4.0  NEUTROABS 2.1  --   HGB 9.4* 8.3*  HCT 26.5* 24.2*  MCV 91.7 92.0  PLT 290 086   Basic Metabolic Panel: Recent Labs  Lab 11/06/17 2103 11/07/17 0129 11/07/17 1412  NA 125* 130* 134*  K 3.0*  --  3.4*  CL 93*  --  103  CO2 23  --  25  GLUCOSE 116*  --  110*  BUN 13  --  7*  CREATININE 0.76  --  0.70  CALCIUM 9.9  --  8.9  MG 1.7  --   --   PHOS 2.3*  --   --    GFR: Estimated Creatinine Clearance: 55.7 mL/min (by C-G formula based on SCr of 0.7 mg/dL). Liver Function Tests: Recent Labs  Lab 11/06/17 2038  AST 61*  ALT 25  ALKPHOS 42  BILITOT 1.1  PROT 7.7  ALBUMIN 4.4   No results for input(s): LIPASE, AMYLASE in the last 168 hours. No results for input(s): AMMONIA in the last 168 hours. Coagulation Profile: Recent Labs  Lab 11/07/17 0728  INR 1.30   Cardiac Enzymes: Recent Labs  Lab 11/06/17 2038 11/07/17 0245 11/07/17 0728 11/07/17 1412  TROPONINI 0.39* 0.57* 0.48* 0.53*   BNP (last 3 results) No results for input(s): PROBNP in the last 8760 hours. HbA1C: No results for input(s): HGBA1C in the last 72 hours. CBG: No results for input(s): GLUCAP in the last 168 hours. Lipid Profile: No results for input(s): CHOL, HDL, LDLCALC, TRIG, CHOLHDL, LDLDIRECT in the last 72 hours. Thyroid Function Tests: No results for input(s): TSH, T4TOTAL, FREET4, T3FREE, THYROIDAB in the last 72 hours. Anemia Panel: No results for input(s): VITAMINB12, FOLATE, FERRITIN, TIBC, IRON, RETICCTPCT in the last 72 hours. Sepsis  Labs: No results for input(s): PROCALCITON, LATICACIDVEN in the last 168 hours.  Recent Results (from the past 240 hour(s))  Microscopic Examination     Status: None   Collection Time: 10/31/17 11:31 AM  Result Value Ref Range Status   WBC, UA 0-5 0 - 5 /hpf Final   RBC, UA None seen 0 - 2 /hpf Final   Epithelial Cells (non renal) 0-10 0 - 10 /hpf Final   Renal Epithel, UA None seen None seen /hpf Final   Bacteria, UA None seen None seen/Few Final  Urine Culture     Status: None   Collection Time: 10/31/17 12:55 PM  Result Value Ref Range Status   Urine Culture, Routine Final report  Final   Organism ID, Bacteria Comment  Final    Comment: Culture shows less than 10,000 colony forming units of bacteria per milliliter of  urine. This colony count is not generally considered to be clinically significant.   MRSA PCR Screening     Status: None   Collection Time: 11/07/17 12:19 AM  Result Value Ref Range Status   MRSA by PCR NEGATIVE NEGATIVE Final    Comment:        The GeneXpert MRSA Assay (FDA approved for NASAL specimens only), is one component of a comprehensive MRSA colonization surveillance program. It is not intended to diagnose MRSA infection nor to guide or monitor treatment for MRSA infections. Performed at [facility], [address] [place], [place] [address]          Radiology Studies: Ct Soft Tissue Neck W Contrast  Result Date: 11/06/2017 CLINICAL DATA:  Initial evaluation for acute sore throat for 6 weeks. History of throat cancer. Pulsatile mass. EXAM: CT NECK WITH CONTRAST TECHNIQUE: Multidetector CT imaging of the neck was performed using the standard protocol following the bolus administration of intravenous contrast. CONTRAST:  40mL OMNIPAQUE IOHEXOL 300 MG/ML  SOLN COMPARISON:  None available. FINDINGS: Pharynx and larynx: Oral cavity within normal limits without discrete mass or loculated collection. No acute inflammatory changes about the dentition.  Discernible or discrete base of tongue mass. Palatine tonsils appear to be absent. There is mild mucosal edema involving the oropharyngeal mucosa, more prominent on the left (series 2, image 34), which could reflect acute pharyngitis. Parapharyngeal fat relatively well maintained. Nasopharynx within normal limits. No retropharyngeal collection or effusion. Epiglottis within normal limits. Vallecula partially effaced and not well evaluated. No other discrete mass or significant inflammatory changes. True cords symmetric and within normal limits. Subglottic airway clear. Salivary glands: Chronic fatty atrophy noted within the parotid and submandibular glands bilaterally. No acute inflammatory changes or stones identified. 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 Right thyroid lobe is normal in appearance. Lymph nodes: No pathologically enlarged lymph nodes identified within the neck. Vascular: Moderate to advanced atherosclerotic change about the aortic arch and origin of the great vessels as well as the carotid bifurcations bilaterally. Right common and internal carotid arteries are medialized into the retropharyngeal space. Tortuosity of the distal left ICA closely approximates the left nasopharynx. Jugular veins patent bilaterally. Limited intracranial: Scattered vascular calcifications noted within the carotid siphons. Otherwise unremarkable. Visualized orbits: Patient status post bilateral ocular lens replacement. Globes and orbital soft tissues otherwise unremarkable. Mastoids and visualized paranasal sinuses: Few scattered maxillary retention cyst noted bilaterally. Visualized paranasal sinuses otherwise clear. Visualize mastoids and middle ear cavities are well pneumatized and free of fluid. Skeleton: No acute osseous abnormality. No worrisome lytic or blastic osseous lesions. 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 Chronic facet mediated grade 1 anterolisthesis of C7 on T1. Upper chest:  Gaseous distension of the visualized upper esophagus noted. Partially visualized upper chest demonstrates no other acute abnormality. Partially visualized lungs are clear. Other: None. IMPRESSION: 1. Mild mucosal edema involving the oropharyngeal mucosa, primarily on the left. Finding could reflect sequelae of mild pharyngitis. Correlation with physical exam recommended. 2. No other acute inflammatory changes identified within the neck. No discrete mass or adenopathy. 3. Medialization of the right carotid arteries into the retropharyngeal space. Query this as pulsatile lesion seen on physical exam. 4. Advanced atherosclerosis. Electronically Signed   By: Jeannine Boga M.D.   On: 11/06/2017 22:19        Scheduled Meds: . amLODipine  2.5 mg Oral Daily  . aspirin EC  81 mg Oral Daily  . clotrimazole  10 mg  Oral 5 X Daily  . DULoxetine  30 mg Oral Daily  . folic acid  1 mg Oral Daily  . mouth rinse  15 mL Mouth Rinse BID  . pantoprazole  40 mg Oral BID  . phosphorus  500 mg Oral TID WC  . zolpidem  5 mg Oral QHS   Continuous Infusions: . 0.9 % NaCl with KCl 40 mEq / L 50 mL/hr at 11/07/17 1400  . heparin 750 Units/hr (11/07/17 1400)     LOS: 1 day    Time spent: 33mins    Kathie Dike, MD [facility] Pager [phone]  If 7PM-7AM, please contact night-coverage [email or web address] Password [email or web address] 11/07/2017, 3:41 PM

## 2017-11-07 NOTE — H&P (Addendum)
History and Physical    Shirley Brown [id number] DOB: December 06, 1940 DOA: 11/06/2017  PCP: Claretta Fraise, MD   Patient coming from: Home.   I have personally briefly reviewed patient's old medical records in [place]  Chief Complaint: Sore throat.  HPI: Shirley Brown is a 77 y.o. female with medical history significant of allergy to meropenem, anemia, osteoarthritis, left tonsillar cancer, history of carotid artery occlusion, depression, hyperlipidemia, hypertension who is coming to the emergency department due to sore throat for the past 6 weeks and progressively worse weakness for the past 3 weeks or so.  The patient's son and daughter-in-law stated that she was started treatment with lisinopril-hydrochlorothiazide combination tablet about 4 weeks ago.  She subsequently had this medication discontinued about a week ago.  They also mentioned that she has had decreased appetite and significant weight loss over the past 2 to 3 months.  She mentions not being able to sleep well and having trouble falling asleep at night.  She denies depression or anxiety though.  There has not being any fever, productive cough, wheezing, chest pain, palpitations, dizziness, diaphoresis, PND or orthopnea.  The patient gets frequent lower extremity edema.  She has frequent constipation, but denies abdominal pain, nausea, emesis, diarrhea, melena or hematochezia.  Has some frequency, but no dysuria or hematuria.  She denies polyuria, polydipsia, polyphagia or blurred vision.  Shingles has some skin lesions on right lower back/gluteal area, which she was told that was zoster.  ED Course: Vital signs temperature 97.9 F, pulse 96, respirations 20, blood pressure 157/76 mmHg and O2 sat 98% on room air.  The patient received 1 L normal saline bolus in the emergency department.  Urinalysis shows throat color, but recently was otherwise normal.  White count 4.1, hemoglobin 9.4 g/dL and platelets 290.   Sodium was 125, potassium 3.0, chloride 93 and CO2 23 mmol/L.  Renal function was normal.  Glucose 116, calcium 9.9, magnesium 1.7 and phosphorus 2.3 mg/dL.  Imaging: CT neck of soft tissue which showed mild mucosal edema involving the oropharyngeal mucosa, primarily on the left.  Finding could reflect sequela of mild pharyngitis.  No other acute inflammatory changes identified within the neck.  No discrete mass or adenopathy. Medialization of the right coronary arteries into the retroperitoneal space. Query this as pulsatile lesion seen on physical exam.  Advanced atherosclerosis.  Please see images and full radiology report for further detail.  Review of Systems: As per HPI otherwise 10 point review of systems negative.    Past Medical History:  Diagnosis Date  . Allergy to meropenem   . Anemia   . Arthritis   . Cancer ([place])    left tonsillar  . Carotid artery occlusion 05/11/09   Bruit - Right  . Depression 2011  . Hyperlipidemia   . Hypertension     Past Surgical History:  Procedure Laterality Date  . APPENDECTOMY    . CHOLECYSTECTOMY    . JOINT REPLACEMENT Right 1995     Knee  . JOINT REPLACEMENT Left 2001   knee  . THROAT SURGERY Left    tonsil cancer-Left    6 1/2 weeks Radiation     reports that she quit smoking about 24 years ago. Her smoking use included cigarettes. She has never used smokeless tobacco. She reports that she does not drink alcohol or use drugs.  Allergies  Allergen Reactions  . Bupropion Other (See Comments)    Lips swelled  . Sulfa Antibiotics Itching and  Rash  . Benadryl [Diphenhydramine Hcl (Sleep)] Other (See Comments)    Insomnia   . Doxycycline Nausea And Vomiting    Dizziness   . Gabapentin Other (See Comments)    Drunk feeling, staggering  . Desyrel [Trazodone] Other (See Comments)    Keeps me up all night    Family History  Problem Relation Age of Onset  . Heart disease Father   . Heart attack Father   . Hyperlipidemia Father     . Hypertension Father   . Cancer Brother        Throat and Lung  . Diabetes Son   . Hyperlipidemia Son   . Heart disease Sister        before age 71  . Hyperlipidemia Sister   . Hypertension Sister   . Other Sister        varicose veins   Prior to Admission medications   Medication Sig Start Date End Date Taking? Authorizing Provider  acyclovir (ZOVIRAX) 400 MG tablet TAKE 1 TABLET BY MOUTH ONCE DAILY Patient taking differently: Take 400 mg by mouth daily.  07/25/17  Yes Stacks, Cletus Gash, MD  amLODipine (NORVASC) 2.5 MG tablet Take 1 tablet (2.5 mg total) by mouth daily. 10/31/17  Yes Claretta Fraise, MD  aspirin 81 MG tablet Take 81 mg by mouth daily.   Yes [provider]  Cholecalciferol (VITAMIN D3) 3000 UNITS TABS Take 1 tablet by mouth daily.    Yes [provider]  clotrimazole (MYCELEX) 10 MG troche Allow one to dissolve in the mouth 5 times daily For yeast Patient taking differently: Take 10 mg by mouth 5 (five) times daily. For yeast 11/01/17  Yes Stacks, Cletus Gash, MD  Cyanocobalamin (VITAMIN B 12 PO) Take 1 tablet by mouth daily.    Yes [provider]  DULoxetine (CYMBALTA) 30 MG capsule Take 1 capsule (30 mg total) by mouth daily. 10/31/17  Yes Stacks, Cletus Gash, MD  folic acid (FOLVITE) 1 MG tablet TAKE 1 TABLET BY MOUTH ONCE DAILY Patient taking differently: Take 1 mg by mouth daily.  11/01/17  Yes Stacks, Cletus Gash, MD  hydrOXYzine (VISTARIL) 25 MG capsule TAKE 1 CAPSULE (25 MG TOTAL) BY MOUTH 2 (TWO) TIMES DAILY AS NEEDED. Patient taking differently: Take 25 mg by mouth 2 (two) times daily as needed for anxiety.  11/02/17  Yes Stacks, Cletus Gash, MD  linaclotide Rolan Lipa) 145 MCG CAPS capsule Take 1 capsule (145 mcg total) by mouth daily. To regulate bowel movements 10/20/17  Yes Stacks, Cletus Gash, MD  pantoprazole (PROTONIX) 40 MG tablet TAKE 1 TABLET BY MOUTH TWICE A DAY Patient taking differently: Take 40 mg by mouth 2 (two) times daily.  09/28/17  Yes Stacks,  Cletus Gash, MD  zolpidem (AMBIEN) 10 MG tablet TAKE 1 TABLET BY MOUTH EVERY DAY AT BEDTIME AS NEEDED Patient taking differently: Take 10 mg by mouth at bedtime.  10/09/17  Yes Claretta Fraise, MD    Physical Exam: Vitals:   11/06/17 2032 11/06/17 2212 11/06/17 2215  BP: (!) 157/76 (!) 151/85   Pulse: 96 (!) 108 (!) 103  Resp: 20 (!) 25 15  Temp: 97.9 F (36.6 C)    TempSrc: Oral    SpO2: 98% 100% 99%    Constitutional: NAD, calm, comfortable Eyes: PERRL, lids and conjunctivae normal ENMT: Mucous membranes are moist. Posterior pharynx is erythematosus with mild erythema. Neck: Normal, supple, no masses, no thyromegaly.  No adenopathy.  Positive right carotid bruit. Respiratory: Subtle expiratory wheezing, no crackles. Normal respiratory effort.  No accessory muscle use.  Cardiovascular: Tachycardic at 104 bpm, no murmurs / rubs / gallops. No extremity edema. 2+ pedal pulses. No carotid bruits.  Abdomen: Soft, no tenderness, no masses palpated. No hepatosplenomegaly. Bowel sounds positive.  Musculoskeletal: no clubbing / cyanosis. Good ROM, no contractures. Normal muscle tone.  Skin: Areas of venous superficial hypervascularity on lower extremities.  Scattered small ecchymosis on extremities. Neurologic: CN 2-12 grossly intact. Sensation intact, DTR normal.  Generalized weakness. Psychiatric: Normal judgment and insight. Alert and oriented x 3. Normal mood.       Labs on Admission: I have personally reviewed following labs and imaging studies.  CBC: Recent Labs  Lab 10/31/17 1415 11/06/17 2103  WBC 5.1 4.1  NEUTROABS 3.2 2.1  HGB 9.0* 9.4*  HCT 26.7* 26.5*  MCV 86 91.7  PLT 325 671   Basic Metabolic Panel: Recent Labs  Lab 10/31/17 1415 11/06/17 2103  NA 126* 125*  K 4.0 3.0*  CL 89* 93*  CO2 20 23  GLUCOSE 101* 116*  BUN 22 13  CREATININE 1.12* 0.76  CALCIUM 10.6* 9.9  MG  --  1.7  PHOS  --  2.3*   GFR: Estimated Creatinine Clearance: 52 mL/min (by C-G formula  based on SCr of 0.76 mg/dL). Liver Function Tests: Recent Labs  Lab 10/31/17 1415 11/06/17 2038  AST 62* 61*  ALT 20 25  ALKPHOS 49 42  BILITOT 0.9 1.1  PROT 7.1 7.7  ALBUMIN 4.5 4.4   No results for input(s): LIPASE, AMYLASE in the last 168 hours. No results for input(s): AMMONIA in the last 168 hours. Coagulation Profile: No results for input(s): INR, PROTIME in the last 168 hours. Cardiac Enzymes: Recent Labs  Lab 11/06/17 2038  TROPONINI 0.39*   BNP (last 3 results) No results for input(s): PROBNP in the last 8760 hours. HbA1C: No results for input(s): HGBA1C in the last 72 hours. CBG: No results for input(s): GLUCAP in the last 168 hours. Lipid Profile: No results for input(s): CHOL, HDL, LDLCALC, TRIG, CHOLHDL, LDLDIRECT in the last 72 hours. Thyroid Function Tests: No results for input(s): TSH, T4TOTAL, FREET4, T3FREE, THYROIDAB in the last 72 hours. Anemia Panel: No results for input(s): VITAMINB12, FOLATE, FERRITIN, TIBC, IRON, RETICCTPCT in the last 72 hours. Urine analysis:    Component Value Date/Time   COLORURINE STRAW (A) 11/06/2017 2034   APPEARANCEUR CLEAR 11/06/2017 2034   APPEARANCEUR Clear 10/31/2017 1131   LABSPEC 1.005 11/06/2017 2034   PHURINE 7.0 11/06/2017 2034   GLUCOSEU NEGATIVE 11/06/2017 2034   HGBUR NEGATIVE 11/06/2017 2034   BILIRUBINUR NEGATIVE 11/06/2017 2034   BILIRUBINUR Negative 10/31/2017 [place] 11/06/2017 2034   PROTEINUR NEGATIVE 11/06/2017 2034   NITRITE NEGATIVE 11/06/2017 2034   LEUKOCYTESUR NEGATIVE 11/06/2017 2034   LEUKOCYTESUR Trace (A) 10/31/2017 1131    Radiological Exams on Admission: Ct Soft Tissue Neck W Contrast  Result Date: 11/06/2017 CLINICAL DATA:  Initial evaluation for acute sore throat for 6 weeks. History of throat cancer. Pulsatile mass. EXAM: CT NECK WITH CONTRAST TECHNIQUE: Multidetector CT imaging of the neck was performed using the standard protocol following the bolus  administration of intravenous contrast. CONTRAST:  28mL OMNIPAQUE IOHEXOL 300 MG/ML  SOLN COMPARISON:  None available. FINDINGS: Pharynx and larynx: Oral cavity within normal limits without discrete mass or loculated collection. No acute inflammatory changes about the dentition. Discernible or discrete base of tongue mass. [place] tonsils appear to be absent. There is mild mucosal edema involving the oropharyngeal  mucosa, more prominent on the left (series 2, image 34), which could reflect acute pharyngitis. Parapharyngeal fat relatively well maintained. Nasopharynx within normal limits. No retropharyngeal collection or effusion. Epiglottis within normal limits. Vallecula partially effaced and not well evaluated. No other discrete mass or significant inflammatory changes. True cords symmetric and within normal limits. Subglottic airway clear. Salivary glands: Chronic fatty atrophy noted within the parotid and submandibular glands bilaterally. No acute inflammatory changes or stones identified. 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 Right thyroid lobe is normal in appearance. Lymph nodes: No pathologically enlarged lymph nodes identified within the neck. Vascular: Moderate to advanced atherosclerotic change about the aortic arch and origin of the great vessels as well as the carotid bifurcations bilaterally. Right common and internal carotid arteries are medialized into the retropharyngeal space. Tortuosity of the distal left ICA closely approximates the left nasopharynx. Jugular veins patent bilaterally. Limited intracranial: Scattered vascular calcifications noted within the carotid siphons. Otherwise unremarkable. Visualized orbits: Patient status post bilateral ocular lens replacement. Globes and orbital soft tissues otherwise unremarkable. Mastoids and visualized paranasal sinuses: Few scattered maxillary retention cyst noted bilaterally. Visualized paranasal sinuses otherwise clear. Visualize  mastoids and middle ear cavities are well pneumatized and free of fluid. Skeleton: No acute osseous abnormality. No worrisome lytic or blastic osseous lesions. 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 Chronic facet mediated grade 1 anterolisthesis of C7 on T1. Upper chest: Gaseous distension of the visualized upper esophagus noted. Partially visualized upper chest demonstrates no other acute abnormality. Partially visualized lungs are clear. Other: None. IMPRESSION: 1. Mild mucosal edema involving the oropharyngeal mucosa, primarily on the left. Finding could reflect sequelae of mild pharyngitis. Correlation with physical exam recommended. 2. No other acute inflammatory changes identified within the neck. No discrete mass or adenopathy. 3. Medialization of the right carotid arteries into the retropharyngeal space. Query this as pulsatile lesion seen on physical exam. 4. Advanced atherosclerosis. Electronically Signed   By: Jeannine Boga M.D.   On: 11/06/2017 22:19    EKG: Independently reviewed.  Vent. rate 88 BPM PR interval * ms QRS duration 116 ms QT/QTc 383/464 ms P-R-T axes 44 -69 33 Sinus rhythm Borderline prolonged PR interval LAD, consider left anterior fascicular block  Assessment/Plan Principal Problem:   Hyponatremia Admit to stepdown/inpatient. Check serum and urine osmolality. Check urine sodium and potassium. Initially received a 1000 mL NS bolus in the ED. I had ordered 3% saline earlier, but her follow-up sodium increased to 130 mmol/L. So sodium chloride 3% infusion was discontinued.  Started on sodium chloride 0.9% +40 mEq of KCl for 20 hours.  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  Linzess was discontinued due to its occasional association with hyponatremia secondary to GI sodium secretion or inhibition of absorption.  Fluid restriction.  Monitor intake and output.  Consider renal consult if no  improvement  Active Problems:   Elevated troponin Discussed by Dr. Roderic Palau with cardiology on-call. No need to transfer to [facility] at this time per cardiology service. Trend troponin level. Serial EKGs. Obtain echocardiogram. Routine cardiology consult in a.m.    Hypokalemia Likely secondary to use of HCTZ and decreased oral intake. Replacing. Follow-up potassium level.    Hypophosphatemia Replacing. Follow-up level as needed.    Skin lesion Per patient, she was diagnosed with zoster and started on acyclovir recently. Isolation for H. Zoster started. However, I am not sure if lesions are from it.    HLD (hyperlipidemia) Not  on medical therapy. Continue lifestyle modifications. To be discussed with her PCP.    HTN (hypertension) Continue amlodipine 2.5 mg p.o. daily. Monitor blood pressure.    Hyperglycemia Check fasting glucose. Further work-up depending on FG result.    GERD (gastroesophageal reflux disease) Continue pantoprazole 40 mg p.o. twice daily.    Generalized anxiety disorder Continue duloxetine 30 mg p.o. daily. Continue Vistaril 25 mg p.o. twice daily as needed    Elevated AST (SGOT) Repeat transaminases in a.m.    Anemia Anemia panel performed earlier this year only showed mildly elevated ferritin. Monitor hematocrit and hemoglobin.    Constipation Continue Linzess 145 mcg p.o. daily.    DVT prophylaxis: Lovenox SQ. Code Status: Full code. Family Communication: Her son and granddaughter were in the ED room with her and provided most of the history. Disposition Plan: Admit for hyponatremia/elevated troponin work-up and treatment Consults called: Cardiology on-call was called by Dr. Roderic Palau.  They suggested troponin level trending, serial EKGs and cardiology consult in a.m. Admission status: Inpatient/stepdown.   Reubin Milan MD [facility] Pager [phone]  If 7PM-7AM, please contact night-coverage [email or web address] Password  [email or web address]  11/06/2017, 11:35 PM

## 2017-11-07 NOTE — Progress Notes (Signed)
CRITICAL VALUE ALERT  Critical Value:  Troponin 0.57 Date & Time Notied: 11/07/17 0400  Provider Notified: Olevia Bowens Orders Received/Actions taken:  No new orders at this time

## 2017-11-07 NOTE — Progress Notes (Signed)
Night shift stepdown coverage note.  Mrs. Shirley Brown second troponin level increased from 0.39 to 0.57 ng/mL.  Cardiology on-call was notified and asked about heparin continuous infusion.  They agreed to start heparin and continue with current plan.  Tennis Must, MD

## 2017-11-08 DIAGNOSIS — R51 Headache: Secondary | ICD-10-CM

## 2017-11-08 LAB — MAGNESIUM: MAGNESIUM: 1.8 mg/dL (ref 1.7–2.4)

## 2017-11-08 LAB — CBC
HEMATOCRIT: 24.3 % — AB (ref 36.0–46.0)
HEMOGLOBIN: 8.3 g/dL — AB (ref 12.0–15.0)
MCH: 31.2 pg (ref 26.0–34.0)
MCHC: 34.2 g/dL (ref 30.0–36.0)
MCV: 91.4 fL (ref 80.0–100.0)
NRBC: 0 % (ref 0.0–0.2)
Platelets: 281 10*3/uL (ref 150–400)
RBC: 2.66 MIL/uL — ABNORMAL LOW (ref 3.87–5.11)
RDW: 15.5 % (ref 11.5–15.5)
WBC: 5.2 10*3/uL (ref 4.0–10.5)

## 2017-11-08 LAB — BASIC METABOLIC PANEL
Anion gap: 8 (ref 5–15)
BUN: 6 mg/dL — AB (ref 8–23)
CHLORIDE: 101 mmol/L (ref 98–111)
CO2: 24 mmol/L (ref 22–32)
CREATININE: 0.63 mg/dL (ref 0.44–1.00)
Calcium: 8.8 mg/dL — ABNORMAL LOW (ref 8.9–10.3)
GFR calc Af Amer: >60 mL/min (ref >60)
GFR calc non Af Amer: >60 mL/min (ref >60)
GLUCOSE: 86 mg/dL (ref 70–99)
Potassium: 3.9 mmol/L (ref 3.5–5.1)
SODIUM: 133 mmol/L — AB (ref 135–145)

## 2017-11-08 LAB — URINALYSIS, ROUTINE W REFLEX MICROSCOPIC
Bilirubin Urine: NEGATIVE
Glucose, UA: NEGATIVE mg/dL
Ketones, ur: NEGATIVE mg/dL
Nitrite: NEGATIVE
PROTEIN: NEGATIVE mg/dL
SPECIFIC GRAVITY, URINE: 1.004 — AB (ref 1.005–1.030)
pH: 7 (ref 5.0–8.0)

## 2017-11-08 LAB — TROPONIN I
Troponin I: 0.62 ng/mL (ref <0.03)
Troponin I: 0.55 ng/mL (ref <0.03)

## 2017-11-08 LAB — POTASSIUM: Potassium: 3.3 mmol/L — ABNORMAL LOW (ref 3.5–5.1)

## 2017-11-08 LAB — HEPARIN LEVEL (UNFRACTIONATED)
Heparin Unfractionated: 0.35 IU/mL (ref 0.30–0.70)
Heparin Unfractionated: 0.46 IU/mL (ref 0.30–0.70)

## 2017-11-08 MED ORDER — MORPHINE SULFATE (PF) 2 MG/ML IV SOLN
2.0000 mg | INTRAVENOUS | Status: AC | PRN
  Administered 2017-11-08 (×2): 2 mg via INTRAVENOUS
  Filled 2017-11-08 (×4): qty 1

## 2017-11-08 MED ORDER — POTASSIUM CHLORIDE 10 MEQ/100ML IV SOLN
10.0000 meq | INTRAVENOUS | Status: AC
  Administered 2017-11-08 (×3): 10 meq via INTRAVENOUS
  Filled 2017-11-08 (×3): qty 100

## 2017-11-08 MED ORDER — MAGNESIUM SULFATE 2 GM/50ML IV SOLN
2.0000 g | Freq: Once | INTRAVENOUS | Status: AC
  Administered 2017-11-08: 2 g via INTRAVENOUS
  Filled 2017-11-08: qty 50

## 2017-11-08 NOTE — Progress Notes (Signed)
ANTICOAGULATION CONSULT NOTE -  Pharmacy Consult for Heparin Indication: ACS/stemi  Allergies  Allergen Reactions  . Bupropion Other (See Comments)    Lips swelled  . Sulfa Antibiotics Itching and Rash  . Benadryl [Diphenhydramine Hcl (Sleep)] Other (See Comments)    Insomnia   . Doxycycline Nausea And Vomiting    Dizziness   . Gabapentin Other (See Comments)    Drunk feeling, staggering  . Desyrel [Trazodone] Other (See Comments)    Keeps me up all night    Patient Measurements: Height: 5\' 3"  (160 cm) Weight: 150 lb 12.7 oz (68.4 kg) IBW/kg (Calculated) : 52.4 HEPARIN DW (KG): 66.5   Vital Signs: Temp: 97.4 F (36.3 C) (10/16 0812) Temp Source: Oral (10/16 0812) BP: 139/63 (10/16 0942) Pulse Rate: 90 (10/16 0812)  Labs: Recent Labs    11/06/17 2103  11/07/17 0728 11/07/17 1412 11/07/17 2204 11/08/17 0006 11/08/17 0410  HGB 9.4*  --   --  8.3*  --   --  8.3*  HCT 26.5*  --   --  24.2*  --   --  24.3*  PLT 290  --   --  297  --   --  281  APTT  --   --  >200*  --   --   --   --   LABPROT  --   --  16.0*  --   --   --   --   INR  --   --  1.30  --   --   --   --   HEPARINUNFRC  --   --   --  0.26*  --  0.35 0.46  CREATININE 0.76  --   --  0.70  --   --  0.63  TROPONINI  --    < > 0.48* 0.53* 0.62*  --  0.55*   < > = values in this interval not displayed.   Estimated Creatinine Clearance: 55.5 mL/min (by C-G formula based on SCr of 0.63 mg/dL).  Medical History: Past Medical History:  Diagnosis Date  . Allergy to meropenem   . Anemia   . Arthritis   . Cancer ([place])    left tonsillar  . Carotid artery occlusion 05/11/09   Bruit - Right  . Depression 2011  . Hyperlipidemia   . Hypertension     Medications:   Assessment: 77 yo female admitted to the ICU for management of hyponatremia and elevated troponin. Pt complains of increasing weakness, loss of appetite x several weeks and sore throat. Pt without complaints of chest pain or SOB. Cardiology has  been consulted and recommends serial EKGs and troponins. Pharmacy has been consulted for IV heparin dosing.  Heparin level is therapeutic this AM and MD plans x 9more hours of heparin. Troponins have peaked  Goal of Therapy:  Heparin level goal: 0.3-0.7 units/ml Monitor platelets by anticoagulation protocol: Yes   Plan:  Continue Heparin infusion at 900 units/hr Heparin level in 8 hours Monitor for S/S of bleeding  Isac Sarna, BS [place]ena [place], BCPS Clinical Pharmacist Pager [phone]  11/08/2017 10:47 AM

## 2017-11-08 NOTE — Care Management Important Message (Signed)
Important Message  Patient Details  Name: Shirley Brown MRN: [id number] Date of Birth: 1941/01/15   Medicare Important Message Given:  Yes  Gave lettertoNCM, pt. On precaution.  Holli Humbles Smith 11/08/2017, 2:47 PM

## 2017-11-08 NOTE — Progress Notes (Signed)
PROGRESS NOTE    Shirley Brown  QPR:[id number] DOB: 1940/06/14 DOA: 11/06/2017 PCP: Claretta Fraise, MD     Brief Narrative:  77 year old woman admitted from home on 10/14 with progressive weakness and sore throat.  She was found to be significantly hyponatremic with a sodium of 125.  She was also noted to have an elevated troponin consistent with a non-ST elevated MI.  Has been seen in consultation by cardiology with recommendations for medical management.   Assessment & Plan:   Principal Problem:   Hyponatremia Active Problems:   HLD (hyperlipidemia)   HTN (hypertension)   Hyperglycemia   GERD (gastroesophageal reflux disease)   Generalized anxiety disorder   Hypokalemia   Elevated troponin   Hypophosphatemia   Elevated AST (SGOT)   Anemia   Constipation   Skin lesion   Hyponatremia -Likely secondary to thiazide diuretics in addition to poor p.o. intake and failure to thrive. -She was started on saline which has since been discontinued. -Sodium today is 133.  Non-ST elevated MI -Has been seen by cardiology, plan for medical management. -Plan to continue heparin for 48 hours. -Continue chest pain.  No sign-she currently does not complain of chest pain or shortness of breath. -2D echo with ejection fraction of 60 to 65% with no wall motion abnormalities, grade indeterminant left ventricular diastolic dysfunction.  Anemia of chronic disease -Baseline hemoglobin is around 9, has been hovering around 8.3 this admission.  No plans for transfusion at present.  Hypertension -Blood pressure stable, continue Norvasc.  Generalized weakness -We will obtain PT evaluation once she is off heparin to determine next venue of care.   DVT prophylaxis: Currently on IV heparin Code Status: Full code Family Communication: No family at bedside Disposition Plan: Consider discharge over next 24 to 48 hours  Consultants:   Cardiology  Procedures:   Echo as  above  Antimicrobials:  Anti-infectives (From admission, onward)   Start     Dose/Rate Route Frequency Ordered Stop   11/07/17 1000  acyclovir (ZOVIRAX) tablet 400 mg  Status:  Discontinued     400 mg Oral Daily 11/07/17 0104 11/07/17 0204       Subjective: Lying in bed, feels weak, complains of a frontal headache, denies chest pain or shortness of breath.  Objective: Vitals:   11/08/17 0500 11/08/17 0600 11/08/17 0812 11/08/17 0942  BP: (!) 141/67 (!) 147/76  139/63  Pulse: 88 92 90   Resp: 15 16 (!) 28   Temp:  98.2 F (36.8 C) (!) 97.4 F (36.3 C)   TempSrc:  Oral Oral   SpO2: 99% 99% 98%   Weight:      Height:        Intake/Output Summary (Last 24 hours) at 11/08/2017 1104 Last data filed at 11/08/2017 0832 Gross per 24 hour  Intake 1416.4 ml  Output 1850 ml  Net -433.6 ml   Filed Weights   11/07/17 0029 11/07/17 0500 11/08/17 0222  Weight: 68.9 kg 68.9 kg 68.4 kg    Examination:  General exam: Alert, awake, oriented x 3 Respiratory system: Clear to auscultation. Respiratory effort normal. Cardiovascular system:RRR. No murmurs, rubs, gallops. Gastrointestinal system: Abdomen is nondistended, soft and nontender. No organomegaly or masses felt. Normal bowel sounds heard. Central nervous system: Alert and oriented. No focal neurological deficits. Extremities: No C/C/E, +pedal pulses Skin: No rashes, lesions or ulcers Psychiatry: Judgement and insight appear normal. Mood & affect appropriate.     Data Reviewed: I have personally reviewed following labs  and imaging studies  CBC: Recent Labs  Lab 11/06/17 2103 11/07/17 1412 11/08/17 0410  WBC 4.1 4.0 5.2  NEUTROABS 2.1  --   --   HGB 9.4* 8.3* 8.3*  HCT 26.5* 24.2* 24.3*  MCV 91.7 92.0 91.4  PLT 290 297 774   Basic Metabolic Panel: Recent Labs  Lab 11/06/17 2103 11/07/17 0129 11/07/17 1412 11/07/17 2208 11/08/17 0410  NA 125* 130* 134*  --  133*  K 3.0*  --  3.4* 3.3* 3.9  CL 93*  --  103   --  101  CO2 23  --  25  --  24  GLUCOSE 116*  --  110*  --  86  BUN 13  --  7*  --  6*  CREATININE 0.76  --  0.70  --  0.63  CALCIUM 9.9  --  8.9  --  8.8*  MG 1.7  --   --  1.8  --   PHOS 2.3*  --   --   --   --    GFR: Estimated Creatinine Clearance: 55.5 mL/min (by C-G formula based on SCr of 0.63 mg/dL). Liver Function Tests: Recent Labs  Lab 11/06/17 2038  AST 61*  ALT 25  ALKPHOS 42  BILITOT 1.1  PROT 7.7  ALBUMIN 4.4   No results for input(s): LIPASE, AMYLASE in the last 168 hours. No results for input(s): AMMONIA in the last 168 hours. Coagulation Profile: Recent Labs  Lab 11/07/17 0728  INR 1.30   Cardiac Enzymes: Recent Labs  Lab 11/07/17 0245 11/07/17 0728 11/07/17 1412 11/07/17 2204 11/08/17 0410  TROPONINI 0.57* 0.48* 0.53* 0.62* 0.55*   BNP (last 3 results) No results for input(s): PROBNP in the last 8760 hours. HbA1C: No results for input(s): HGBA1C in the last 72 hours. CBG: No results for input(s): GLUCAP in the last 168 hours. Lipid Profile: No results for input(s): CHOL, HDL, LDLCALC, TRIG, CHOLHDL, LDLDIRECT in the last 72 hours. Thyroid Function Tests: No results for input(s): TSH, T4TOTAL, FREET4, T3FREE, THYROIDAB in the last 72 hours. Anemia Panel: No results for input(s): VITAMINB12, FOLATE, FERRITIN, TIBC, IRON, RETICCTPCT in the last 72 hours. Urine analysis:    Component Value Date/Time   COLORURINE STRAW (A) 11/06/2017 2034   APPEARANCEUR CLEAR 11/06/2017 2034   APPEARANCEUR Clear 10/31/2017 1131   LABSPEC 1.005 11/06/2017 2034   PHURINE 7.0 11/06/2017 2034   GLUCOSEU NEGATIVE 11/06/2017 2034   HGBUR NEGATIVE 11/06/2017 2034   [place] 11/06/2017 2034   BILIRUBINUR Negative 10/31/2017 [place] 11/06/2017 2034   PROTEINUR NEGATIVE 11/06/2017 2034   NITRITE NEGATIVE 11/06/2017 2034   LEUKOCYTESUR NEGATIVE 11/06/2017 2034   LEUKOCYTESUR Trace (A) 10/31/2017 1131   Sepsis  Labs: @LABRCNTIP (procalcitonin:4,lacticidven:4)  ) Recent Results (from the past 240 hour(s))  Microscopic Examination     Status: None   Collection Time: 10/31/17 11:31 AM  Result Value Ref Range Status   WBC, UA 0-5 0 - 5 /hpf Final   RBC, UA None seen 0 - 2 /hpf Final   Epithelial Cells (non renal) 0-10 0 - 10 /hpf Final   Renal Epithel, UA None seen None seen /hpf Final   Bacteria, UA None seen None seen/Few Final  Urine Culture     Status: None   Collection Time: 10/31/17 12:55 PM  Result Value Ref Range Status   Urine Culture, Routine Final report  Final   Organism ID, Bacteria Comment  Final    Comment:  Culture shows less than 10,000 colony forming units of bacteria per milliliter of urine. This colony count is not generally considered to be clinically significant.   MRSA PCR Screening     Status: None   Collection Time: 11/07/17 12:19 AM  Result Value Ref Range Status   MRSA by PCR NEGATIVE NEGATIVE Final    Comment:        The GeneXpert MRSA Assay (FDA approved for NASAL specimens only), is one component of a comprehensive MRSA colonization surveillance program. It is not intended to diagnose MRSA infection nor to guide or monitor treatment for MRSA infections. Performed at [facility], [address] [place], [place] [address]          Radiology Studies: Ct Soft Tissue Neck W Contrast  Result Date: 11/06/2017 CLINICAL DATA:  Initial evaluation for acute sore throat for 6 weeks. History of throat cancer. Pulsatile mass. EXAM: CT NECK WITH CONTRAST TECHNIQUE: Multidetector CT imaging of the neck was performed using the standard protocol following the bolus administration of intravenous contrast. CONTRAST:  27mL OMNIPAQUE IOHEXOL 300 MG/ML  SOLN COMPARISON:  None available. FINDINGS: Pharynx and larynx: Oral cavity within normal limits without discrete mass or loculated collection. No acute inflammatory changes about the dentition. Discernible or discrete base  of tongue mass. Palatine tonsils appear to be absent. There is mild mucosal edema involving the oropharyngeal mucosa, more prominent on the left (series 2, image 34), which could reflect acute pharyngitis. Parapharyngeal fat relatively well maintained. Nasopharynx within normal limits. No retropharyngeal collection or effusion. Epiglottis within normal limits. Vallecula partially effaced and not well evaluated. No other discrete mass or significant inflammatory changes. True cords symmetric and within normal limits. Subglottic airway clear. Salivary glands: Chronic fatty atrophy noted within the parotid and submandibular glands bilaterally. No acute inflammatory changes or stones identified. 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 Right thyroid lobe is normal in appearance. Lymph nodes: No pathologically enlarged lymph nodes identified within the neck. Vascular: Moderate to advanced atherosclerotic change about the aortic arch and origin of the great vessels as well as the carotid bifurcations bilaterally. Right common and internal carotid arteries are medialized into the retropharyngeal space. Tortuosity of the distal left ICA closely approximates the left nasopharynx. Jugular veins patent bilaterally. Limited intracranial: Scattered vascular calcifications noted within the carotid siphons. Otherwise unremarkable. Visualized orbits: Patient status post bilateral ocular lens replacement. Globes and orbital soft tissues otherwise unremarkable. Mastoids and visualized paranasal sinuses: Few scattered maxillary retention cyst noted bilaterally. Visualized paranasal sinuses otherwise clear. Visualize mastoids and middle ear cavities are well pneumatized and free of fluid. Skeleton: No acute osseous abnormality. No worrisome lytic or blastic osseous lesions. 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 Chronic facet mediated grade 1 anterolisthesis of C7 on T1. Upper chest: Gaseous distension of the  visualized upper esophagus noted. Partially visualized upper chest demonstrates no other acute abnormality. Partially visualized lungs are clear. Other: None. IMPRESSION: 1. Mild mucosal edema involving the oropharyngeal mucosa, primarily on the left. Finding could reflect sequelae of mild pharyngitis. Correlation with physical exam recommended. 2. No other acute inflammatory changes identified within the neck. No discrete mass or adenopathy. 3. Medialization of the right carotid arteries into the retropharyngeal space. Query this as pulsatile lesion seen on physical exam. 4. Advanced atherosclerosis. Electronically Signed   By: Jeannine Boga M.D.   On: 11/06/2017 22:19        Scheduled Meds: . amLODipine  2.5 mg Oral Daily  .  aspirin EC  81 mg Oral Daily  . clotrimazole  10 mg Oral 5 X Daily  . DULoxetine  30 mg Oral Daily  . folic acid  1 mg Oral Daily  . lidocaine  15 mL Mouth/Throat TID  . magic mouthwash  15 mL Oral TID  . mouth rinse  15 mL Mouth Rinse BID  . pantoprazole  40 mg Oral BID  . phosphorus  500 mg Oral TID WC  . zolpidem  5 mg Oral QHS   Continuous Infusions: . heparin 900 Units/hr (11/08/17 0832)     LOS: 2 days    Time spent: 25 minutes.     Lelon Frohlich, MD [facility] Pager [phone]  If 7PM-7AM, please contact night-coverage [email or web address] Password [email or web address] 11/08/2017, 11:04 AM

## 2017-11-08 NOTE — Progress Notes (Addendum)
Progress Note  Patient Name: Shirley Brown Date of Encounter: 11/08/2017  Primary Cardiologist: Kate Sable, MD   Subjective   She tells me this morning that she "hurts all over". By review of notes she had sternal discomfort overnight that improved with Maalox. She denies any sternal pain at this time but does report having a significant headache and that her "bladder hurts".   Inpatient Medications    Scheduled Meds: . amLODipine  2.5 mg Oral Daily  . aspirin EC  81 mg Oral Daily  . clotrimazole  10 mg Oral 5 X Daily  . DULoxetine  30 mg Oral Daily  . folic acid  1 mg Oral Daily  . lidocaine  15 mL Mouth/Throat TID  . magic mouthwash  15 mL Oral TID  . mouth rinse  15 mL Mouth Rinse BID  . pantoprazole  40 mg Oral BID  . phosphorus  500 mg Oral TID WC  . zolpidem  5 mg Oral QHS   Continuous Infusions: . heparin 900 Units/hr (11/08/17 0600)   PRN Meds: acetaminophen **OR** acetaminophen, alum & mag hydroxide-simeth, HYDROcodone-acetaminophen, hydrOXYzine, morphine injection, ondansetron **OR** ondansetron (ZOFRAN) IV   Vital Signs    Vitals:   11/08/17 0300 11/08/17 0400 11/08/17 0500 11/08/17 0600  BP: (!) 138/56 (!) 146/66 (!) 141/67 (!) 147/76  Pulse: 76 86 88 92  Resp: 13 15 15 16   Temp:    98.2 F (36.8 C)  TempSrc:    Oral  SpO2: 99% 100% 99% 99%  Weight:      Height:        Intake/Output Summary (Last 24 hours) at 11/08/2017 0801 Last data filed at 11/08/2017 0600 Gross per 24 hour  Intake 1273.6 ml  Output 1550 ml  Net -276.4 ml   Filed Weights   11/07/17 0029 11/07/17 0500 11/08/17 0222  Weight: 68.9 kg 68.9 kg 68.4 kg    Telemetry    NSR, HR in 70's to 80's with occasional PVC's.  - Personally Reviewed  ECG    NSR, HR 82, with LAD. - Personally Reviewed  Physical Exam   General: Well developed, elderly Caucasian female appearing in no acute distress. Head: Normocephalic, atraumatic.  Neck: Supple without bruits, JVD  not elevated Lungs:  Resp regular and unlabored, CTA without wheezing or rales. Heart: RRR, S1, S2, no S3, S4, or murmur; no rub. Abdomen: Soft, non-tender, non-distended with normoactive bowel sounds. No hepatomegaly. No rebound/guarding. No obvious abdominal masses. Extremities: No clubbing or cyanosis, trace lower extremity edema. Distal pedal pulses are 2+ bilaterally. Neuro: Alert and oriented X 3. Moves all extremities spontaneously. Psych: Normal affect.  Labs    Chemistry Recent Labs  Lab 11/06/17 2038 11/06/17 2103 11/07/17 0129 11/07/17 1412 11/07/17 2208  NA  --  125* 130* 134*  --   K  --  3.0*  --  3.4* 3.3*  CL  --  93*  --  103  --   CO2  --  23  --  25  --   GLUCOSE  --  116*  --  110*  --   BUN  --  13  --  7*  --   CREATININE  --  0.76  --  0.70  --   CALCIUM  --  9.9  --  8.9  --   PROT 7.7  --   --   --   --   ALBUMIN 4.4  --   --   --   --  AST 61*  --   --   --   --   ALT 25  --   --   --   --   ALKPHOS 42  --   --   --   --   BILITOT 1.1  --   --   --   --   GFRNONAA  --  >60  --  >60  --   GFRAA  --  >60  --  >60  --   ANIONGAP  --  9  --  6  --      Hematology Recent Labs  Lab 11/06/17 2103 11/07/17 1412 11/08/17 0410  WBC 4.1 4.0 5.2  RBC 2.89* 2.63* 2.66*  HGB 9.4* 8.3* 8.3*  HCT 26.5* 24.2* 24.3*  MCV 91.7 92.0 91.4  MCH 32.5 31.6 31.2  MCHC 35.5 34.3 34.2  RDW 16.1* 16.3* 15.5  PLT 290 297 281    Cardiac Enzymes Recent Labs  Lab 11/07/17 0728 11/07/17 1412 11/07/17 2204 11/08/17 0410  TROPONINI 0.48* 0.53* 0.62* 0.55*   No results for input(s): TROPIPOC in the last 168 hours.   BNP Recent Labs  Lab 11/06/17 2103  BNP 191.0*     DDimer No results for input(s): DDIMER in the last 168 hours.   Radiology    Ct Soft Tissue Neck W Contrast  Result Date: 11/06/2017 CLINICAL DATA:  Initial evaluation for acute sore throat for 6 weeks. History of throat cancer. Pulsatile mass. EXAM: CT NECK WITH CONTRAST TECHNIQUE:  Multidetector CT imaging of the neck was performed using the standard protocol following the bolus administration of intravenous contrast. CONTRAST:  38mL OMNIPAQUE IOHEXOL 300 MG/ML  SOLN COMPARISON:  None available. FINDINGS: Pharynx and larynx: Oral cavity within normal limits without discrete mass or loculated collection. No acute inflammatory changes about the dentition. Discernible or discrete base of tongue mass. Palatine tonsils appear to be absent. There is mild mucosal edema involving the oropharyngeal mucosa, more prominent on the left (series 2, image 34), which could reflect acute pharyngitis. Parapharyngeal fat relatively well maintained. Nasopharynx within normal limits. No retropharyngeal collection or effusion. Epiglottis within normal limits. Vallecula partially effaced and not well evaluated. No other discrete mass or significant inflammatory changes. True cords symmetric and within normal limits. Subglottic airway clear. Salivary glands: Chronic fatty atrophy noted within the parotid and submandibular glands bilaterally. No acute inflammatory changes or stones identified. 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 Right thyroid lobe is normal in appearance. Lymph nodes: No pathologically enlarged lymph nodes identified within the neck. Vascular: Moderate to advanced atherosclerotic change about the aortic arch and origin of the great vessels as well as the carotid bifurcations bilaterally. Right common and internal carotid arteries are medialized into the retropharyngeal space. Tortuosity of the distal left ICA closely approximates the left nasopharynx. Jugular veins patent bilaterally. Limited intracranial: Scattered vascular calcifications noted within the carotid siphons. Otherwise unremarkable. Visualized orbits: Patient status post bilateral ocular lens replacement. Globes and orbital soft tissues otherwise unremarkable. Mastoids and visualized paranasal sinuses: Few scattered  maxillary retention cyst noted bilaterally. Visualized paranasal sinuses otherwise clear. Visualize mastoids and middle ear cavities are well pneumatized and free of fluid. Skeleton: No acute osseous abnormality. No worrisome lytic or blastic osseous lesions. 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 Chronic facet mediated grade 1 anterolisthesis of C7 on T1. Upper chest: Gaseous distension of the visualized upper esophagus noted. Partially visualized upper chest demonstrates no other acute abnormality. Partially visualized lungs  are clear. Other: None. IMPRESSION: 1. Mild mucosal edema involving the oropharyngeal mucosa, primarily on the left. Finding could reflect sequelae of mild pharyngitis. Correlation with physical exam recommended. 2. No other acute inflammatory changes identified within the neck. No discrete mass or adenopathy. 3. Medialization of the right carotid arteries into the retropharyngeal space. Query this as pulsatile lesion seen on physical exam. 4. Advanced atherosclerosis. Electronically Signed   By: Jeannine Boga M.D.   On: 11/06/2017 22:19    Cardiac Studies   Echocardiogram: 11/07/2017 Study Conclusions  - Left ventricle: There was mild to moderate concentric   hypertrophy. Systolic function was normal. The estimated ejection   fraction was in the range of 60% to 65%. Wall motion was normal;   there were no regional wall motion abnormalities. Findings   consistent with left ventricular diastolic dysfunction, grade   indeterminate. Doppler parameters are consistent with high   ventricular filling pressure. - Mitral valve: Moderately to severely calcified annulus. There was   mild regurgitation. - Left atrium: The atrium was moderately to severely dilated. - Right atrium: The atrium was mildly dilated. - Inferior vena cava: The vessel was dilated. The respirophasic   diameter changes were blunted (< 50%), consistent with elevated   central venous pressure.  Estimated CVP 15 mmHg.  Patient Profile     77 y.o. female w/ PMH of HTN, HLD, carotid artery stenosis, chronic anemia, and history of throat cancer who presented to [facility] ED on 11/06/2017 for progressive weakness over the past several weeks. Found to be hyponatremic and to have an elevated troponin (peaking at 0.62). Cardiology consulted for further management.   Assessment & Plan    1. NSTEMI - presented with progressive weakness over the past 3-4 weeks but denies any recent chest pain or palpitations. Weakness is likely multifactorial in the setting of her decreased oral intake, dehydration, and chronic anemia but this could also represent an anginal equivalent as she does have multiple cardiac risk factors. - Cyclic troponin values have peaked at 0.62 and trending down to 0.55 on most recent check. Echo shows a preserved EF of 60-65%, no regional WMA, mild MR, and moderate to severe dilation of the LA. Given her multiple medical issues, medical management of her NSTEMI has been pursued. She does have a chronic anemia and Hgb has trended down slightly from 9.4 to 8.3. Would plan to continue Heparin through today then discontinue. She remains on ASA. Statin therapy recently discontinued by her PCP.  2. HTN - BP has been well-controlled at 120/54 - 158/76 within the past 24 hours. Lisinopril-HCTZ recently discontinued in the outpatient setting due to concerns for dehydration. Would not plan to add back diuretic therapy at this time given her poor PO intake. Amlodipine can be further titrated to 5mg  daily if additional BP control is needed.   3. HLD - followed by PCP. Previously on Pravastatin and Fenofibrate but these were discontinued on 10/31/2017.  4. Chronic Anemia - Hemoglobin stable at 9.4 on admission, at 8.3 this AM. Occult blood pending.   5. Hyponatremia/Dysphagia/ Unintentional Weight Loss - Na+ 125 on admission, improved to 134 on most recent check. Will add on BMET to AM  labs.   - she has lost over 20 lbs within the past month due to decreased PO intake and issues with dysphagia. Recommend Speech Therapy consult for swallow eval prior to discharge. Had been referred to ENT as an outpatient.   6. Headaches - the patient tells me  she has been having headaches since she fell recently and hit her head but is unable to quantify the timeframe of her fall. No focal neurological deficits noted on examination. No family currently at the bedside to contribute to the history surrounding her fall. Asked the patient's nurse to give PRN Tylenol now to see if this helps. If symptoms persist, may need to consider Head CT.     For questions or updates, please contact [place] Please consult [email or web address] for contact info under Cardiology/STEMI.   Signed, Erma Heritage , PA-C 8:01 AM 11/08/2017 Pager: [phone]  The patient was seen and examined, and I agree with the history, physical exam, assessment and plan as documented above.  She currently denies chest pain shortness of breath.  I reviewed the echocardiogram yesterday which demonstrated normal left ventricular systolic function and normal regional wall motion, LVEF 60 to 65%.  Her generalized weakness is multifactorial as detailed above.  We will continue medical management of non-STEMI.  I would recommend continuing heparin through today and then discontinuing.  Continue aspirin.  Statin was discontinued by PCP.  Most recent blood pressure 139/63.  Hemoglobin is down 8.3 this morning.  Sodium is up 233.  No further recommendations at this time.  [facility] will sign off.   Medication Recommendations:  As above Other recommendations (labs, testing, etc):  None Follow up as an outpatient:  With PCP.   Kate Sable, MD, [facility]  11/08/2017 11:04 AM

## 2017-11-08 NOTE — Care Management Note (Signed)
Case Management Note  Patient Details  Name: Shirley Brown MRN: [id number] Date of Birth: 01/20/1941  Subjective/Objective:     Hyponatremia/ NSTEMI. From home, daughter is staying with patient currently and plans to continue at time of DC. Patient independent, has cane for outside uses. Has PCP-still drives to appointments. No issues affording medications. Anticipate PT eval prior to DC/after finished with heparin infusion.                 Action/Plan: CM following for needs.   Expected Discharge Date:   unk               Expected Discharge Plan:     In-House Referral:     Discharge planning Services  CM Consult  Post Acute Care Choice:    Choice offered to:     DME Arranged:    DME Agency:     HH Arranged:    HH Agency:     Status of Service:  In process, will continue to follow  If discussed at Long Length of Stay Meetings, dates discussed:    Additional Comments:  Brenlee Koskela, Chauncey Reading, RN 11/08/2017, 2:17 PM

## 2017-11-08 NOTE — Progress Notes (Signed)
Patient complains of burning down throat to under ribcage. MD aware. PRN and scheduled medication have short term desire effects. Elevated troponin noted this shift. Patient continues on heparin drip levels are within therapeutic parameters. Patient denies shortness of breath breath sounds are clear over diminished bases. No other concerns voiced by patient at current time.

## 2017-11-08 NOTE — Progress Notes (Signed)
CRITICAL VALUE ALERT  Critical Value:  Troponin 0.60  Date & Time Notied:  11/08/17 @ 0017  Provider Notified: Dr. Olevia Bowens  Orders Received/Actions taken: Waiting for call back/orders

## 2017-11-09 LAB — CBC
HCT: 24.6 % — ABNORMAL LOW (ref 36.0–46.0)
HEMOGLOBIN: 8 g/dL — AB (ref 12.0–15.0)
MCH: 29.6 pg (ref 26.0–34.0)
MCHC: 32.5 g/dL (ref 30.0–36.0)
MCV: 91.1 fL (ref 80.0–100.0)
NRBC: 0 % (ref 0.0–0.2)
Platelets: 254 10*3/uL (ref 150–400)
RBC: 2.7 MIL/uL — AB (ref 3.87–5.11)
RDW: 15.6 % — ABNORMAL HIGH (ref 11.5–15.5)
WBC: 5.5 10*3/uL (ref 4.0–10.5)

## 2017-11-09 LAB — HEPARIN LEVEL (UNFRACTIONATED): Heparin Unfractionated: 0.25 IU/mL — ABNORMAL LOW (ref 0.30–0.70)

## 2017-11-09 LAB — BASIC METABOLIC PANEL
Anion gap: 6 (ref 5–15)
BUN: 6 mg/dL — ABNORMAL LOW (ref 8–23)
CHLORIDE: 99 mmol/L (ref 98–111)
CO2: 29 mmol/L (ref 22–32)
Calcium: 9 mg/dL (ref 8.9–10.3)
Creatinine, Ser: 0.67 mg/dL (ref 0.44–1.00)
Glucose, Bld: 88 mg/dL (ref 70–99)
POTASSIUM: 3.5 mmol/L (ref 3.5–5.1)
Sodium: 134 mmol/L — ABNORMAL LOW (ref 135–145)

## 2017-11-09 NOTE — Discharge Summary (Signed)
Physician Discharge Summary  Shirley Brown VEL:[id number] DOB: [birth year] DOA: 11/06/2017  PCP: Claretta Fraise, MD  Admit date: 11/06/2017 Discharge date: 11/09/2017  Time spent: 45 minutes  Recommendations for Outpatient Follow-up:  -Patient will be discharged home today. -Will need follow-up with cardiology within 2 weeks.  Discharge Diagnoses:  Principal Problem:   Hyponatremia Active Problems:   HLD (hyperlipidemia)   HTN (hypertension)   Hyperglycemia   GERD (gastroesophageal reflux disease)   Generalized anxiety disorder   Hypokalemia Non-ST elevated MI   Hypophosphatemia   Elevated AST (SGOT)   Anemia   Constipation   Skin lesion   Discharge Condition: Stable and improved  Filed Weights   11/07/17 0500 11/08/17 0222 11/09/17 0606  Weight: 68.9 kg 68.4 kg 70 kg    History of present illness:  As per Dr. Olevia Bowens on 10/14: Shirley Brown is a 77 y.o. female with medical history significant of allergy to meropenem, anemia, osteoarthritis, left tonsillar cancer, history of carotid artery occlusion, depression, hyperlipidemia, hypertension who is coming to the emergency department due to sore throat for the past 6 weeks and progressively worse weakness for the past 3 weeks or so.  The patient's son and daughter-in-law stated that she was started treatment with lisinopril-hydrochlorothiazide combination tablet about 4 weeks ago.  She subsequently had this medication discontinued about a week ago.  They also mentioned that she has had decreased appetite and significant weight loss over the past 2 to 3 months.  She mentions not being able to sleep well and having trouble falling asleep at night.  She denies depression or anxiety though.  There has not being any fever, productive cough, wheezing, chest pain, palpitations, dizziness, diaphoresis, PND or orthopnea.  The patient gets frequent lower extremity edema.  She has frequent constipation, but denies abdominal  pain, nausea, emesis, diarrhea, melena or hematochezia.  Has some frequency, but no dysuria or hematuria.  She denies polyuria, polydipsia, polyphagia or blurred vision.  Shingles has some skin lesions on right lower back/gluteal area, which she was told that was zoster.  ED Course: Vital signs temperature 97.9 F, pulse 96, respirations 20, blood pressure 157/76 mmHg and O2 sat 98% on room air.  The patient received 1 L normal saline bolus in the emergency department.  Urinalysis shows throat color, but recently was otherwise normal.  White count 4.1, hemoglobin 9.4 g/dL and platelets 290.  Sodium was 125, potassium 3.0, chloride 93 and CO2 23 mmol/L.  Renal function was normal.  Glucose 116, calcium 9.9, magnesium 1.7 and phosphorus 2.3 mg/dL.  Hospital Course:   Hyponatremia -Likely secondary to thiazide diuretics in addition to poor p.o. intake and failure to thrive. -She was started on saline which has since been discontinued. -Sodium today is 134.  Non-ST elevated MI -Has been seen by cardiology, plan for medical management. -Received IV heparin for 48 hours. -She currently does not complain of chest pain or shortness of breath. -2D echo with ejection fraction of 60 to 65% with no wall motion abnormalities, grade indeterminant left ventricular diastolic dysfunction. -Continue aspirin, statin was recently discontinued by PCP on 10/8 due to complaints of generalized weakness.  Would encourage discussions around restarting statin given her MI and plans for medical management, patient is hesitant to resume at this time without speaking with PCP first.  Anemia of chronic disease -Baseline hemoglobin is around 9, has been hovering around 8.3 this admission.  No plans for transfusion at present.  Hypertension -Blood pressure stable,  continue Norvasc.  Generalized weakness -Will arrange home health therapy.  Procedures:  As above  Consultations:  Cardiology  Discharge  Instructions  Discharge Instructions    Diet - low sodium heart healthy   Complete by:  As directed    Increase activity slowly   Complete by:  As directed      Allergies as of 11/09/2017      Reactions   Bupropion Other (See Comments)   Lips swelled   Sulfa Antibiotics Itching, Rash   Benadryl [diphenhydramine Hcl (sleep)] Other (See Comments)   Insomnia   Doxycycline Nausea And Vomiting   Dizziness   Gabapentin Other (See Comments)   Drunk feeling, staggering   Desyrel [trazodone] Other (See Comments)   Keeps me up all night      Medication List    STOP taking these medications   hydrOXYzine 25 MG capsule Commonly known as:  VISTARIL     TAKE these medications   acyclovir 400 MG tablet Commonly known as:  ZOVIRAX TAKE 1 TABLET BY MOUTH ONCE DAILY   amLODipine 2.5 MG tablet Commonly known as:  NORVASC Take 1 tablet (2.5 mg total) by mouth daily.   aspirin 81 MG tablet Take 81 mg by mouth daily.   clotrimazole 10 MG troche Commonly known as:  MYCELEX Allow one to dissolve in the mouth 5 times daily For yeast What changed:    how much to take  how to take this  when to take this  additional instructions   DULoxetine 30 MG capsule Commonly known as:  CYMBALTA Take 1 capsule (30 mg total) by mouth daily.   folic acid 1 MG tablet Commonly known as:  FOLVITE TAKE 1 TABLET BY MOUTH ONCE DAILY   linaclotide 145 MCG Caps capsule Commonly known as:  LINZESS Take 1 capsule (145 mcg total) by mouth daily. To regulate bowel movements   pantoprazole 40 MG tablet Commonly known as:  PROTONIX TAKE 1 TABLET BY MOUTH TWICE A DAY   VITAMIN B 12 PO Take 1 tablet by mouth daily.   Vitamin D3 3000 units Tabs Take 1 tablet by mouth daily.   zolpidem 10 MG tablet Commonly known as:  AMBIEN TAKE 1 TABLET BY MOUTH EVERY DAY AT BEDTIME AS NEEDED What changed:  See the new instructions.      Allergies  Allergen Reactions  . Bupropion Other (See Comments)     Lips swelled  . Sulfa Antibiotics Itching and Rash  . Benadryl [Diphenhydramine Hcl (Sleep)] Other (See Comments)    Insomnia   . Doxycycline Nausea And Vomiting    Dizziness   . Gabapentin Other (See Comments)    Drunk feeling, staggering  . Desyrel [Trazodone] Other (See Comments)    Keeps me up all night   Follow-up Information    Claretta Fraise, MD. Schedule an appointment as soon as possible for a visit in 2 week(s).   Specialty:  Family Medicine Contact information: [place] [address] [phone]        Herminio Commons, MD. Schedule an appointment as soon as possible for a visit in 2 week(s).   Specialty:  Cardiology Contact information: [place] [address] [phone]            The results of significant diagnostics from this hospitalization (including imaging, microbiology, ancillary and laboratory) are listed below for reference.    Significant Diagnostic Studies: Dg Chest 2 View  Result Date: 10/31/2017 CLINICAL DATA:  Shortness of Breath EXAM: CHEST - 2 VIEW COMPARISON:  January 20, 2012. FINDINGS: There is no edema or consolidation. Heart is mildly enlarged with pulmonary vascularity normal. No adenopathy. There is aortic atherosclerosis. There is degenerative change in the thoracic spine. IMPRESSION: Mild cardiac enlargement. No edema or consolidation. There is aortic atherosclerosis. Aortic Atherosclerosis (ICD10-I70.0). Electronically Signed   By: Lowella Grip III M.D.   On: 10/31/2017 12:04   Ct Soft Tissue Neck W Contrast  Result Date: 11/06/2017 CLINICAL DATA:  Initial evaluation for acute sore throat for 6 weeks. History of throat cancer. Pulsatile mass. EXAM: CT NECK WITH CONTRAST TECHNIQUE: Multidetector CT imaging of the neck was performed using the standard protocol following the bolus administration of intravenous contrast. CONTRAST:  27mL OMNIPAQUE IOHEXOL 300 MG/ML  SOLN COMPARISON:  None available.  FINDINGS: Pharynx and larynx: Oral cavity within normal limits without discrete mass or loculated collection. No acute inflammatory changes about the dentition. Discernible or discrete base of tongue mass. Palatine tonsils appear to be absent. There is mild mucosal edema involving the oropharyngeal mucosa, more prominent on the left (series 2, image 34), which could reflect acute pharyngitis. Parapharyngeal fat relatively well maintained. Nasopharynx within normal limits. No retropharyngeal collection or effusion. Epiglottis within normal limits. Vallecula partially effaced and not well evaluated. No other discrete mass or significant inflammatory changes. True cords symmetric and within normal limits. Subglottic airway clear. Salivary glands: Chronic fatty atrophy noted within the parotid and submandibular glands bilaterally. No acute inflammatory changes or stones identified. 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 Right thyroid lobe is normal in appearance. Lymph nodes: No pathologically enlarged lymph nodes identified within the neck. Vascular: Moderate to advanced atherosclerotic change about the aortic arch and origin of the great vessels as well as the carotid bifurcations bilaterally. Right common and internal carotid arteries are medialized into the retropharyngeal space. Tortuosity of the distal left ICA closely approximates the left nasopharynx. Jugular veins patent bilaterally. Limited intracranial: Scattered vascular calcifications noted within the carotid siphons. Otherwise unremarkable. Visualized orbits: Patient status post bilateral ocular lens replacement. Globes and orbital soft tissues otherwise unremarkable. Mastoids and visualized paranasal sinuses: Few scattered maxillary retention cyst noted bilaterally. Visualized paranasal sinuses otherwise clear. Visualize mastoids and middle ear cavities are well pneumatized and free of fluid. Skeleton: No acute osseous abnormality. No worrisome  lytic or blastic osseous lesions. 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 Chronic facet mediated grade 1 anterolisthesis of C7 on T1. Upper chest: Gaseous distension of the visualized upper esophagus noted. Partially visualized upper chest demonstrates no other acute abnormality. Partially visualized lungs are clear. Other: None. IMPRESSION: 1. Mild mucosal edema involving the oropharyngeal mucosa, primarily on the left. Finding could reflect sequelae of mild pharyngitis. Correlation with physical exam recommended. 2. No other acute inflammatory changes identified within the neck. No discrete mass or adenopathy. 3. Medialization of the right carotid arteries into the retropharyngeal space. Query this as pulsatile lesion seen on physical exam. 4. Advanced atherosclerosis. Electronically Signed   By: Jeannine Boga M.D.   On: 11/06/2017 22:19    Microbiology: Recent Results (from the past 240 hour(s))  Microscopic Examination     Status: None   Collection Time: 10/31/17 11:31 AM  Result Value Ref Range Status   WBC, UA 0-5 0 - 5 /hpf Final   RBC, UA None seen 0 - 2 /hpf Final   Epithelial Cells (non renal) 0-10 0 - 10 /hpf Final   Renal Epithel, UA  None seen None seen /hpf Final   Bacteria, UA None seen None seen/Few Final  Urine Culture     Status: None   Collection Time: 10/31/17 12:55 PM  Result Value Ref Range Status   Urine Culture, Routine Final report  Final   Organism ID, Bacteria Comment  Final    Comment: Culture shows less than 10,000 colony forming units of bacteria per milliliter of urine. This colony count is not generally considered to be clinically significant.   MRSA PCR Screening     Status: None   Collection Time: 11/07/17 12:19 AM  Result Value Ref Range Status   MRSA by PCR NEGATIVE NEGATIVE Final    Comment:        The GeneXpert MRSA Assay (FDA approved for NASAL specimens only), is one component of a comprehensive MRSA colonization surveillance  program. It is not intended to diagnose MRSA infection nor to guide or monitor treatment for MRSA infections. Performed at [facility], [address], [place], [place] [address]      Labs: Basic Metabolic Panel: Recent Labs  Lab 11/06/17 2103 11/07/17 0129 11/07/17 1412 11/07/17 2208 11/08/17 0410 11/09/17 0410  NA 125* 130* 134*  --  133* 134*  K 3.0*  --  3.4* 3.3* 3.9 3.5  CL 93*  --  103  --  101 99  CO2 23  --  25  --  24 29  GLUCOSE 116*  --  110*  --  86 88  BUN 13  --  7*  --  6* 6*  CREATININE 0.76  --  0.70  --  0.63 0.67  CALCIUM 9.9  --  8.9  --  8.8* 9.0  MG 1.7  --   --  1.8  --   --   PHOS 2.3*  --   --   --   --   --    Liver Function Tests: Recent Labs  Lab 11/06/17 2038  AST 61*  ALT 25  ALKPHOS 42  BILITOT 1.1  PROT 7.7  ALBUMIN 4.4   No results for input(s): LIPASE, AMYLASE in the last 168 hours. No results for input(s): AMMONIA in the last 168 hours. CBC: Recent Labs  Lab 11/06/17 2103 11/07/17 1412 11/08/17 0410 11/09/17 0410  WBC 4.1 4.0 5.2 5.5  NEUTROABS 2.1  --   --   --   HGB 9.4* 8.3* 8.3* 8.0*  HCT 26.5* 24.2* 24.3* 24.6*  MCV 91.7 92.0 91.4 91.1  PLT 290 297 281 254   Cardiac Enzymes: Recent Labs  Lab 11/07/17 0245 11/07/17 0728 11/07/17 1412 11/07/17 2204 11/08/17 0410  TROPONINI 0.57* 0.48* 0.53* 0.62* 0.55*   BNP: BNP (last 3 results) Recent Labs    10/11/17 1615 11/06/17 2103  BNP 145.2* 191.0*    ProBNP (last 3 results) No results for input(s): PROBNP in the last 8760 hours.  CBG: No results for input(s): GLUCAP in the last 168 hours.     Signed:  Lelon Frohlich  [facility] Pager: [phone] 11/09/2017, 11:12 AM

## 2017-11-09 NOTE — Progress Notes (Signed)
IV removed, patient tolerated well. Removed foley catheter, patient tolerated well.  Reviewed AVS with patient who verbalized understanding.  Patient awaiting daughter's arrival to transport home.  Patient to be discharged home once able to void post foley removal.

## 2017-11-10 DIAGNOSIS — R1084 Generalized abdominal pain: Secondary | ICD-10-CM | POA: Diagnosis not present

## 2017-11-10 DIAGNOSIS — Z85818 Personal history of malignant neoplasm of other sites of lip, oral cavity, and pharynx: Secondary | ICD-10-CM | POA: Diagnosis not present

## 2017-11-10 DIAGNOSIS — K219 Gastro-esophageal reflux disease without esophagitis: Secondary | ICD-10-CM | POA: Diagnosis not present

## 2017-11-10 DIAGNOSIS — E876 Hypokalemia: Secondary | ICD-10-CM | POA: Diagnosis not present

## 2017-11-10 DIAGNOSIS — R464 Slowness and poor responsiveness: Secondary | ICD-10-CM | POA: Diagnosis not present

## 2017-11-10 DIAGNOSIS — Z96653 Presence of artificial knee joint, bilateral: Secondary | ICD-10-CM | POA: Diagnosis not present

## 2017-11-10 DIAGNOSIS — I10 Essential (primary) hypertension: Secondary | ICD-10-CM | POA: Diagnosis not present

## 2017-11-10 DIAGNOSIS — K5900 Constipation, unspecified: Secondary | ICD-10-CM | POA: Diagnosis not present

## 2017-11-10 DIAGNOSIS — R350 Frequency of micturition: Secondary | ICD-10-CM | POA: Diagnosis not present

## 2017-11-10 DIAGNOSIS — R339 Retention of urine, unspecified: Secondary | ICD-10-CM | POA: Diagnosis not present

## 2017-11-10 DIAGNOSIS — Z8739 Personal history of other diseases of the musculoskeletal system and connective tissue: Secondary | ICD-10-CM | POA: Diagnosis not present

## 2017-11-10 DIAGNOSIS — E785 Hyperlipidemia, unspecified: Secondary | ICD-10-CM | POA: Diagnosis not present

## 2017-11-10 DIAGNOSIS — Z87891 Personal history of nicotine dependence: Secondary | ICD-10-CM | POA: Diagnosis not present

## 2017-11-10 DIAGNOSIS — R531 Weakness: Secondary | ICD-10-CM | POA: Diagnosis not present

## 2017-11-10 DIAGNOSIS — N390 Urinary tract infection, site not specified: Secondary | ICD-10-CM | POA: Diagnosis not present

## 2017-11-10 DIAGNOSIS — R631 Polydipsia: Secondary | ICD-10-CM | POA: Diagnosis not present

## 2017-11-11 DIAGNOSIS — N390 Urinary tract infection, site not specified: Secondary | ICD-10-CM | POA: Diagnosis not present

## 2017-11-13 ENCOUNTER — Telehealth: Payer: Self-pay | Admitting: Family Medicine

## 2017-11-13 ENCOUNTER — Other Ambulatory Visit: Payer: Self-pay | Admitting: *Deleted

## 2017-11-13 NOTE — Patient Outreach (Addendum)
[facility]) Care Management  11/13/2017  Tiwanda Plaster [place] Lathe 07/11/1940 [id number]   EMMI-general discharge  RED ON EMMI ALERT Day # 1 Date: 11/11/17 1046 Red Alert Reason: 1)  know who to call about changes in condition? No 2) New prescriptions? I don't know  Transition of care services noted to be completed by primary care MD office staff- [facility]   Outreach attempt # 1 Patient is able to verify HIPAA [facility] Care Management RN reviewed and addressed red alert with patient  Mrs Faughnan states her answer to the first red alert question was correct She did not know who to call when she began to have concerns with urinary retention since her discharge and the removal of the foley catheter placed in the hospital. She reports not voiding after discharge. Her daughter, April made attempts to call back to the hospital unsuccessful to reach a RN or MD and states they could not find numbers on the discharge instructions to call the hospital. They report they went to an emergency clinic in [place], was given medicine, Nitrofurantoin/macrodantin, and she voided on her own without needing to be catheterized  [facility] RN CM encouraged April to find the hospital discharge instructions.  CM read through the instructions indicating the two MDs to follow up with would be Dr Livia Snellen for all medical concerns other than cardiac issues. CM indicated Dr Bronson Ing as the heart MD/cardiologist. CM discussed the numbers listed on the discharge instruction plus provided an action plan with steps to call [facility] RN CM, 24 hour nurse line, primary care giver office or EMS to include contact numbers for each.  CM also encouraged them to place copy of these numbers on the refrigerator or a place for quick access.    For the second red alert question Mrs Brede confirmed the answer should have been No vs I don't know. She confirms with [facility] RN CM she was not given new prescriptions  from the hospital but only informed to stop taking her vistaril  On today Mrs Calame complained of continuous weakness, decreased ambulation and nervousness She reports she went to the hospital for these same s/s and because she could not walk. She reports her legs feel heavy but denies pain or edema of the legs CM inquired about home health services for [id number] PT but she states she is walking around her home and did not think she needed HH PT. CM discussed the possible benefits and how her primary MD could be consulted for orders prn. She was not d/c home with [facility]   Cm assessed possible medication side effects from new medicine, macrodantin. Mrs Oblinger and April states Mrs Hoots has s/s before starting macrodantin.  Cm reviewed her labs listed in Epic for her hgb and noted her hgb was 9.4, 8.3 an 8 during her hospitalization.  When CM discussed this with her and April they state they do not feel this is an issue.  She is on folic acid and April reports vitamin B12 shots at intervals  Mrs Steinmetz and April agree to allow CM to call primary MD office to report her concerns   Fall Mrs Scism states she believes some of her issues are related to a fall without injury but with hitting of her head prior to last hospitalization. She had a headache and the cardiologist at the hospital revaluated her for these s/s. Cm did not find a head CT result An ECHO was completed showing ejection fraction  of 60 to 65% with no wall motion abnormalities, grade indeterminant left ventricular diastolic dysfunction   Social: Mrs Lanahan is a divorced female living at home with caregiver and daughter, April now staying with her. Prior to the last hospitalization she was living alone.  Her last inpatient admission was 11/06/17 to 11/09/17 She reports on today that she remains weak after leaving the hospital and her stay in ICU. She reports a hx of anemia with vitamin B12 shots at times. She reports she and her daughter has issues  with anemia. She confirms she has a good appetite. She reports being able to feed, dress herself and walk through her home. She and her daughter states she is too weak to go out of the home. She reports needing assist from her daughter to get in an out of the tub for baths. Her daughter is assisting with cooking, home tasks/errands and getting her to medical appointments. Mrs Hitchman had reported she was able to drive to her own medical appointments prior to recent admissions.    Conditions: Hyponatremia, Hyperlipidemia, HTN, dysphagia, hyperglycemia, GERD, generalized anxiety disorder, hypokalemia, No ST elevated MI, hypophosphatemia, anemia, constipation, skin lesions from shingles on right lower back/gluteal areas, cervical spondylosis, allergy to meropenem, osteoarthritis, left tonsillar/throat cancer, hx carotid artery occlusion, depression, former smoker, episodes of dysphagia and had lost over 10 pounds within the past few weeks prior to hospitalization   Medications: denies concerns with taking medications as prescribed, affording medications, side effects of medications and questions about medications  DME cane, walker, eyeglasses  Appointments: Dr Livia Snellen Primary MD seen 10/31/17 and to be seen 11/22/17  Encouraged April to call and make an appointment with Dr Bronson Ing   Advance Directives: does not have living will or POA and is not voicing interest in getting completed   Consent: [facility] RN CM reviewed [facility] services with patient. Patient gave verbal consent for services. Advised patient that there will be further automated EMMI- post discharge calls to assess how the patient is doing following the recent hospitalization Advised the patient that another call may be received from a nurse if any of their responses were abnormal. Patient voiced understanding and was appreciative of f/u call.  Plan:  [facility] RN will collaborate with primary MD and call patient prn for update  [facility] RN CM gave  April,daughter numbers for Dr Livia Snellen, Dr Bronson Ing, [facility] RN CM and 24 hour nurse line with action plan for any increase s/s    [facility] RN CM called her primary MD office to see if pt could be seen early, to discuss continued weakness and nervousness with low hgb levels, her going to [place] [facility] for urinary retention and Cardiologist evaluation from hospital wanting to encourage discussions around restarting statin given her MI because the patient is hesitant to resume at this time without speaking with PCP first.. [facility] RN CM spoke with Caryl Pina and left a message for MD/RN to include CM number  Routed this note to primary MD and Cardiologist   Joelene Millin L. Lavina Hamman, RN, BSN, [place] Coordinator Office number [phone] Mobile number [phone]  Main [facility] number [phone] Fax number [phone]

## 2017-11-13 NOTE — Telephone Encounter (Signed)
Called and spoke with Stanford Scotland.  RN wanted to make aware of symptoms.  No available appt until 10/30

## 2017-11-13 NOTE — Patient Outreach (Signed)
[facility]) Care Management  11/13/2017  Shirley Brown Sep 01, 1940 [id number]   Care coordination  [facility] RN CM received a call from Dr Livia Snellen RN, Mingo Amber to discuss possible earlier appoint for pt, [facility] PT, Fall reported by pt prior to hospitalization, continued weakness and nervousness with low hgb levels, her going to [place] [facility] for urinary retention and Cardiologist evaluation from hospital wanting to encourage discussionsaround restarting statin given her MI because the patient is hesitant to resume at this time without speaking with MD  These concerns will be discussed with MD per RN  [facility] RN CM will follow up with Shirley Brown within 4 business days to see how she is doing and to see if other identifiable needs prior to case closure    Kimberly L. Lavina Hamman, RN, BSN, [place] Coordinator Office number [phone] Mobile number [phone]  Main [facility] number [phone] Fax number [phone]

## 2017-11-15 ENCOUNTER — Other Ambulatory Visit: Payer: Self-pay | Admitting: *Deleted

## 2017-11-15 ENCOUNTER — Telehealth: Payer: Self-pay | Admitting: Cardiovascular Disease

## 2017-11-15 NOTE — Telephone Encounter (Signed)
Received phone call from pt's daughter April as pt is too weak to talk on the phone.  Pt was d/ced from [facility] s/p NSTEMI/HTN/anemia and told to be scheduled in f/u within 2 weeks.  Daughter reports pt is extremely weak and needs to be seen in follow-up sooner rather than later.  She was seen by Dr Bronson Ing during her hospital stay but per April was told pt can not be seen by him until 12/28/17.  BP 119/73 HR 113 today.  Daughter is concerned d/t extreme weakness and that she maybe dehydrated.  Scheduled pt to be seen by Dr Domenic Polite Friday 11/17/17 at 9:40 am.  Daughter is aware of date/time/location/provider for the appointment.

## 2017-11-15 NOTE — Telephone Encounter (Signed)
New Message         Patient will be a new patient with "Shirley Brown" and at this point she is weak, no strength and her daughter is concerned.

## 2017-11-15 NOTE — Patient Outreach (Signed)
[facility]) Care Management  11/15/2017  Shirley Brown 03-14-1940 [id number]   EMMI-general discharge/follow up from previous [place] [facility] # 4 Date: 11/14/17 1048 Red Alert Reason: Lost interest in things? Yes  Insurance: united [employer] medicare   Transition of care services noted to be completed by primary care MD office staff- [facility]  Outreach # 2 successful to home number  Patient is able to verify HIPAA [facility] Care Management RN reviewed and addressed the new red alertwith patient plus followed up on her progress related to previous CM interventions. She given CM permission to speak with her daughter Shirley Brown during the call   Today Shirley Brown, Shirley Brown's daughter answered and states she has continued to stay with the patient since discharge and will do so as long as needed. [facility] RN CM thanked Shirley Brown for becoming Shirley Brown Caregiver  With speaking with Shirley Brown she reports still being in bed, continuing to be "not too good". "weak" with "pain in her head" and again she relates these s/s to a fall before her last admission. She is taking medications and rest to attempt to resolve her s/s  She report she has not received any calls from her primary MD office. CM updated her on [facility] RN CM's contact with Shirley Shirley Aly RN on 11/13/17. CM encouraged her to call her MD office to report these s/s and to go to the nearest ED if the issues worsen. CM encouraged her not to disregard her s/s but Shirley Brown insists she can await f/u appointments next week with MDs   Today Shirley Brown informs CM she has seen once and is to see neurosurgeon, Shirley Brown on 11/21/17. She reports on the first visit to Shirley Brown he discussed surgery, sent her to the pain management clinic in the office for a shot.  Shirley Brown report she did not feel the shot was helpful. She confirms with CM that Shirley Brown is not aware of her recent fall in  September.  Cm encouraged her to call the office to report any s/s and issues when they occur in order to see what her MD recommends vs disregarding or waiting for appointments weeks and months later.    Recent Fall- Shirley Brown further clarifies that she "can't remember falling but I had to have passed out and hit my head because my head was sore later." CM assessed her to clarify that she fell unwitnessed in her home during September 2019 hitting her head and did not discuss this with anyone until she was in the emergency room on 11/06/17.  She again states this is the only fall she recalls having in the last 6-12 months [facility] RN CM spoke with Shirley Brown and Shirley Brown about the importance of reporting unwitnessed falls, getting examined, fall prevention and possible need of a medical alert services for fall safety. Shirley Brown states she is not able to afford a medical alert services but Shirley Brown states she will assist to get one if possible    Red alert related to lost of interest  Shirley Brown informs CM the EMMI system began to  repeat questions over and over and she became confused with the questioning. Shirley Brown confirms today that she "don't have a lost of interest but I am not able to do a lot because I don't have any energy" She reports she has done some housework but is appreciative of Shirley Brown staying with  her to help and is not sure how long that will be. Later in the call she states she has issues with anxiety, nervousness and insomnia    Social- Shirley Brown informs Cm she is disabled related to mental and medical issues. She shares that she suffers from depression, mania and anxiety and is seeing a psychiatrist but is willing to stay and care for her mother as long as she can. Shirley Brown shares that mental health s/s is a family concern to include her brother, mother and father Shirley Brown shares that Shirley Brown has been noted since she has been staying in the home to be very nervous during the day and not sleeping well at  all during the night and gets a minimal of a few hours of sleep even with use of Cymbalta and Ambien.  She states Benadryl has adverse affect on Shirley Brown and causes her to be "wired" Melantonin has also been tried without success.  Shirley Brown is concern this is related to anxiety and states that her mother is disregarding this part of her health. CM spoke with Shirley Brown about this and she agrees to allow [name] to speak with her about this and her lost of interest in things   Conditions: Hyponatremia, Hyperlipidemia, HTN, dysphagia, hyperglycemia, GERD, generalized anxiety disorder, hypokalemia, No ST elevated MI, hypophosphatemia, anemia, constipation, skin lesions from shingles on right lower back/gluteal areas, cervical spondylosis, allergy to meropenem, osteoarthritis, left tonsillar/throat cancer, hx carotid artery occlusion, depression, former smoker, episodes of dysphagiaand had lost over 10 pounds within the past few weeks prior to hospitalization   Appointments:  Shirley Brown, neuro surgeon to be seen on 11/21/17  Shirley Brown Primary MD seen 10/31/17 and to be seen 11/22/17  They still have not called to make an appointment with Shirley Brown  [facility] RN encouraged Shirley Brown to call and make an appointment with Shirley Brown today CM discussed the importance of Shirley Brown follow up appointments to these providers related to her continued s/s Shirley Brown discussed with [facility] RN CM her concern of getting to these appointment related to a her son and daughter in law having a disagreement after they informed her they would take her to the appointments in [place].  Shirley Brown reports concerns with not feeling comfortable with driving in [place] traffic. She had not asked Shirley Brown to drive her but did so during the call. Shirley Brown agrees to drive her mother to her appointment in [place] and to contact [facility] RN CM if something changes   Consent: [facility] RN CM reviewed [facility] services with patient. Patient gave verbal  consent for services [place] patient that there will be further automated EMMI-post discharge calls to assess how the patient is doing following the recent hospitalization Advised the patient that another call may be received from a nurse if any of their responses were abnormal. Patient voiced understanding and was appreciative of f/u call.  Plan:  [facility] RN CM to follow up with Shirley Barbato after her appointments next week and plan for case closure   [facility] RN will refer Shirley Macari to [facility] SW for assistance with medical alert services, counseling for anxiety/insomnia/lost of interest in things, and alternate transportation services when family are not available to provide  Routed this note to primary MD, Neurosurgeon and [place]. Lavina Hamman, RN, BSN, [place] Coordinator Office number [phone] Mobile number [phone]  Main [facility] number [phone] Fax  number [phone]

## 2017-11-16 ENCOUNTER — Other Ambulatory Visit (HOSPITAL_COMMUNITY)
Admission: RE | Admit: 2017-11-16 | Discharge: 2017-11-16 | Disposition: A | Discharge status: Home / Self Care | Payer: Medicare Other | Source: Ambulatory Visit | Attending: Student | Admitting: Student

## 2017-11-16 ENCOUNTER — Encounter: Payer: Self-pay | Admitting: Student

## 2017-11-16 ENCOUNTER — Ambulatory Visit (INDEPENDENT_AMBULATORY_CARE_PROVIDER_SITE_OTHER): Payer: Medicare Other | Admitting: Student

## 2017-11-16 VITALS — BP 122/64 | HR 95 | Ht 60.0 in | Wt 158.8 lb

## 2017-11-16 DIAGNOSIS — E871 Hypo-osmolality and hyponatremia: Secondary | ICD-10-CM | POA: Diagnosis not present

## 2017-11-16 DIAGNOSIS — I214 Non-ST elevation (NSTEMI) myocardial infarction: Secondary | ICD-10-CM | POA: Diagnosis not present

## 2017-11-16 DIAGNOSIS — Z79899 Other long term (current) drug therapy: Secondary | ICD-10-CM | POA: Insufficient documentation

## 2017-11-16 DIAGNOSIS — R1310 Dysphagia, unspecified: Secondary | ICD-10-CM | POA: Diagnosis not present

## 2017-11-16 DIAGNOSIS — E785 Hyperlipidemia, unspecified: Secondary | ICD-10-CM

## 2017-11-16 DIAGNOSIS — R519 Headache, unspecified: Secondary | ICD-10-CM

## 2017-11-16 DIAGNOSIS — D649 Anemia, unspecified: Secondary | ICD-10-CM | POA: Insufficient documentation

## 2017-11-16 DIAGNOSIS — R51 Headache: Secondary | ICD-10-CM

## 2017-11-16 LAB — CBC WITH DIFFERENTIAL/PLATELET
ABS IMMATURE GRANULOCYTES: 0.02 10*3/uL (ref 0.00–0.07)
Basophils Absolute: 0 10*3/uL (ref 0.0–0.1)
Basophils Relative: 1 %
Eosinophils Absolute: 0.2 10*3/uL (ref 0.0–0.5)
Eosinophils Relative: 3 %
HEMATOCRIT: 29.4 % — AB (ref 36.0–46.0)
HEMOGLOBIN: 9.5 g/dL — AB (ref 12.0–15.0)
Immature Granulocytes: 0 %
LYMPHS PCT: 21 %
Lymphs Abs: 1.4 10*3/uL (ref 0.7–4.0)
MCH: 28.6 pg (ref 26.0–34.0)
MCHC: 32.3 g/dL (ref 30.0–36.0)
MCV: 88.6 fL (ref 80.0–100.0)
MONO ABS: 1.1 10*3/uL — AB (ref 0.1–1.0)
MONOS PCT: 17 %
NEUTROS ABS: 4 10*3/uL (ref 1.7–7.7)
Neutrophils Relative %: 58 %
Platelets: 363 10*3/uL (ref 150–400)
RBC: 3.32 MIL/uL — ABNORMAL LOW (ref 3.87–5.11)
RDW: 16 % — ABNORMAL HIGH (ref 11.5–15.5)
WBC: 6.8 10*3/uL (ref 4.0–10.5)
nRBC: 0 % (ref 0.0–0.2)

## 2017-11-16 LAB — BASIC METABOLIC PANEL
Anion gap: 10 (ref 5–15)
BUN: 7 mg/dL — AB (ref 8–23)
CO2: 24 mmol/L (ref 22–32)
Calcium: 10 mg/dL (ref 8.9–10.3)
Chloride: 97 mmol/L — ABNORMAL LOW (ref 98–111)
Creatinine, Ser: 0.67 mg/dL (ref 0.44–1.00)
GFR calc Af Amer: >60 mL/min (ref >60)
GLUCOSE: 118 mg/dL — AB (ref 70–99)
POTASSIUM: 3 mmol/L — AB (ref 3.5–5.1)
Sodium: 131 mmol/L — ABNORMAL LOW (ref 135–145)

## 2017-11-16 MED ORDER — PRAVASTATIN SODIUM 10 MG PO TABS: 10.0000 mg | ORAL_TABLET | Freq: Every day | ORAL | 3 refills | Status: DC

## 2017-11-16 NOTE — Telephone Encounter (Signed)
Pt has apt today with B.Strader PA-C

## 2017-11-16 NOTE — Progress Notes (Signed)
Cardiology Office Note    Date:  11/16/2017   ID:  Shirley Brown, Shirley Brown 05-03-1940, MRN [id number]  PCP:  Claretta Fraise, MD  Cardiologist: Kate Sable, MD    Chief Complaint  Patient presents with  . Hospitalization Follow-up    History of Present Illness:    Shirley Brown is a 77 y.o. female with past medical history of HTN, HLD, carotid artery stenosis, chronic anemia, and history of throat cancer who presents to the office today for hospital follow-up.   She was recently admitted to [facility] from 10/14 - 11/09/2017 for evaluation of worsening fatigue and weakness over the past several weeks in the setting of a decreased appetite, dysphagia, and an unintentional 20 lb weight loss. CT Neck showed mucosal edema thought to be secondary to mild pharyangitis with no significant abnormalities. Was found to be hyponatremia with Na+ 125 at the time of admission and initial troponin was elevated to 0.39. Was therefore admitted for further management. Her troponin values peaked at 0.62 and an echocardiogram was obtained and showed a preserved EF of 60-65% with no regional WMA. Given her multiple medical issues, medical management was recommended and she was continued on Heparin for 48 hours and started on ASA 81mg  daily. Statin therapy had previously been discontinued by her PCP and was not restarted during admission.   Her daughter called the office on 11/15/2017 reporting she had persistent weakness and was concerned for dehydration, therefore her follow-up visit was moved up to today.    In talking with the patient and her daughter today, she reports that her respiratory status has overall been at baseline since hospital discharge. She denies any recent dyspnea on exertion, orthopnea, PND, or lower extremity edema. No recent chest pain or palpitations. She does report having occasional "gas bubbles" along her sternal which presents with eating but she has overall been  consuming a small amount of food due to dysphagia. Her daughter believes she has significantly reduced her intake due to pain when consuming food but denies any associated symptoms with liquids. Has follow-up with GI scheduled but this is not until 01/2018. Does report persistent fatigue and weakness which has been present for the past month.   She was experiencing dysuria following hospital discharge and was evaluated at a local ED in [place], [place] and was started on antibiotic therapy for a UTI. Was also noted to be hypokalemic and was given a 5-day course of potassium supplementation but has completed this.   Past Medical History:  Diagnosis Date  . Allergy to meropenem   . Anemia   . Arthritis   . Cancer ([place])    left tonsillar  . Carotid artery occlusion 05/11/09   Bruit - Right  . Depression 2011  . Hyperlipidemia   . Hypertension     Past Surgical History:  Procedure Laterality Date  . APPENDECTOMY    . CHOLECYSTECTOMY    . JOINT REPLACEMENT Right 1995     Knee  . JOINT REPLACEMENT Left 2001   knee  . THROAT SURGERY Left    tonsil cancer-Left    6 1/2 weeks Radiation    Current Medications: Outpatient Medications Prior to Visit  Medication Sig Dispense Refill  . acyclovir (ZOVIRAX) 400 MG tablet TAKE 1 TABLET BY MOUTH ONCE DAILY (Patient taking differently: Take 400 mg by mouth daily. ) 30 tablet 2  . amLODipine (NORVASC) 2.5 MG tablet Take 1 tablet (2.5 mg total) by mouth daily. 30 tablet 2  .  aspirin 81 MG tablet Take 81 mg by mouth daily.    . Cholecalciferol (VITAMIN D3) 3000 UNITS TABS Take 1 tablet by mouth daily.     . Cyanocobalamin (VITAMIN B 12 PO) Take 1 tablet by mouth daily.     . DULoxetine (CYMBALTA) 30 MG capsule Take 1 capsule (30 mg total) by mouth daily. 30 capsule 1  . folic acid (FOLVITE) 1 MG tablet TAKE 1 TABLET BY MOUTH ONCE DAILY (Patient taking differently: Take 1 mg by mouth daily. ) 90 tablet 1  . linaclotide (LINZESS) 145 MCG CAPS capsule Take  1 capsule (145 mcg total) by mouth daily. To regulate bowel movements 30 capsule 5  . pantoprazole (PROTONIX) 40 MG tablet TAKE 1 TABLET BY MOUTH TWICE A DAY (Patient taking differently: Take 40 mg by mouth 2 (two) times daily. ) 180 tablet 1  . zolpidem (AMBIEN) 10 MG tablet TAKE 1 TABLET BY MOUTH EVERY DAY AT BEDTIME AS NEEDED (Patient taking differently: Take 10 mg by mouth at bedtime. ) 30 tablet 2  . clotrimazole (MYCELEX) 10 MG troche Allow one to dissolve in the mouth 5 times daily For yeast (Patient taking differently: Take 10 mg by mouth 5 (five) times daily. For yeast) 35 tablet 2   No facility-administered medications prior to visit.      Allergies:   Bupropion; Sulfa antibiotics; Benadryl [diphenhydramine hcl (sleep)]; Doxycycline; Gabapentin; and Desyrel [trazodone]   Social History   Socioeconomic History  . Marital status: Divorced    Spouse name: Not on file  . Number of children: Not on file  . Years of education: Not on file  . Highest education level: Not on file  Occupational History  . Not on file  Social Needs  . Financial resource strain: Not on file  . Food insecurity:    Worry: Not on file    Inability: Not on file  . Transportation needs:    Medical: Not on file    Non-medical: Not on file  Tobacco Use  . Smoking status: Former Smoker    Types: Cigarettes    Last attempt to quit: 01/24/1993    Years since quitting: 24.8  . Smokeless tobacco: Never Used  Substance and Sexual Activity  . Alcohol use: No  . Drug use: No  . Sexual activity: Not on file  Lifestyle  . Physical activity:    Days per week: Not on file    Minutes per session: Not on file  . Stress: Not on file  Relationships  . Social connections:    Talks on phone: Not on file    Gets together: Not on file    Attends religious service: Not on file    Active member of club or organization: Not on file    Attends meetings of clubs or organizations: Not on file    Relationship status:  Not on file  Other Topics Concern  . Not on file  Social History Narrative  . Not on file     Family History:  The patient's family history includes Cancer in her brother; Diabetes in her son; Heart attack in her father; Heart disease in her father and sister; Hyperlipidemia in her father, sister, and son; Hypertension in her father and sister; Other in her sister.   Review of Systems:   Please see the history of present illness.     General:  No chills, fever, night sweats or weight changes.  Cardiovascular:  No chest pain, dyspnea on exertion,  edema, orthopnea, palpitations, paroxysmal nocturnal dyspnea. Dermatological: No rash, lesions/masses Respiratory: No cough, dyspnea Urologic: No hematuria, Positive for dysuria (improved).  Abdominal:   No nausea, vomiting, diarrhea, bright red blood per rectum, melena, or hematemesis. Positive for solid food dysphagia.  Neurologic:  No visual changes, wkns, changes in mental status.  All other systems reviewed and are otherwise negative except as noted above.   Physical Exam:    VS:  BP 122/64   Pulse 95   Ht 5' (1.524 m)   Wt 158 lb 12.8 oz (72 kg)   SpO2 98%   BMI 31.01 kg/m    General: Well developed, well nourished elderly Caucasian female appearing in no acute distress. Head: Normocephalic, atraumatic, sclera non-icteric, no xanthomas, nares are without discharge.  Neck: No carotid bruits. JVD not elevated.  Lungs: Respirations regular and unlabored, without wheezes or rales.  Heart: Regular rate and rhythm. No S3 or S4.  No murmur, no rubs, or gallops appreciated. Abdomen: Soft, non-tender, non-distended with normoactive bowel sounds. No hepatomegaly. No rebound/guarding. No obvious abdominal masses. Msk:  Strength and tone appear normal for age. No joint deformities or effusions. Extremities: No clubbing or cyanosis. Trace ankle edema.  Distal pedal pulses are 2+ bilaterally. Neuro: Alert and oriented X 3. Moves all  extremities spontaneously. No focal deficits noted. Psych:  Responds to questions appropriately with a normal affect. Skin: No rashes or lesions noted  Wt Readings from Last 3 Encounters:  11/16/17 158 lb 12.8 oz (72 kg)  11/09/17 154 lb 5.2 oz (70 kg)  10/31/17 153 lb 6.4 oz (69.6 kg)     Studies/Labs Reviewed:   EKG:  EKG is not ordered today.   Recent Labs: 11/06/2017: ALT 25; B Natriuretic Peptide 191.0 11/07/2017: Magnesium 1.8 11/16/2017: BUN 7; Creatinine, Ser 0.67; Hemoglobin 9.5; Platelets 363; Potassium 3.0; Sodium 131   Lipid Panel    Component Value Date/Time   CHOL 157 04/03/2017 1037   CHOL 136 07/30/2012 1523   TRIG 123 04/03/2017 1037   TRIG 87 04/18/2013 1325   TRIG 75 07/30/2012 1523   HDL 55 04/03/2017 1037   HDL 59 04/18/2013 1325   HDL 57 07/30/2012 1523   CHOLHDL 2.9 04/03/2017 1037   LDLCALC 77 04/03/2017 1037   LDLCALC 86 04/18/2013 1325   LDLCALC 64 07/30/2012 1523    Additional studies/ records that were reviewed today include:   Echocardiogram: 11/07/2017 Study Conclusions  - Left ventricle: There was mild to moderate concentric   hypertrophy. Systolic function was normal. The estimated ejection   fraction was in the range of 60% to 65%. Wall motion was normal;   there were no regional wall motion abnormalities. Findings   consistent with left ventricular diastolic dysfunction, grade   indeterminate. Doppler parameters are consistent with high   ventricular filling pressure. - Mitral valve: Moderately to severely calcified annulus. There was   mild regurgitation. - Left atrium: The atrium was moderately to severely dilated. - Right atrium: The atrium was mildly dilated. - Inferior vena cava: The vessel was dilated. The respirophasic   diameter changes were blunted (< 50%), consistent with elevated   central venous pressure. Estimated CVP 15 mmHg.   Assessment:    1. Non-ST elevation myocardial infarction (NSTEMI), subsequent  episode of care ([place])   2. Hyperlipidemia LDL goal <70   3. Anemia, unspecified type   4. Hyponatremia   5. Dysphagia, unspecified type   6. Frequent headaches   7. Medication management  Plan:   In order of problems listed above:  1. Subsequent Episode of Care for NSTEMI - recently admitted for evaluation of decreased appetite, dysphagia, and an unintentional 20 lb weight loss and was found to have elevated troponin values (peaked at 0.62). Echo showed a preserved EF of 60-65% with no regional WMA. Given her multiple medical issues, medical management was recommended and she was continued on Heparin for 48 hours and further ischemic evaluation was not pursued. - she denies any recent chest pain or dyspnea on exertion. Echo and the reason for medical management was reviewed with the patient and her daughter again today. Was informed to make [place] aware if she develops any anginal symptoms.  - continue ASA. Will restart statin therapy as outlined below.   2. HLD -This has been followed by her PCP in the past. Pravastatin was recently discontinued and we reviewed the indications for restarting at this time. She is in agreement, therefore restart Pravastatin 10 mg daily and plan for repeat FLP and LFT's in 3 months.   3. Anemia - Hgb had declined to 8.0 at the time of hospital discharge. She denies any evidence of active bleeding. Will recheck CBC today.   4. Hyponatremia/ Hypokalemia - had improved at the time of recent discharge as Na+ was 134 and K+ 3.5.  Reports being diagnosed with hypokalemia in the interim but has finished potassium supplementation. Will recheck BMET today.  5. Solid Food Dysphagia - This has been an issue for the patient over the past 4 weeks and she has experienced a 20+ pound unintentional weight loss. She had been referred to GI for "maintenance follow-up" in 01/2018 by her report but will touch base with [facility] to see if she can be evaluated prior to  this given her progressive symptoms.  Remains on PPI therapy. Reviewed they should try Ensure supplementation in the interim for improved nutritional benefit.    6. Headaches - This has been a persistent issue for the patient as well which has been followed by her PCP. She does report falling approximately 4 weeks ago and is unsure if she hit her head at that time. No focal neurological deficits noted on examination. She is followed by Neurology and has scheduled follow-up next week. Would defer further imaging to her visit.   Medication Adjustments/Labs and Tests Ordered: Current medicines are reviewed at length with the patient today.  Concerns regarding medicines are outlined above.  Medication changes, Labs and Tests ordered today are listed in the Patient Instructions below. Patient Instructions  Medication Instructions:  Your physician recommends that you continue on your current medications as directed. Please refer to the Current Medication list given to you today.  If you need a refill on your cardiac medications before your next appointment, please call your pharmacy.   Lab work: Your physician recommends that you continue on your current medications as directed. Please refer to the Current Medication list given to you today.  If you have labs (blood work) drawn today and your tests are completely normal, you will receive your results only by: Marland Kitchen MyChart Message (if you have MyChart) OR . A paper copy in the mail If you have any lab test that is abnormal or we need to change your treatment, we will call you to review the results.  Testing/Procedures: NONE   Follow-Up: At [facility], you and your health needs are our priority.  As part of our continuing mission to provide you with exceptional heart  care, we have created designated Provider Care Teams.  These Care Teams include your primary Cardiologist (physician) and Advanced Practice Providers (APPs -  Physician Assistants and  Nurse Practitioners) who all work together to provide you with the care you need, when you need it. You will need a follow up appointment in 3 months.  Please call our office 2 months in advance to schedule this appointment.  You may see Kate Sable, MD or one of the following Advanced Practice Providers on your designated Care Team:   Bernerd Pho, PA-C [facility]) . Ermalinda Barrios, PA-C ([place])  Any Other Special Instructions Will Be Listed Below (If Applicable). Thank you for choosing [facility]!      Signed, Erma Heritage, PA-C  11/16/2017 5:29 PM    [facility] [address] [place], [place] [address] Phone: [phone]

## 2017-11-16 NOTE — Patient Instructions (Signed)
Medication Instructions:  Your physician recommends that you continue on your current medications as directed. Please refer to the Current Medication list given to you today.  If you need a refill on your cardiac medications before your next appointment, please call your pharmacy.   Lab work: Your physician recommends that you continue on your current medications as directed. Please refer to the Current Medication list given to you today.  If you have labs (blood work) drawn today and your tests are completely normal, you will receive your results only by: Marland Kitchen MyChart Message (if you have MyChart) OR . A paper copy in the mail If you have any lab test that is abnormal or we need to change your treatment, we will call you to review the results.  Testing/Procedures: NONE   Follow-Up: At [facility], you and your health needs are our priority.  As part of our continuing mission to provide you with exceptional heart care, we have created designated Provider Care Teams.  These Care Teams include your primary Cardiologist (physician) and Advanced Practice Providers (APPs -  Physician Assistants and Nurse Practitioners) who all work together to provide you with the care you need, when you need it. You will need a follow up appointment in 3 months.  Please call our office 2 months in advance to schedule this appointment.  You may see Kate Sable, MD or one of the following Advanced Practice Providers on your designated Care Team:   Bernerd Pho, PA-C [facility]) . Ermalinda Barrios, PA-C ([place])  Any Other Special Instructions Will Be Listed Below (If Applicable). Thank you for choosing [facility]!

## 2017-11-17 ENCOUNTER — Encounter: Payer: Self-pay | Admitting: *Deleted

## 2017-11-17 ENCOUNTER — Ambulatory Visit: Payer: Self-pay | Admitting: Cardiology

## 2017-11-17 ENCOUNTER — Other Ambulatory Visit: Payer: Self-pay | Admitting: *Deleted

## 2017-11-17 NOTE — Patient Outreach (Signed)
[facility]) Care Brown  11/17/2017  Shirley Brown Staples 02/20/1940 [id number]   CSW was able to make initial contact with patient today to perform phone assessment, as well as assess and assist with social work needs and services.  CSW introduced self, explained role and types of services provided through Shirley Brown ([place] Brown).  CSW further explained to patient that CSW works with patient's Shirley Brown, also with Shirley Brown, Shirley Brown. CSW then explained the reason for the call, indicating that Shirley Brown thought that patient would benefit from social work services and resources to assist with counseling and supportive services to help reduce symptoms of anxiety, insomnia and loss of interest in activities.  Shirley Brown also felt that patient would benefit from assistance with obtaining transportation services to and from her physician appointments, as well as providing patient resources for a medical alert system.  CSW obtained two HIPAA compliant identifiers from patient, which included patient's name and date of birth.  Patient reported that she is not interested in receiving counseling and supportive services, of any kind, at this time, becoming somewhat irritated with CSW for mentioning the fact that CSW is aware that patient is experiencing anxiety, insomnia and a loss of interest in activities.  Patient was adamant about the fact that these are areas that she is able to work on independently, feeling that she already has all the necessary resources to do so.  CSW voiced understanding, requesting that patient contact CSW directly, if patient is interested in receiving counseling services, through CSW or an outside agency, within the near future.  CSW was able to ensure that patient has the correct contact information for CSW.  Patient admitted that she may be interested in receiving transportation resources, to get to  and from her physician appointments, but only when her daughter, Shirley Brown is not available.  Patient was also interested in receiving information about medical alert systems to install in her home to ensure safety.  CSW agreed to refer patient to Shirley Brown, Social Work Social worker, also with Scientist, clinical (histocompatibility and immunogenetics), to assist patient with arranging transportation, as well as providing patient resources for medical alert systems.  Patient is aware that she will be receiving a call from Shirley Brown within the next 10 business days and to be listening out for her call.  Patient gave CSW verbal consent to speak directly with Shirley Brown, as she is currently in charge of handling all of patient's affairs.  Shirley Brown is currently residing with patient in her home, providing 24 hour care and supervision.  Patient did not wish to speak with CSW about having CSW arrange for in-home care services, or assisting patient with long-term care placement arrangements.  Patient is adamant about remaining in her own home, reporting that she is able to perform all activities of daily living independently.  Patient admitted that she tries to stay in bed if her daughter is not in the home, trying to prevent future falls.  CSW will perform a case closure on patient, as all goals of treatment have been met from social work standpoint and no additional social work needs have been identified at this time.  CSW will notify patient's Shirley Brown with Nixa Brown, Shirley Brown of CSW's plans to close patient's case.  CSW will also notify Shirley Brown of CSW's case closure plans.  CSW will fax an update to patient's Primary Care Physician,  Shirley Brown to ensure that they are aware of CSW's involvement with patient's plan of care.    Shirley Brown  Licensed Education officer, [facility]  Mailing  Trilla [address], [place], [place] [address] Physical Address-[address], [place], [place] [address] Toll Free Main # [phone] Fax # [phone] Cell # [phone]  Office # [phone] [email or web address]

## 2017-11-20 ENCOUNTER — Telehealth: Payer: Self-pay | Admitting: *Deleted

## 2017-11-20 MED ORDER — POTASSIUM CHLORIDE CRYS ER 20 MEQ PO TBCR: 20.0000 meq | EXTENDED_RELEASE_TABLET | Freq: Every day | ORAL | 3 refills | Status: DC

## 2017-11-20 NOTE — Telephone Encounter (Signed)
-----   Message from Erma Heritage, [place] sent at 11/16/2017  4:43 PM EDT ----- Please let the patient know that her hemoglobin has trended upwards from 8.0 at the time of hospital discharge to 9.5 today. Platelet count within normal limits. Kidney function has also remained stable. Does have a low-sodium level which has been chronic since 09/2016. Would recommend she limit fluid intake to less than 2 L per day. If already consuming less than 2 L, reduce to 1.5L per day. She is hypokalemic and reported previously being on K+ supplementation but completed this course. Would recommend restarting at 20 mEq daily and repeat a BMET in 1 week (can be obtained here or at PCP's office as she has an upcoming visit already scheduled for next week). Please forward a copy of labs to Claretta Fraise, MD. Thank you.

## 2017-11-21 ENCOUNTER — Other Ambulatory Visit: Payer: Self-pay

## 2017-11-21 NOTE — Patient Outreach (Signed)
[facility]) Care Management  11/21/2017  Shirley Brown [birth year] [id number]  Successful outreach to the patient on today's date. BSW introduced self to the patient and the reason for today's call, indicating this BSW received a referral to assist with transportation resources and obtaining a life alert. The patient requested this BSW speak with her daughter Shirley who is not currently at home. The patient gave BSW to contact Shirley Brown on her cell. BSW placed a call to Mrs. Brown. Unfortunately, today's call was unsuccessful as the phone continued to ring without a voice mailbox established.  Plan: BSW to mail an unsuccessful outreach letter. BSW to attempt contact within the next four business days.  Daneen Schick, BSW, CDP [facility] [phone]

## 2017-11-22 ENCOUNTER — Ambulatory Visit (INDEPENDENT_AMBULATORY_CARE_PROVIDER_SITE_OTHER): Payer: Medicare Other | Admitting: *Deleted

## 2017-11-22 ENCOUNTER — Encounter: Payer: Self-pay | Admitting: *Deleted

## 2017-11-22 ENCOUNTER — Encounter: Payer: Self-pay | Admitting: Family Medicine

## 2017-11-22 ENCOUNTER — Ambulatory Visit (INDEPENDENT_AMBULATORY_CARE_PROVIDER_SITE_OTHER): Payer: Medicare Other | Admitting: Family Medicine

## 2017-11-22 VITALS — BP 149/64 | HR 68 | Ht 60.0 in | Wt 150.0 lb

## 2017-11-22 VITALS — BP 144/75 | HR 91 | Temp 97.3°F | Ht 60.0 in | Wt 151.0 lb

## 2017-11-22 DIAGNOSIS — R1310 Dysphagia, unspecified: Secondary | ICD-10-CM

## 2017-11-22 DIAGNOSIS — D649 Anemia, unspecified: Secondary | ICD-10-CM

## 2017-11-22 DIAGNOSIS — E876 Hypokalemia: Secondary | ICD-10-CM | POA: Diagnosis not present

## 2017-11-22 DIAGNOSIS — R7401 Elevation of levels of liver transaminase levels: Secondary | ICD-10-CM

## 2017-11-22 DIAGNOSIS — E871 Hypo-osmolality and hyponatremia: Secondary | ICD-10-CM | POA: Diagnosis not present

## 2017-11-22 DIAGNOSIS — I10 Essential (primary) hypertension: Secondary | ICD-10-CM

## 2017-11-22 DIAGNOSIS — Z87440 Personal history of urinary (tract) infections: Secondary | ICD-10-CM

## 2017-11-22 DIAGNOSIS — Z0001 Encounter for general adult medical examination with abnormal findings: Secondary | ICD-10-CM | POA: Diagnosis not present

## 2017-11-22 DIAGNOSIS — Z0000 Encounter for general adult medical examination without abnormal findings: Secondary | ICD-10-CM

## 2017-11-22 DIAGNOSIS — R740 Nonspecific elevation of levels of transaminase and lactic acid dehydrogenase [LDH]: Secondary | ICD-10-CM | POA: Diagnosis not present

## 2017-11-22 DIAGNOSIS — Z23 Encounter for immunization: Secondary | ICD-10-CM

## 2017-11-22 DIAGNOSIS — Z780 Asymptomatic menopausal state: Secondary | ICD-10-CM

## 2017-11-22 LAB — MICROSCOPIC EXAMINATION

## 2017-11-22 LAB — URINALYSIS, COMPLETE
Bilirubin, UA: NEGATIVE
Glucose, UA: NEGATIVE
Nitrite, UA: NEGATIVE
PH UA: 6 (ref 5.0–7.5)
Protein, UA: NEGATIVE
RBC, UA: NEGATIVE
Urobilinogen, Ur: 1 mg/dL (ref 0.2–1.0)

## 2017-11-22 NOTE — Progress Notes (Signed)
Subjective:  Patient ID: Shirley Brown, female    DOB: 1940/04/19  Age: 77 y.o. MRN: [id number]  CC: Medical Management of Chronic Issues   HPI [name] presents for follow-up of her recent hospitalization.  She had a low sodium and was apparently hypovolemic.  She was taken off of her fluid medicines and is here today for recheck of the hypo-natremia.  She is also taking potassium since that dropped during her hospital stay.  This is in spite of the fact of being off of all kaliuretic medicines.  She is still very weak.  She is on Table walking times.  Her daughter is with her and has+ concerns about why she is so badly off balance and weak.  She says that it went back to me and the patient fell and hit her head in July.  Chart review from her hospital stay reveals that a urine osmolality was performed.  It was quite low. Serum osmolality was 273 the normal range at the lab was 2 75-2 95.  Serum sodium was 125 when the patient was admitted.  PT. Struggling to swallow solids. She has to eat soft, liquid foods. Painful dysphagia at the bases of the neck.  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   She has a past surgical history that includes Cholecystectomy; Joint replacement (Right, 1995); Joint replacement (Left, 2001); Appendectomy; and Throat surgery  (Left).   Her family history includes Cancer in her brother; Diabetes in her son; Heart attack in her father; Heart disease in her father and sister; Hyperlipidemia in her father, sister, and son; Hypertension in her father and sister; Other in her sister.She reports that she quit smoking about 24 years ago. Her smoking use included cigarettes. She has never used smokeless tobacco. She reports that she does not drink alcohol or use drugs.    ROS Review of Systems  Constitutional: Positive for appetite change (very poor).  HENT: Negative for congestion.   Eyes: Negative for visual disturbance.  Respiratory: Negative for chest tightness and shortness of breath.   Cardiovascular: Negative for chest pain, palpitations and leg swelling.  Gastrointestinal: Negative for abdominal pain, constipation, diarrhea, nausea and vomiting.  Genitourinary: Negative for difficulty urinating.  Musculoskeletal: Negative for arthralgias and myalgias.  Neurological: Positive for dizziness and weakness. Negative for headaches.  Psychiatric/Behavioral: Positive for decreased concentration. Negative for sleep disturbance.    Objective:  BP (!) 144/75   Pulse 91   Temp (!) 97.3 F (36.3 C) (Oral)   Ht 5' (1.524 m)   Wt 151 lb (68.5 kg)   BMI 29.49 kg/m   BP Readings from Last 3 Encounters:  11/22/17 (!) 144/75  11/16/17 122/64  11/09/17 135/60    Wt Readings from Last 3 Encounters:  11/22/17 150 lb (  68 kg)  11/22/17 151 lb (68.5 kg)  11/16/17 158 lb 12.8 oz (72 kg)     Physical Exam  Constitutional: She is oriented to person, place, and time. She appears well-developed and well-nourished. No distress.  HENT:  Head: Normocephalic and atraumatic.  Right Ear: External ear normal.  Left Ear: External ear normal.  Nose: Nose normal.  Mouth/Throat: Oropharynx is clear and moist.  Eyes: Pupils are equal, round, and reactive to light. Conjunctivae and EOM are normal.  Neck: Normal range of motion.  Neck supple. No thyromegaly present.  Cardiovascular: Normal rate, regular rhythm and normal heart sounds.  No murmur heard. Pulmonary/Chest: Effort normal and breath sounds normal. No respiratory distress. She has no wheezes. She has no rales.  Abdominal: Soft. Bowel sounds are normal. She exhibits no distension. There is no tenderness.  Musculoskeletal: Normal range of motion. She exhibits no edema.  Lymphadenopathy:    She has no cervical adenopathy.  Neurological: She is alert and oriented to person, place, and time. She has normal reflexes.  Skin: Skin is warm and dry.  Psychiatric: Her behavior is normal. Thought content normal.      Assessment & Plan:   Loeta was seen today for medical management of chronic issues.  Diagnoses and all orders for this visit:  Hyponatremia -     CBC with Differential/Platelet -     Ferritin -     Iron  Hypokalemia -     Lipid panel -     Osmolality  Hypophosphatemia  Elevated AST (SGOT)  Anemia, unspecified type  Essential hypertension -     CMP14+EGFR  Recent urinary tract infection -     Urinalysis, Complete  Dysphagia, unspecified type -     Ambulatory referral to ENT -     Ambulatory referral to Gastroenterology  Other orders -     Microscopic Examination       I am having Damita P. Baysinger maintain her Cyanocobalamin (VITAMIN B 12 PO), Vitamin D3, aspirin, acyclovir, pantoprazole, zolpidem, linaclotide, amLODipine, DULoxetine, folic acid, pravastatin, and potassium chloride SA.  Allergies as of 11/22/2017      Reactions   Bupropion Other (See Comments)   Lips swelled   Sulfa Antibiotics Itching, Rash   Benadryl [diphenhydramine Hcl (sleep)] Other (See Comments)   Insomnia   Doxycycline Nausea And Vomiting   Dizziness   Gabapentin Other (See Comments)   Drunk feeling, staggering   Desyrel [trazodone] Other (See Comments)   Keeps me up all night      Medication List        Accurate as of 11/22/17  6:21  PM. Always use your most recent med list.          acyclovir 400 MG tablet Commonly known as:  ZOVIRAX TAKE 1 TABLET BY MOUTH ONCE DAILY   amLODipine 2.5 MG tablet Commonly known as:  NORVASC Take 1 tablet (2.5 mg total) by mouth daily.   aspirin 81 MG tablet Take 81 mg by mouth daily.   DULoxetine 30 MG capsule Commonly known as:  CYMBALTA Take 1 capsule (30 mg total) by mouth daily.   folic acid 1 MG tablet Commonly known as:  FOLVITE TAKE 1 TABLET BY MOUTH ONCE DAILY   linaclotide 145 MCG Caps capsule Commonly known as:  LINZESS Take 1 capsule (145 mcg total) by mouth daily. To regulate bowel movements   pantoprazole 40 MG tablet Commonly known as:  PROTONIX TAKE 1 TABLET BY MOUTH TWICE A DAY  potassium chloride SA 20 MEQ tablet Commonly known as:  K-DUR,KLOR-CON Take 1 tablet (20 mEq total) by mouth daily.   pravastatin 10 MG tablet Commonly known as:  PRAVACHOL Take 1 tablet (10 mg total) by mouth daily.   VITAMIN B 12 PO Take 1 tablet by mouth daily.   Vitamin D3 3000 units Tabs Take 1 tablet by mouth daily.   zolpidem 10 MG tablet Commonly known as:  AMBIEN TAKE 1 TABLET BY MOUTH EVERY DAY AT BEDTIME AS NEEDED      Will remeasure sodium and osmolality today.  If they remain in the hypovolemic hyponatremic range patient will need to have a cortrosyn stim test and an a.m. cortisol.  Follow-up: Return in about 2 weeks (around 2020-05-1417).  Claretta Fraise, M.D.

## 2017-11-22 NOTE — Patient Instructions (Signed)
Please work on your goal of having 3 meals per day.  Good options to include- any fruits or vegetables that you like, yogurt, peanut butter, Ensure or boost shakes, whole wheat bread, brown rice, oatmeal, lean proteins- chicken, [place], fish  Please review the information on Advance Directives, and if you complete the paperwork please bring a copy to our office to be filed in your medical record.   You can try Debrox for ear wax build up.   Follow up with Dr. Livia Snellen as scheduled.  Thank you for coming in for your Annual Wellness Visit today!!   Preventive Care 76 Years and Older, Female Preventive care refers to lifestyle choices and visits with your health care provider that can promote health and wellness. What does preventive care include?  A yearly physical exam. This is also called an annual well check.  Dental exams once or twice a year.  Routine eye exams. Ask your health care provider how often you should have your eyes checked.  Personal lifestyle choices, including: ? Daily care of your teeth and gums. ? Regular physical activity. ? Eating a healthy diet. ? Avoiding tobacco and drug use. ? Limiting alcohol use. ? Practicing safe sex. ? Taking low-dose aspirin every day. ? Taking vitamin and mineral supplements as recommended by your health care provider. What happens during an annual well check? The services and screenings done by your health care provider during your annual well check will depend on your age, overall health, lifestyle risk factors, and family history of disease. Counseling Your health care provider may ask you questions about your:  Alcohol use.  Tobacco use.  Drug use.  Emotional well-being.  Home and relationship well-being.  Sexual activity.  Eating habits.  History of falls.  Memory and ability to understand (cognition).  Work and work Statistician.  Reproductive health.  Screening You may have the following tests or  measurements:  Height, weight, and BMI.  Blood pressure.  Lipid and cholesterol levels. These may be checked every 5 years, or more frequently if you are over 68 years old.  Skin check.  Lung cancer screening. You may have this screening every year starting at age 43 if you have a 30-pack-year history of smoking and currently smoke or have quit within the past 15 years.  Fecal occult blood test (FOBT) of the stool. You may have this test every year starting at age 24.  Flexible sigmoidoscopy or colonoscopy. You may have a sigmoidoscopy every 5 years or a colonoscopy every 10 years starting at age 61.  Hepatitis C blood test.  Hepatitis B blood test.  Sexually transmitted disease (STD) testing.  Diabetes screening. This is done by checking your blood sugar (glucose) after you have not eaten for a while (fasting). You may have this done every 1-3 years.  Bone density scan. This is done to screen for osteoporosis. You may have this done starting at age 40.  Mammogram. This may be done every 1-2 years. Talk to your health care provider about how often you should have regular mammograms.  Talk with your health care provider about your test results, treatment options, and if necessary, the need for more tests. Vaccines Your health care provider may recommend certain vaccines, such as:  Influenza vaccine. This is recommended every year.  Tetanus, diphtheria, and acellular pertussis (Tdap, Td) vaccine. You may need a Td booster every 10 years.  Varicella vaccine. You may need this if you have not been vaccinated.  Zoster vaccine.  You may need this after age 42.  Measles, mumps, and rubella (MMR) vaccine. You may need at least one dose of MMR if you were born in 1957 or later. You may also need a second dose.  Pneumococcal 13-valent conjugate (PCV13) vaccine. One dose is recommended after age 65.  Pneumococcal polysaccharide (PPSV23) vaccine. One dose is recommended after age  7.  Meningococcal vaccine. You may need this if you have certain conditions.  Hepatitis A vaccine. You may need this if you have certain conditions or if you travel or work in places where you may be exposed to hepatitis A.  Hepatitis B vaccine. You may need this if you have certain conditions or if you travel or work in places where you may be exposed to hepatitis B.  Haemophilus influenzae type b (Hib) vaccine. You may need this if you have certain conditions.  Talk to your health care provider about which screenings and vaccines you need and how often you need them. This information is not intended to replace advice given to you by your health care provider. Make sure you discuss any questions you have with your health care provider. Document Released: 02/06/2015 Document Revised: 09/30/2015 Document Reviewed: 11/11/2014 Elsevier Interactive Patient Education  2018 [place] in the Home Falls can cause injuries. They can happen to people of all ages. There are many things you can do to make your home safe and to help prevent falls. What can I do on the outside of my home?  Regularly fix the edges of walkways and driveways and fix any cracks.  Remove anything that might make you trip as you walk through a door, such as a raised step or threshold.  Trim any bushes or trees on the path to your home.  Use bright outdoor lighting.  Clear any walking paths of anything that might make someone trip, such as rocks or tools.  Regularly check to see if handrails are loose or broken. Make sure that both sides of any steps have handrails.  Any raised decks and porches should have guardrails on the edges.  Have any leaves, snow, or ice cleared regularly.  Use sand or salt on walking paths during winter.  Clean up any spills in your garage right away. This includes oil or grease spills. What can I do in the bathroom?  Use night lights.  Install grab bars by the  toilet and in the tub and shower. Do not use towel bars as grab bars.  Use non-skid mats or decals in the tub or shower.  If you need to sit down in the shower, use a plastic, non-slip stool.  Keep the floor dry. Clean up any water that spills on the floor as soon as it happens.  Remove soap buildup in the tub or shower regularly.  Attach bath mats securely with double-sided non-slip rug tape.  Do not have throw rugs and other things on the floor that can make you trip. What can I do in the bedroom?  Use night lights.  Make sure that you have a light by your bed that is easy to reach.  Do not use any sheets or blankets that are too big for your bed. They should not hang down onto the floor.  Have a firm chair that has side arms. You can use this for support while you get dressed.  Do not have throw rugs and other things on the floor that can make you trip. What  can I do in the kitchen?  Clean up any spills right away.  Avoid walking on wet floors.  Keep items that you use a lot in easy-to-reach places.  If you need to reach something above you, use a strong step stool that has a grab bar.  Keep electrical cords out of the way.  Do not use floor polish or wax that makes floors slippery. If you must use wax, use non-skid floor wax.  Do not have throw rugs and other things on the floor that can make you trip. What can I do with my stairs?  Do not leave any items on the stairs.  Make sure that there are handrails on both sides of the stairs and use them. Fix handrails that are broken or loose. Make sure that handrails are as long as the stairways.  Check any carpeting to make sure that it is firmly attached to the stairs. Fix any carpet that is loose or worn.  Avoid having throw rugs at the top or bottom of the stairs. If you do have throw rugs, attach them to the floor with carpet tape.  Make sure that you have a light switch at the top of the stairs and the bottom of  the stairs. If you do not have them, ask someone to add them for you. What else can I do to help prevent falls?  Wear shoes that: ? Do not have high heels. ? Have rubber bottoms. ? Are comfortable and fit you well. ? Are closed at the toe. Do not wear sandals.  If you use a stepladder: ? Make sure that it is fully opened. Do not climb a closed stepladder. ? Make sure that both sides of the stepladder are locked into place. ? Ask someone to hold it for you, if possible.  Clearly mark and make sure that you can see: ? Any grab bars or handrails. ? First and last steps. ? Where the edge of each step is.  Use tools that help you move around (mobility aids) if they are needed. These include: ? Canes. ? Walkers. ? Scooters. ? Crutches.  Turn on the lights when you go into a dark area. Replace any light bulbs as soon as they burn out.  Set up your furniture so you have a clear path. Avoid moving your furniture around.  If any of your floors are uneven, fix them.  If there are any pets around you, be aware of where they are.  Review your medicines with your doctor. Some medicines can make you feel dizzy. This can increase your chance of falling. Ask your doctor what other things that you can do to help prevent falls. This information is not intended to replace advice given to you by your health care provider. Make sure you discuss any questions you have with your health care provider. Document Released: 11/06/2008 Document Revised: 06/18/2015 Document Reviewed: 02/14/2014 Elsevier Interactive Patient Education  [employer].

## 2017-11-23 ENCOUNTER — Other Ambulatory Visit: Payer: Self-pay

## 2017-11-23 ENCOUNTER — Encounter: Payer: Self-pay | Admitting: *Deleted

## 2017-11-23 ENCOUNTER — Ambulatory Visit: Payer: Self-pay | Admitting: *Deleted

## 2017-11-23 LAB — CBC WITH DIFFERENTIAL/PLATELET
BASOS: 1 %
Basophils Absolute: 0.1 10*3/uL (ref 0.0–0.2)
EOS (ABSOLUTE): 0.2 10*3/uL (ref 0.0–0.4)
EOS: 3 %
HEMATOCRIT: 28.1 % — AB (ref 34.0–46.6)
HEMOGLOBIN: 9.3 g/dL — AB (ref 11.1–15.9)
IMMATURE GRANS (ABS): 0 10*3/uL (ref 0.0–0.1)
Immature Granulocytes: 0 %
LYMPHS: 20 %
Lymphocytes Absolute: 1.4 10*3/uL (ref 0.7–3.1)
MCH: 29.2 pg (ref 26.6–33.0)
MCHC: 33.1 g/dL (ref 31.5–35.7)
MCV: 88 fL (ref 79–97)
MONOCYTES: 11 %
Monocytes Absolute: 0.8 10*3/uL (ref 0.1–0.9)
NEUTROS ABS: 4.6 10*3/uL (ref 1.4–7.0)
Neutrophils: 65 %
PLATELETS: 350 10*3/uL (ref 150–450)
RBC: 3.19 x10E6/uL — ABNORMAL LOW (ref 3.77–5.28)
RDW: 15 % (ref 12.3–15.4)
WBC: 7 10*3/uL (ref 3.4–10.8)

## 2017-11-23 LAB — LIPID PANEL
CHOL/HDL RATIO: 2.5 ratio (ref 0.0–4.4)
CHOLESTEROL TOTAL: 167 mg/dL (ref 100–199)
HDL: 66 mg/dL (ref >39)
LDL Calculated: 84 mg/dL (ref 0–99)
Triglycerides: 85 mg/dL (ref 0–149)
VLDL Cholesterol Cal: 17 mg/dL (ref 5–40)

## 2017-11-23 LAB — CMP14+EGFR
ALT: 22 IU/L (ref 0–32)
AST: 44 IU/L — ABNORMAL HIGH (ref 0–40)
Albumin/Globulin Ratio: 1.9 (ref 1.2–2.2)
Albumin: 4.3 g/dL (ref 3.5–4.8)
Alkaline Phosphatase: 58 IU/L (ref 39–117)
BILIRUBIN TOTAL: 0.9 mg/dL (ref 0.0–1.2)
BUN / CREAT RATIO: 18 (ref 12–28)
BUN: 13 mg/dL (ref 8–27)
CO2: 22 mmol/L (ref 20–29)
Calcium: 9.8 mg/dL (ref 8.7–10.3)
Chloride: 95 mmol/L — ABNORMAL LOW (ref 96–106)
Creatinine, Ser: 0.71 mg/dL (ref 0.57–1.00)
GFR calc non Af Amer: 83 mL/min/{1.73_m2} (ref >59)
GFR, EST AFRICAN AMERICAN: 96 mL/min/{1.73_m2} (ref >59)
GLUCOSE: 104 mg/dL — AB (ref 65–99)
Globulin, Total: 2.3 g/dL (ref 1.5–4.5)
POTASSIUM: 3.4 mmol/L — AB (ref 3.5–5.2)
SODIUM: 135 mmol/L (ref 134–144)
TOTAL PROTEIN: 6.6 g/dL (ref 6.0–8.5)

## 2017-11-23 LAB — IRON: IRON: 43 ug/dL (ref 27–139)

## 2017-11-23 LAB — OSMOLALITY: Osmolality Meas: 277 mOsmol/kg — ABNORMAL LOW (ref 280–301)

## 2017-11-23 LAB — FERRITIN: Ferritin: 176 ng/mL — ABNORMAL HIGH (ref 15–150)

## 2017-11-23 NOTE — Patient Outreach (Signed)
[facility]) Care Management  11/23/2017  Shirley Brown [birth year] [id number]  Successful outreach to the patient on today's date, HIPAA identifiers confirmed. BSW discussed inability to contact the patients daughter to discuss transportation resources. The patient reports her daughter is currently transporting to medical appointments but she is "not sure how long that will last". The patient requests BSW speak to daughter but is able to confirm current payor as [employer]. BSW briefly educated the patient on there transportation benefit provided by her health plan. The patient requested information mailed to her. BSW to mail the patient a print out explaining how to access this service.  Plan: BSW to outreach the patient in the next 3 weeks to confirm receipt of mailing.  Daneen Schick, BSW, CDP [facility] [phone]

## 2017-11-23 NOTE — Progress Notes (Signed)
Subjective:   Shirley Brown is a 77 y.o. female who presents for an Initial Medicare Annual Wellness Visit.  Shirley Brown is accompanied today by her daughter.  She worked at [employer] for many years until she had to go out of work on [employer] disability in 2000 after having both knees replaced.  She enjoys going to church and coloring.  Shirley Brown has 2 children and 1 grandchild  Her daughter is currently living with her, as she has had recent health problems.  She went to the ER earlier this month, and was admitted to the hospital with an MI.  She has also experienced several falls this year.  She feels her health this year has declined from last year due to her recent health problems.  She reports no surgeries int he past year.    Review of Systems     Neurologic- headache  Other systems negative         Objective:    Today's Vitals   11/22/17 1432  BP: (!) 149/64  Pulse: 68  Weight: 150 lb (68 kg)  Height: 5' (1.524 m)  PainSc: 3   PainLoc: Head   Body mass index is 29.29 kg/m.  Advanced Directives 11/22/2017 11/17/2017 11/13/2017 11/07/2017 11/06/2017 04/08/2014  Does Patient Have a Medical Advance Directive? No No No No No No  Would patient like information on creating a medical advance directive? Yes (MAU/Ambulatory/Procedural Areas - Information given) No - Patient declined No - Patient declined No - Patient declined - Yes - Educational materials given    Current Medications (verified) Outpatient Encounter Medications as of 11/22/2017  Medication Sig  . acyclovir (ZOVIRAX) 400 MG tablet TAKE 1 TABLET BY MOUTH ONCE DAILY (Patient taking differently: Take 400 mg by mouth daily. )  . amLODipine (NORVASC) 2.5 MG tablet Take 1 tablet (2.5 mg total) by mouth daily.  Marland Kitchen aspirin 81 MG tablet Take 81 mg by mouth daily.  . Cholecalciferol (VITAMIN D3) 3000 UNITS TABS Take 1 tablet by mouth daily.   . Cyanocobalamin (VITAMIN B 12 PO) Take 1 tablet by mouth daily.   . DULoxetine  (CYMBALTA) 30 MG capsule Take 1 capsule (30 mg total) by mouth daily.  . folic acid (FOLVITE) 1 MG tablet TAKE 1 TABLET BY MOUTH ONCE DAILY (Patient taking differently: Take 1 mg by mouth daily. )  . linaclotide (LINZESS) 145 MCG CAPS capsule Take 1 capsule (145 mcg total) by mouth daily. To regulate bowel movements (Patient not taking: Reported on 11/22/2017)  . pantoprazole (PROTONIX) 40 MG tablet TAKE 1 TABLET BY MOUTH TWICE A DAY (Patient taking differently: Take 40 mg by mouth 2 (two) times daily. )  . potassium chloride SA (K-DUR,KLOR-CON) 20 MEQ tablet Take 1 tablet (20 mEq total) by mouth daily.  . pravastatin (PRAVACHOL) 10 MG tablet Take 1 tablet (10 mg total) by mouth daily.  Marland Kitchen zolpidem (AMBIEN) 10 MG tablet TAKE 1 TABLET BY MOUTH EVERY DAY AT BEDTIME AS NEEDED (Patient taking differently: Take 10 mg by mouth at bedtime. )   No facility-administered encounter medications on file as of 11/22/2017.     Allergies (verified) Bupropion; Sulfa antibiotics; Benadryl [diphenhydramine hcl (sleep)]; Doxycycline; Gabapentin; and Desyrel [trazodone]   History: Past Medical History:  Diagnosis Date  . Allergy to meropenem   . Anemia   . Arthritis   . 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 Carotid artery occlusion 05/11/09   Bruit - Right  . Depression 2011  .  Hyperlipidemia   . Hypertension    Past Surgical History:  Procedure Laterality Date  . APPENDECTOMY    . CHOLECYSTECTOMY    . JOINT REPLACEMENT Right 1995     Knee  . JOINT REPLACEMENT Left 2001   knee  . THROAT SURGERY Left    tonsil cancer-Left    6 1/2 weeks Radiation   Family History  Problem Relation Age of Onset  . Heart disease Father   . Heart attack Father   . Hyperlipidemia Father   . Hypertension Father   . Cancer Brother        Throat and Lung  . Diabetes Son   . Hyperlipidemia Son   . Heart disease Sister        before age 75  . Hyperlipidemia Sister   . Hypertension Sister   . Other Sister         varicose veins   Social History   Socioeconomic History  . Marital status: Divorced    Spouse name: Not on file  . Number of children: Not on file  . Years of education: Not on file  . Highest education level: Not on file  Occupational History  . Not on file  Social Needs  . Financial resource strain: Not on file  . Food insecurity:    Worry: Not on file    Inability: Not on file  . Transportation needs:    Medical: Not on file    Non-medical: Not on file  Tobacco Use  . Smoking status: Former Smoker    Types: Cigarettes    Last attempt to quit: 01/24/1993    Years since quitting: 24.8  . Smokeless tobacco: Never Used  Substance and Sexual Activity  . Alcohol use: No  . Drug use: No  . Sexual activity: Not on file  Lifestyle  . Physical activity:    Days per week: Not on file    Minutes per session: Not on file  . Stress: Not on file  Relationships  . Social connections:    Talks on phone: Not on file    Gets together: Not on file    Attends religious service: Not on file    Active member of club or organization: Not on file    Attends meetings of clubs or organizations: Not on file    Relationship status: Not on file  Other Topics Concern  . Not on file  Social History Narrative  . Not on file    Tobacco Counseling Counseling given: No   Clinical Intake:     Pain Score: 3   Headache                 Activities of Daily Living In your present state of health, do you have any difficulty performing the following activities: 11/22/2017 11/17/2017  Hearing? N N  Vision? N N  Difficulty concentrating or making decisions? Y N  Comment Problems with concentrating, remembering, and making decisions -  Walking or climbing stairs? Y Y  Comment Uses walker, avoids stairs -  Dressing or bathing? Tempie Donning  Comment Since falling and getting out of hospital  -  Doing errands, shopping? Y Y  Comment Family takes her to appointments  -  Preparing Food and  eating ? N N  Using the Toilet? N N  In the past six months, have you accidently leaked urine? N Y  Do you have problems with loss of bowel control? N N  Managing your  Medications? N N  Managing your Finances? N N  Housekeeping or managing your Housekeeping? Tempie Donning  Comment daughter helps with house work -  Some recent data might be hidden     Immunizations and Health Maintenance Immunization History  Administered Date(s) Administered  . Influenza, High Dose Seasonal PF 10/17/2013, 10/19/2016, 11/22/2017  . Influenza,inj,Quad PF,6+ Mos 11/07/2012, 11/12/2014  . Influenza-Unspecified 11/02/2015  . Pneumococcal Conjugate-13 10/13/2014  . Pneumococcal Polysaccharide-23 10/29/2009   Health Maintenance Due  Topic Date Due  . DEXA SCAN  03/19/2016   Patient states she will have at next office visit, future order entered.   Patient Care Team: Claretta Fraise, MD as PCP - General (Family Medicine) Herminio Commons, MD as PCP - Cardiology (Cardiology) Gala Romney Cristopher Estimable, MD as Consulting Physician (Gastroenterology) Barbaraann Faster, RN as [place] Management Ashok Pall, MD as Consulting Physician (Neurosurgery) Daneen Schick as [place] Management     Assessment:   This is a routine wellness examination for Shirley Brown.  Hearing/Vision screen No hearing deficit noted. Sees well with glasses- goes to [facility] for eye exams  Dietary issues and exercise activities discussed:    Patient states she usually eats 2 meals per day.  Cereal or oatmeal for breakfast and various proteins and vegetables for dinner.  Her daughter tries to have hersupplement with protein shakes if she is not hungry.  Recommended continue to supplement with Ensure or Boost if not hungry, also advised regular or greek yogurt and peanut butter and whole grain bread as good snacks.     Goals    . Have 3 meals a day     Have 3 balanced meals per day.         Depression Screen PHQ 2/9 Scores 11/17/2017 11/15/2017 11/13/2017 10/31/2017 10/11/2017 06/07/2017 05/22/2017  PHQ - 2 Score 2 2 1 2 4 1 4   PHQ- 9 Score 5 6 - 5 12 - 9  Exception Documentation - - - - - - -    Fall Risk Fall Risk  11/22/2017 11/22/2017 11/17/2017 11/13/2017 10/31/2017  Falls in the past year? Yes Yes Yes Yes No  Number falls in past yr: 2 or more 1 1 1  -  Injury with Fall? No No No No -  Risk Factor Category  High Fall Risk - - - -  Risk for fall due to : - - History of fall(s);Impaired balance/gait;Impaired mobility History of fall(s);Impaired mobility -  Risk for fall due to: Comment - - - - -  Follow up Education provided;Falls prevention discussed - Education provided;Falls prevention discussed Falls prevention discussed -    Is the patient's home free of loose throw rugs in walkways, pet beds, electrical cords, etc?   yes      Grab bars in the bathroom? no      Handrails on the stairs?   yes      Adequate lighting?   no    Cognitive Function:  MMSE- score total 29          Screening Tests Health Maintenance  Topic Date Due  . DEXA SCAN  03/19/2016  . MAMMOGRAM  04/22/2018  . TETANUS/TDAP  10/10/2021  . INFLUENZA VACCINE  Completed  . PNA vac Low Risk Adult  Completed    Qualifies for Shingles Vaccine?  Yes, patient will consider for the future   Cancer Screenings: Lung: Low Dose CT Chest recommended if Age 56-80 years, 30 pack-year currently smoking  OR have quit w/in 15years. Patient does not qualify. Breast: Up to date on Mammogram? Yes   Up to date of Bone Density/Dexa? No, recommend at next visit, patient declined today Colorectal: discuss with Dr. Livia Snellen at next visit   Additional Screenings:  Hepatitis C Screening:  Not indicated      Plan:      Work on your goal of having 3 meals per day.  Good options to include- any fruits or vegetables that you like, yogurt, peanut butter, Ensure or boost shakes, whole wheat bread, brown rice,  oatmeal, lean proteins- chicken, [place], fish  Review the information on Advance Directives, and if you complete the paperwork please bring a copy to our office to be filed in your medical record.  Try Debrox for ear wax build up.   Follow up with Dr. Livia Snellen as scheduled.   I have personally reviewed and noted the following in the patient's chart:   . Medical and social history . Use of alcohol, tobacco or illicit drugs  . Current medications and supplements . Functional ability and status . Nutritional status . Physical activity . Advanced directives . List of other physicians . Hospitalizations, surgeries, and ER visits in previous 12 months . Vitals . Screenings to include cognitive, depression, and falls . Referrals and appointments  In addition, I have reviewed and discussed with patient certain preventive protocols, quality metrics, and best practice recommendations. A written personalized care plan for preventive services as well as general preventive health recommendations were provided to patient.     Shamika Pedregon M, RN   11/23/2017    I have reviewed and agree with the above AWV documentation.  Claretta Fraise, M.D.

## 2017-11-24 ENCOUNTER — Ambulatory Visit: Payer: Self-pay | Admitting: *Deleted

## 2017-11-27 ENCOUNTER — Other Ambulatory Visit: Payer: Self-pay | Admitting: *Deleted

## 2017-11-27 ENCOUNTER — Ambulatory Visit: Payer: Medicare Other | Admitting: *Deleted

## 2017-11-27 NOTE — Patient Outreach (Signed)
[facility]) Care Management  11/27/2017  Shirley Brown Shirley Brown 1940-11-10 [id number]   Care coordination Follow up call and case closure   [facility] RN CM called Shirley Brown  Patient is able to verify HIPAA Reviewed and addressed follow up call reasons (primary care, appointments, further s/s or falls, call from [facility] SW ) with patient  Her Daughter Shirley Brown continue to live with her She continues to voice concern about her son and daughter not "getting along"  Cm provided empathy, encouragement and allowed her time to ventilate her feelings  Appointments:  Shirley Brown, Primary MD and the wellness RN seen on 11/22/17 - Shirley Brown reports this appointment went better. She reports her sodium was good and she is now on potassium tablets. Shirley Brown went to the appointment with her   The next appointment is on 12/05/17 - 2 week f/u   Shirley Brown, neuro surgeon, to be seen on 12/07/17 per Shirley Brown She wants to re discuss pain/ popping, and cracking sounds in her head  She reports she discussed this previously with him   Shirley Brown did call and make an appointment with Shirley Brown but the next available appointment was with Shirley Brown on 11/16/17 and she did attend this appointment. She had a good evaluation  She restarted Pravastatin 10 mg daily and to follow up in 3 months, February 26 2018   Shirley Brown reports she is to have an appointment with an ENT in [place] but can not recall the ENT's name  for issues with her throat, thrush ?  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 She reports being given a "big pill to take that would melt in my mouth"  She now voices concerns that she has burning of her throat when she eats and drinks hot or spicy items  GI MD Shirley Brown to be seen on January 31 2018 - She would like to be seen earlier   CM discussed calling to be placed on the waiting list but she thinks Shirley Brown may have done that already Pain level today on a scale of 1-1- with 10 being the most extreme pain she reports her swallowing is 6-7, her Head pain is 3-4 and her  degenerative back pain always aches at 3-4 constantly She reports she has had back pain "all my life. 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)   Denies any further falls Confirmed [facility] SW contact - follow up to occur on 12/14/17   Conditions: Hyponatremia, Hyperlipidemia, HTN, dysphagia, hyperglycemia, GERD, generalized anxiety disorder, hypokalemia, No ST elevated MI, hypophosphatemia, anemia, constipation, skin lesions from shingles on right lower back/gluteal areas, cervical spondylosis,allergy to meropenem, osteoarthritis, left tonsillar/throat cancer, hx carotid artery occlusion, depression, former smoker,episodes of dysphagiaand had lost over 10 pounds within the past few weeksprior to hospitalization  Consent: [facility] RN CM reviewed [facility] services with patient. Patient gave verbal consent for services.   Plans  Pt encouraged to return a call to [facility] RN CM prn  [facility] RN CM will close case at this time as patient has been assessed and needs resolved. She is getting to her appointments and has been able to contact her MDs to reports s/s  Kimberly L. Lavina Hamman, RN, BSN, [place] Coordinator Office number [phone] Mobile number [phone]  Main [facility] number [phone] Fax number [phone]

## 2017-11-28 DIAGNOSIS — E860 Dehydration: Secondary | ICD-10-CM | POA: Diagnosis not present

## 2017-11-28 DIAGNOSIS — C052 Malignant neoplasm of uvula: Secondary | ICD-10-CM | POA: Diagnosis not present

## 2017-11-28 DIAGNOSIS — C051 Malignant neoplasm of soft palate: Secondary | ICD-10-CM | POA: Diagnosis not present

## 2017-11-28 DIAGNOSIS — J392 Other diseases of pharynx: Secondary | ICD-10-CM | POA: Diagnosis not present

## 2017-11-28 DIAGNOSIS — J029 Acute pharyngitis, unspecified: Secondary | ICD-10-CM | POA: Diagnosis not present

## 2017-11-28 DIAGNOSIS — E440 Moderate protein-calorie malnutrition: Secondary | ICD-10-CM | POA: Diagnosis not present

## 2017-11-28 DIAGNOSIS — Z923 Personal history of irradiation: Secondary | ICD-10-CM | POA: Diagnosis not present

## 2017-11-28 DIAGNOSIS — Z85818 Personal history of malignant neoplasm of other sites of lip, oral cavity, and pharynx: Secondary | ICD-10-CM | POA: Diagnosis not present

## 2017-12-05 ENCOUNTER — Ambulatory Visit (INDEPENDENT_AMBULATORY_CARE_PROVIDER_SITE_OTHER): Payer: Medicare Other | Admitting: Family Medicine

## 2017-12-05 ENCOUNTER — Encounter: Payer: Self-pay | Admitting: Family Medicine

## 2017-12-05 VITALS — BP 134/77 | HR 95 | Temp 97.3°F | Ht 60.0 in | Wt 148.8 lb

## 2017-12-05 DIAGNOSIS — E871 Hypo-osmolality and hyponatremia: Secondary | ICD-10-CM

## 2017-12-05 DIAGNOSIS — R1313 Dysphagia, pharyngeal phase: Secondary | ICD-10-CM | POA: Diagnosis not present

## 2017-12-05 NOTE — Progress Notes (Signed)
Subjective:  Patient ID: Shirley Brown, female    DOB: Feb 03, 1940  Age: 77 y.o. MRN: [id number]  CC: Follow-up (2 week)   HPI Wiktoria Plaster Mould presents for recheck of her low sodium. Due for lab today including sodium and osmolality. Currently concerned about results of her biopsy of her throat. Suggestive of recurrence of throat cancer. Upset that she hadn't been referred months ago. Still having balance issues. Needs to start P.T. Also still having trouble swallowing.   Depression screen [facility] 2/9 12/05/2017 11/27/2017 11/17/2017  Decreased Interest 0 1 1  Down, Depressed, Hopeless 0 1 1  PHQ - 2 Score 0 2 2  Altered sleeping - - 1  Tired, decreased energy - - 1  Change in appetite - - 0  Feeling bad or failure about yourself  - - 0  Trouble concentrating - - 0  Moving slowly or fidgety/restless - - 1  Suicidal thoughts - - 0  PHQ-9 Score - - 5  Difficult doing work/chores - - Somewhat difficult  Some recent data might be hidden    History Endiya has a past medical history of Allergy to meropenem, Anemia, Arthritis, Cancer ([place]), Carotid 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   She has a past surgical history that includes Cholecystectomy; Joint replacement (Right, 1995); Joint replacement (Left, 2001); Appendectomy; and Throat surgery (Left).   Her family history includes Cancer in her brother; Diabetes in her son; Heart attack in her father; Heart disease in her father and sister; Hyperlipidemia in her father, sister, and son; Hypertension in her father and sister; Other in her sister.She reports that she quit smoking about 24 years ago. Her smoking use included cigarettes. She has never used smokeless tobacco. She reports that she does not drink alcohol or use drugs.    ROS Review of Systems  Constitutional: Negative.   HENT: Positive for trouble swallowing.   Eyes: Negative for visual disturbance.  Respiratory: Negative  for shortness of breath.   Cardiovascular: Negative for chest pain.  Gastrointestinal: Negative for abdominal pain.  Musculoskeletal: Positive for gait problem. Negative for arthralgias.  Neurological: Positive for dizziness.    Objective:  BP 134/77   Pulse 95   Temp (!) 97.3 F (36.3 C) (Oral)   Ht 5' (1.524 m)   Wt 148 lb 12.8 oz (67.5 kg)   BMI 29.06 kg/m   BP Readings from Last 3 Encounters:  12/05/17 134/77  11/22/17 (!) 149/64  11/22/17 (!) 144/75    Wt Readings from Last 3 Encounters:  12/05/17 148 lb 12.8 oz (67.5 kg)  11/22/17 150 lb (68 kg)  11/22/17 151 lb (68.5 kg)     Physical Exam  Constitutional: She is oriented to person, place, and time. She appears well-developed and well-nourished. No distress.  Cardiovascular: Normal rate and regular rhythm.  Pulmonary/Chest: Breath sounds normal.  Neurological: She is alert and oriented to person, place, and time.  Skin: Skin is warm and dry.  Psychiatric: She has a normal mood and affect.      Assessment & Plan:   Ronelle was seen today for follow-up.  Diagnoses and all orders for this visit:  Hyponatremia -     BMP8+EGFR -     Osmolality -     Magnesium  Hypophosphatemia -     BMP8+EGFR -     Phosphorus -     Magnesium  Pharyngeal dysphagia   Awaiting result of biopsy.    I am having [facility] maintain her Cyanocobalamin (VITAMIN B 12 PO), Vitamin D3, aspirin, acyclovir, pantoprazole, zolpidem, linaclotide, amLODipine, DULoxetine, folic acid, pravastatin, and potassium chloride SA.  Allergies as of 12/05/2017      Reactions   Bupropion Other (See Comments)   Lips swelled   Sulfa Antibiotics Itching, Rash   Benadryl [diphenhydramine Hcl (sleep)] Other (See Comments)   Insomnia   Doxycycline Nausea And Vomiting   Dizziness   Gabapentin Other (See Comments)   Drunk feeling, staggering   Desyrel [trazodone] Other (See Comments)   Keeps me up all night      Medication List          Accurate as of 12/05/17 11:59 PM. Always use your most recent med list.          acyclovir 400 MG tablet Commonly known as:  ZOVIRAX TAKE 1 TABLET BY MOUTH ONCE DAILY   amLODipine 2.5 MG tablet Commonly known as:  NORVASC Take 1 tablet (2.5 mg total) by mouth daily.   aspirin 81 MG tablet Take 81 mg by mouth daily.   DULoxetine 30 MG capsule Commonly known as:  CYMBALTA Take 1 capsule (30 mg total) by mouth daily.   folic acid 1 MG tablet Commonly known as:  FOLVITE TAKE 1 TABLET BY MOUTH ONCE DAILY   linaclotide 145 MCG Caps capsule Commonly known as:  LINZESS Take 1 capsule (145 mcg total) by mouth daily. To regulate bowel movements   pantoprazole 40 MG tablet Commonly known as:  PROTONIX TAKE 1 TABLET BY MOUTH TWICE A DAY   potassium chloride SA 20 MEQ tablet Commonly known as:  K-DUR,KLOR-CON Take 1 tablet (20 mEq total) by mouth daily.   pravastatin 10 MG tablet Commonly known as:  PRAVACHOL Take 1 tablet (10 mg total) by mouth daily.   VITAMIN B 12 PO Take 1 tablet by mouth daily.   Vitamin D3 75 MCG (3000 UT) Tabs Take 1 tablet by mouth daily.   zolpidem 10 MG tablet Commonly known as:  AMBIEN TAKE 1 TABLET BY MOUTH EVERY DAY AT BEDTIME AS NEEDED        Follow-up: Return in about 1 month (around 01/04/2018).  Claretta Fraise, M.D.

## 2017-12-06 LAB — BMP8+EGFR
BUN / CREAT RATIO: 13 (ref 12–28)
BUN: 10 mg/dL (ref 8–27)
CO2: 22 mmol/L (ref 20–29)
Calcium: 9.8 mg/dL (ref 8.7–10.3)
Chloride: 98 mmol/L (ref 96–106)
Creatinine, Ser: 0.78 mg/dL (ref 0.57–1.00)
GFR calc Af Amer: 85 mL/min/{1.73_m2} (ref >59)
GFR calc non Af Amer: 74 mL/min/{1.73_m2} (ref >59)
Glucose: 99 mg/dL (ref 65–99)
Potassium: 4.5 mmol/L (ref 3.5–5.2)
SODIUM: 135 mmol/L (ref 134–144)

## 2017-12-06 LAB — OSMOLALITY: OSMOLALITY MEAS: 281 mosm/kg (ref 280–301)

## 2017-12-06 LAB — PHOSPHORUS: Phosphorus: 3.5 mg/dL (ref 2.5–4.5)

## 2017-12-06 LAB — MAGNESIUM: Magnesium: 1.9 mg/dL (ref 1.6–2.3)

## 2017-12-06 NOTE — Progress Notes (Signed)
Hello Shirley Brown,  Your lab result is normal.Some minor variations that are not significant are commonly marked abnormal, but do not represent any medical problem for you.  Best regards, Claretta Fraise, M.D.

## 2017-12-07 ENCOUNTER — Encounter: Payer: Self-pay | Admitting: Family Medicine

## 2017-12-11 ENCOUNTER — Other Ambulatory Visit (HOSPITAL_COMMUNITY): Payer: Self-pay | Admitting: Otolaryngology

## 2017-12-11 ENCOUNTER — Telehealth: Payer: Self-pay | Admitting: Family Medicine

## 2017-12-11 DIAGNOSIS — Z923 Personal history of irradiation: Secondary | ICD-10-CM

## 2017-12-14 ENCOUNTER — Other Ambulatory Visit: Payer: Self-pay

## 2017-12-14 NOTE — Patient Outreach (Signed)
[place] [facility]) Care Management  12/14/2017  [name] 26-Oct-1940 [id number]  Successful outreach to the patient on today's date, HIPAA identifiers confirmed. The patient is able to confirm receipt of mailed resources. The patient denies questions surrounding these resources. BSW to perform a case closure as no other needs have been identified. The patient is in agreement with this plan.  Daneen Schick, BSW, CDP [facility] [phone]

## 2017-12-15 NOTE — Telephone Encounter (Signed)
Aware of results. 

## 2017-12-20 ENCOUNTER — Encounter (HOSPITAL_COMMUNITY)
Admission: RE | Admit: 2017-12-20 | Discharge: 2017-12-20 | Disposition: A | Discharge status: Home / Self Care | Payer: Medicare Other | Source: Ambulatory Visit | Attending: Otolaryngology | Admitting: Otolaryngology

## 2017-12-20 ENCOUNTER — Ambulatory Visit (HOSPITAL_COMMUNITY)
Admission: RE | Admit: 2017-12-20 | Discharge: 2017-12-20 | Disposition: A | Discharge status: Home / Self Care | Payer: Medicare Other | Source: Ambulatory Visit | Attending: Otolaryngology | Admitting: Otolaryngology

## 2017-12-20 DIAGNOSIS — Z923 Personal history of irradiation: Secondary | ICD-10-CM

## 2017-12-20 DIAGNOSIS — C109 Malignant neoplasm of oropharynx, unspecified: Secondary | ICD-10-CM | POA: Diagnosis not present

## 2017-12-20 DIAGNOSIS — C140 Malignant neoplasm of pharynx, unspecified: Secondary | ICD-10-CM | POA: Diagnosis not present

## 2017-12-20 LAB — GLUCOSE, CAPILLARY: Glucose-Capillary: 111 mg/dL — ABNORMAL HIGH (ref 70–99)

## 2017-12-20 MED ORDER — SODIUM CHLORIDE (PF) 0.9 % IJ SOLN
INTRAMUSCULAR | Status: AC
  Filled 2017-12-20: qty 50

## 2017-12-20 MED ORDER — FLUDEOXYGLUCOSE F - 18 (FDG) INJECTION
7.3300 | Freq: Once | INTRAVENOUS | Status: AC | PRN
  Administered 2017-12-20: 7.33 via INTRAVENOUS

## 2017-12-20 MED ORDER — IOHEXOL 300 MG/ML  SOLN
75.0000 mL | Freq: Once | INTRAMUSCULAR | Status: AC | PRN
  Administered 2017-12-20: 75 mL via INTRAVENOUS

## 2017-12-24 ENCOUNTER — Other Ambulatory Visit: Payer: Self-pay | Admitting: Family Medicine

## 2018-01-05 ENCOUNTER — Telehealth: Payer: Self-pay | Admitting: *Deleted

## 2018-01-05 ENCOUNTER — Other Ambulatory Visit: Payer: Self-pay | Admitting: *Deleted

## 2018-01-05 ENCOUNTER — Other Ambulatory Visit: Payer: Self-pay | Admitting: Family Medicine

## 2018-01-05 MED ORDER — ZOLPIDEM TARTRATE 10 MG PO TABS: 10.0000 mg | ORAL_TABLET | Freq: Every day | ORAL | 2 refills | Status: DC

## 2018-01-05 NOTE — Telephone Encounter (Signed)
Please review

## 2018-01-05 NOTE — Telephone Encounter (Signed)
I sent in the requested prescription 

## 2018-01-05 NOTE — Telephone Encounter (Signed)
I have not seen this patient and therefore cannot do a narcotic outside of the office visit.

## 2018-01-05 NOTE — Telephone Encounter (Signed)
Pt states she would like to start seeing Particia Nearing as her PCP. Is it ok to switch PCP to you and if so do you need to see her to establish care?

## 2018-01-05 NOTE — Telephone Encounter (Signed)
ok 

## 2018-01-05 NOTE — Telephone Encounter (Signed)
Pt aware and switched PCP in chart.

## 2018-01-05 NOTE — Telephone Encounter (Signed)
aware

## 2018-01-15 ENCOUNTER — Other Ambulatory Visit: Payer: Self-pay | Admitting: Family Medicine

## 2018-01-31 ENCOUNTER — Encounter

## 2018-01-31 ENCOUNTER — Ambulatory Visit (INDEPENDENT_AMBULATORY_CARE_PROVIDER_SITE_OTHER): Payer: Medicare Other | Admitting: Physician Assistant

## 2018-01-31 ENCOUNTER — Encounter: Payer: Self-pay | Admitting: Physician Assistant

## 2018-01-31 ENCOUNTER — Ambulatory Visit: Payer: Self-pay | Admitting: Nurse Practitioner

## 2018-01-31 VITALS — BP 109/58 | HR 86 | Temp 97.3°F | Ht 60.0 in | Wt 139.0 lb

## 2018-01-31 DIAGNOSIS — I10 Essential (primary) hypertension: Secondary | ICD-10-CM | POA: Diagnosis not present

## 2018-01-31 DIAGNOSIS — D490 Neoplasm of unspecified behavior of digestive system: Secondary | ICD-10-CM

## 2018-01-31 DIAGNOSIS — D649 Anemia, unspecified: Secondary | ICD-10-CM | POA: Diagnosis not present

## 2018-01-31 DIAGNOSIS — R220 Localized swelling, mass and lump, head: Secondary | ICD-10-CM | POA: Diagnosis not present

## 2018-01-31 DIAGNOSIS — J358 Other chronic diseases of tonsils and adenoids: Secondary | ICD-10-CM

## 2018-01-31 DIAGNOSIS — E871 Hypo-osmolality and hyponatremia: Secondary | ICD-10-CM | POA: Diagnosis not present

## 2018-01-31 DIAGNOSIS — E876 Hypokalemia: Secondary | ICD-10-CM

## 2018-01-31 MED ORDER — DULOXETINE HCL 30 MG PO CPEP: ORAL_CAPSULE | ORAL | 5 refills | Status: DC

## 2018-01-31 MED ORDER — PANTOPRAZOLE SODIUM 40 MG PO TBEC: 40.0000 mg | DELAYED_RELEASE_TABLET | Freq: Two times a day (BID) | ORAL | 1 refills | Status: DC

## 2018-01-31 MED ORDER — LINACLOTIDE 145 MCG PO CAPS: 145.0000 ug | ORAL_CAPSULE | Freq: Every day | ORAL | 5 refills | Status: DC

## 2018-01-31 MED ORDER — ZOLPIDEM TARTRATE 10 MG PO TABS: 10.0000 mg | ORAL_TABLET | Freq: Every day | ORAL | 2 refills | Status: DC

## 2018-02-01 DIAGNOSIS — D490 Neoplasm of unspecified behavior of digestive system: Secondary | ICD-10-CM | POA: Insufficient documentation

## 2018-02-01 DIAGNOSIS — J358 Other chronic diseases of tonsils and adenoids: Secondary | ICD-10-CM | POA: Insufficient documentation

## 2018-02-01 DIAGNOSIS — R220 Localized swelling, mass and lump, head: Principal | ICD-10-CM

## 2018-02-01 LAB — CBC WITH DIFFERENTIAL/PLATELET
BASOS: 1 %
Basophils Absolute: 0.1 10*3/uL (ref 0.0–0.2)
EOS (ABSOLUTE): 0.2 10*3/uL (ref 0.0–0.4)
EOS: 4 %
HEMOGLOBIN: 9.8 g/dL — AB (ref 11.1–15.9)
Hematocrit: 27.9 % — ABNORMAL LOW (ref 34.0–46.6)
IMMATURE GRANULOCYTES: 0 %
Immature Grans (Abs): 0 10*3/uL (ref 0.0–0.1)
LYMPHS ABS: 1.4 10*3/uL (ref 0.7–3.1)
Lymphs: 31 %
MCH: 31.7 pg (ref 26.6–33.0)
MCHC: 35.1 g/dL (ref 31.5–35.7)
MCV: 90 fL (ref 79–97)
MONOCYTES: 11 %
MONOS ABS: 0.5 10*3/uL (ref 0.1–0.9)
Neutrophils Absolute: 2.5 10*3/uL (ref 1.4–7.0)
Neutrophils: 53 %
Platelets: 209 10*3/uL (ref 150–450)
RBC: 3.09 x10E6/uL — AB (ref 3.77–5.28)
RDW: 13 % (ref 11.7–15.4)
WBC: 4.7 10*3/uL (ref 3.4–10.8)

## 2018-02-01 LAB — CMP14+EGFR
ALBUMIN: 4.1 g/dL (ref 3.5–4.8)
ALT: 13 IU/L (ref 0–32)
AST: 28 IU/L (ref 0–40)
Albumin/Globulin Ratio: 1.6 (ref 1.2–2.2)
Alkaline Phosphatase: 82 IU/L (ref 39–117)
BUN / CREAT RATIO: 18 (ref 12–28)
BUN: 16 mg/dL (ref 8–27)
Bilirubin Total: 0.6 mg/dL (ref 0.0–1.2)
CO2: 23 mmol/L (ref 20–29)
CREATININE: 0.91 mg/dL (ref 0.57–1.00)
Calcium: 9.7 mg/dL (ref 8.7–10.3)
Chloride: 98 mmol/L (ref 96–106)
GFR calc Af Amer: 71 mL/min/{1.73_m2} (ref >59)
GFR calc non Af Amer: 61 mL/min/{1.73_m2} (ref >59)
GLOBULIN, TOTAL: 2.6 g/dL (ref 1.5–4.5)
GLUCOSE: 85 mg/dL (ref 65–99)
Potassium: 4.3 mmol/L (ref 3.5–5.2)
SODIUM: 135 mmol/L (ref 134–144)
TOTAL PROTEIN: 6.7 g/dL (ref 6.0–8.5)

## 2018-02-01 LAB — LIPID PANEL
CHOL/HDL RATIO: 2.7 ratio (ref 0.0–4.4)
Cholesterol, Total: 158 mg/dL (ref 100–199)
HDL: 59 mg/dL (ref >39)
LDL CALC: 86 mg/dL (ref 0–99)
TRIGLYCERIDES: 65 mg/dL (ref 0–149)
VLDL Cholesterol Cal: 13 mg/dL (ref 5–40)

## 2018-02-01 LAB — IRON: IRON: 69 ug/dL (ref 27–139)

## 2018-02-01 NOTE — Progress Notes (Signed)
BP (!) 109/58   Pulse 86   Temp (!) 97.3 F (36.3 C) (Oral)   Ht 5' (1.524 m)   Wt 139 lb (63 kg)   BMI 27.15 kg/m    Subjective:    Patient ID: Shirley Brown, female    DOB: 1940/08/12, 78 y.o.   MRN: [id number]  HPI: Shirley Brown is a 78 y.o. female presenting on 01/31/2018 for Hypertension (establish with you ); Hyperlipidemia; and Throat cancer  This patient comes in for periodic recheck on medications and conditions including right tonsillar mass seen on CT. She needs referral to ENT.   All medications are reviewed today. There are no reports of any problems with the medications. All of the medical conditions are reviewed and updated.  Lab work is reviewed and will be ordered as medically necessary. There are no new problems reported with today's visit.   Past Medical History:  Diagnosis Date  . Allergy to meropenem   . Anemia   . Arthritis   . 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 Carotid artery occlusion 05/11/09   Bruit - Right  . Depression 2011  . Hyperlipidemia   . Hypertension    Relevant past medical, surgical, family and social history reviewed and updated as indicated. Interim medical history since our last visit reviewed. Allergies and medications reviewed and updated. DATA REVIEWED: CHART IN EPIC  Family History reviewed for pertinent findings.  Review of Systems  Constitutional: Negative.  Negative for activity change, fatigue and fever.  HENT: Negative.   Eyes: Negative.   Respiratory: Negative.  Negative for cough.   Cardiovascular: Negative.  Negative for chest pain.  Gastrointestinal: Negative.  Negative for abdominal pain.  Endocrine: Negative.   Genitourinary: Negative.  Negative for dysuria.  Musculoskeletal: Negative.   Skin: Negative.   Neurological: Negative.     Allergies as of 01/31/2018      Reactions   Bupropion Other (See Comments)   Lips swelled   Sulfa Antibiotics Itching, Rash   Benadryl [diphenhydramine Hcl  (sleep)] Other (See Comments)   Insomnia   Doxycycline Nausea And Vomiting   Dizziness   Gabapentin Other (See Comments)   Drunk feeling, staggering   Desyrel [trazodone] Other (See Comments)   Keeps me up all night      Medication List       Accurate as of January 31, 2018 11:59 PM. Always use your most recent med list.        acyclovir 400 MG tablet Commonly known as:  ZOVIRAX TAKE 1 TABLET BY MOUTH ONCE DAILY   amLODipine 2.5 MG tablet Commonly known as:  NORVASC TAKE 1 TABLET BY MOUTH EVERY DAY   aspirin 81 MG tablet Take 81 mg by mouth daily.   DULoxetine 30 MG capsule Commonly known as:  CYMBALTA TAKE 1 CAPSULE BY MOUTH EVERY DAY   folic acid 1 MG tablet Commonly known as:  FOLVITE TAKE 1 TABLET BY MOUTH ONCE DAILY   linaclotide 145 MCG Caps capsule Commonly known as:  LINZESS Take 1 capsule (145 mcg total) by mouth daily. To regulate bowel movements   pantoprazole 40 MG tablet Commonly known as:  PROTONIX Take 1 tablet (40 mg total) by mouth 2 (two) times daily.   potassium chloride SA 20 MEQ tablet Commonly known as:  K-DUR,KLOR-CON Take 1 tablet (20 mEq total) by mouth daily.   pravastatin 10 MG tablet Commonly known as:  PRAVACHOL Take 1 tablet (10 mg total) by  mouth daily.   VITAMIN B 12 PO Take 1 tablet by mouth daily.   Vitamin D3 75 MCG (3000 UT) Tabs Take 1 tablet by mouth daily.   zolpidem 10 MG tablet Commonly known as:  AMBIEN Take 1 tablet (10 mg total) by mouth at bedtime.          Objective:    BP (!) 109/58   Pulse 86   Temp (!) 97.3 F (36.3 C) (Oral)   Ht 5' (1.524 m)   Wt 139 lb (63 kg)   BMI 27.15 kg/m   Allergies  Allergen Reactions  . Bupropion Other (See Comments)    Lips swelled  . Sulfa Antibiotics Itching and Rash  . Benadryl [Diphenhydramine Hcl (Sleep)] Other (See Comments)    Insomnia   . Doxycycline Nausea And Vomiting    Dizziness   . Gabapentin Other (See Comments)    Drunk feeling, staggering   . Desyrel [Trazodone] Other (See Comments)    Keeps me up all night    Wt Readings from Last 3 Encounters:  01/31/18 139 lb (63 kg)  12/05/17 148 lb 12.8 oz (67.5 kg)  11/22/17 150 lb (68 kg)    Physical Exam Constitutional:      Appearance: She is well-developed.  HENT:     Head: Normocephalic and atraumatic.  Eyes:     Conjunctiva/sclera: Conjunctivae normal.     Pupils: Pupils are equal, round, and reactive to light.  Cardiovascular:     Rate and Rhythm: Normal rate and regular rhythm.     Heart sounds: Normal heart sounds.  Pulmonary:     Effort: Pulmonary effort is normal.     Breath sounds: Normal breath sounds.  Abdominal:     General: Bowel sounds are normal.     Palpations: Abdomen is soft.  Skin:    General: Skin is warm and dry.     Findings: No rash.  Neurological:     Mental Status: She is alert and oriented to person, place, and time.     Deep Tendon Reflexes: Reflexes are normal and symmetric.  Psychiatric:        Behavior: Behavior normal.        Thought Content: Thought content normal.        Judgment: Judgment normal.     Results for orders placed or performed in visit on 01/31/18  CMP14+EGFR  Result Value Ref Range   Glucose 85 65 - 99 mg/dL   BUN 16 8 - 27 mg/dL   Creatinine, Ser 0.91 0.57 - 1.00 mg/dL   GFR calc non Af Amer 61 >59 mL/min/1.73   GFR calc Af Amer 71 >59 mL/min/1.73   BUN/Creatinine Ratio 18 12 - 28   Sodium 135 134 - 144 mmol/L   Potassium 4.3 3.5 - 5.2 mmol/L   Chloride 98 96 - 106 mmol/L   CO2 23 20 - 29 mmol/L   Calcium 9.7 8.7 - 10.3 mg/dL   Total Protein 6.7 6.0 - 8.5 g/dL   Albumin 4.1 3.5 - 4.8 g/dL   Globulin, Total 2.6 1.5 - 4.5 g/dL   Albumin/Globulin Ratio 1.6 1.2 - 2.2   Bilirubin Total 0.6 0.0 - 1.2 mg/dL   Alkaline Phosphatase 82 39 - 117 IU/L   AST 28 0 - 40 IU/L   ALT 13 0 - 32 IU/L  CBC with Differential/Platelet  Result Value Ref Range   WBC 4.7 3.4 - 10.8 x10E3/uL   RBC 3.09 (L) 3.77 - 5.28  x10E6/uL   Hemoglobin 9.8 (L) 11.1 - 15.9 g/dL   Hematocrit 27.9 (L) 34.0 - 46.6 %   MCV 90 79 - 97 fL   MCH 31.7 26.6 - 33.0 pg   MCHC 35.1 31.5 - 35.7 g/dL   RDW 13.0 11.7 - 15.4 %   Platelets 209 150 - 450 x10E3/uL   Neutrophils 53 Not Estab. %   Lymphs 31 Not Estab. %   Monocytes 11 Not Estab. %   Eos 4 Not Estab. %   Basos 1 Not Estab. %   Neutrophils Absolute 2.5 1.4 - 7.0 x10E3/uL   Lymphocytes Absolute 1.4 0.7 - 3.1 x10E3/uL   Monocytes Absolute 0.5 0.1 - 0.9 x10E3/uL   EOS (ABSOLUTE) 0.2 0.0 - 0.4 x10E3/uL   Basophils Absolute 0.1 0.0 - 0.2 x10E3/uL   Immature Granulocytes 0 Not Estab. %   Immature Grans (Abs) 0.0 0.0 - 0.1 x10E3/uL  Lipid panel  Result Value Ref Range   Cholesterol, Total 158 100 - 199 mg/dL   Triglycerides 65 0 - 149 mg/dL   HDL 59 >39 mg/dL   VLDL Cholesterol Cal 13 5 - 40 mg/dL   LDL Calculated 86 0 - 99 mg/dL   Chol/HDL Ratio 2.7 0.0 - 4.4 ratio  Iron  Result Value Ref Range   Iron 69 27 - 139 ug/dL      Assessment & Plan:   1. Tonsillar mass - Ambulatory referral to ENT - CMP14+EGFR - CBC with Differential/Platelet - Lipid panel  2. Hyponatremia - CMP14+EGFR - CBC with Differential/Platelet - Lipid panel - Iron  3. Hypokalemia - CMP14+EGFR - CBC with Differential/Platelet  4. Anemia, unspecified type - Iron  5. 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  Follow up plan: Return in about 4 weeks (around 02/28/2018).  Educational handout given for [place] PA-C [facility] [address]  [place], [place] [address] [phone]   02/01/2018, 7:26 PM

## 2018-02-09 DIAGNOSIS — C140 Malignant neoplasm of pharynx, unspecified: Secondary | ICD-10-CM | POA: Diagnosis not present

## 2018-02-21 DIAGNOSIS — C051 Malignant neoplasm of soft palate: Secondary | ICD-10-CM | POA: Diagnosis not present

## 2018-02-21 DIAGNOSIS — Z85818 Personal history of malignant neoplasm of other sites of lip, oral cavity, and pharynx: Secondary | ICD-10-CM | POA: Diagnosis not present

## 2018-02-26 ENCOUNTER — Ambulatory Visit: Payer: Self-pay | Admitting: Cardiovascular Disease

## 2018-02-27 DIAGNOSIS — C099 Malignant neoplasm of tonsil, unspecified: Secondary | ICD-10-CM | POA: Diagnosis not present

## 2018-03-06 ENCOUNTER — Ambulatory Visit: Payer: Medicare Other | Admitting: Physician Assistant

## 2018-03-06 DIAGNOSIS — R1312 Dysphagia, oropharyngeal phase: Secondary | ICD-10-CM | POA: Diagnosis not present

## 2018-03-12 DIAGNOSIS — Z923 Personal history of irradiation: Secondary | ICD-10-CM | POA: Diagnosis not present

## 2018-03-12 DIAGNOSIS — C109 Malignant neoplasm of oropharynx, unspecified: Secondary | ICD-10-CM | POA: Diagnosis not present

## 2018-03-12 DIAGNOSIS — E440 Moderate protein-calorie malnutrition: Secondary | ICD-10-CM | POA: Diagnosis not present

## 2018-03-12 DIAGNOSIS — C099 Malignant neoplasm of tonsil, unspecified: Secondary | ICD-10-CM | POA: Diagnosis not present

## 2018-03-12 DIAGNOSIS — I10 Essential (primary) hypertension: Secondary | ICD-10-CM | POA: Diagnosis not present

## 2018-04-03 DIAGNOSIS — C099 Malignant neoplasm of tonsil, unspecified: Secondary | ICD-10-CM | POA: Diagnosis not present

## 2018-04-03 DIAGNOSIS — I313 Pericardial effusion (noninflammatory): Secondary | ICD-10-CM | POA: Diagnosis not present

## 2018-04-04 ENCOUNTER — Other Ambulatory Visit: Payer: Self-pay | Admitting: Oncology

## 2018-04-04 ENCOUNTER — Other Ambulatory Visit (HOSPITAL_COMMUNITY): Payer: Self-pay | Admitting: Oncology

## 2018-04-04 DIAGNOSIS — C099 Malignant neoplasm of tonsil, unspecified: Secondary | ICD-10-CM

## 2018-04-10 ENCOUNTER — Other Ambulatory Visit (HOSPITAL_COMMUNITY): Payer: Self-pay | Admitting: Oncology

## 2018-04-10 ENCOUNTER — Other Ambulatory Visit: Payer: Self-pay

## 2018-04-10 ENCOUNTER — Encounter (HOSPITAL_COMMUNITY)
Admission: RE | Admit: 2018-04-10 | Discharge: 2018-04-10 | Disposition: A | Discharge status: Home / Self Care | Payer: Medicare Other | Source: Ambulatory Visit | Attending: Oncology | Admitting: Oncology

## 2018-04-10 DIAGNOSIS — C099 Malignant neoplasm of tonsil, unspecified: Secondary | ICD-10-CM

## 2018-04-10 DIAGNOSIS — C103 Malignant neoplasm of posterior wall of oropharynx: Secondary | ICD-10-CM | POA: Diagnosis not present

## 2018-04-10 LAB — GLUCOSE, CAPILLARY: GLUCOSE-CAPILLARY: 95 mg/dL (ref 70–99)

## 2018-04-10 MED ORDER — FLUDEOXYGLUCOSE F - 18 (FDG) INJECTION
7.8400 | Freq: Once | INTRAVENOUS | Status: AC | PRN
  Administered 2018-04-10: 7.84 via INTRAVENOUS

## 2018-04-11 DIAGNOSIS — I313 Pericardial effusion (noninflammatory): Secondary | ICD-10-CM | POA: Diagnosis not present

## 2018-04-11 DIAGNOSIS — I340 Nonrheumatic mitral (valve) insufficiency: Secondary | ICD-10-CM | POA: Diagnosis not present

## 2018-04-11 DIAGNOSIS — I351 Nonrheumatic aortic (valve) insufficiency: Secondary | ICD-10-CM | POA: Diagnosis not present

## 2018-04-11 DIAGNOSIS — I517 Cardiomegaly: Secondary | ICD-10-CM | POA: Diagnosis not present

## 2018-04-11 DIAGNOSIS — Z85818 Personal history of malignant neoplasm of other sites of lip, oral cavity, and pharynx: Secondary | ICD-10-CM | POA: Diagnosis not present

## 2018-04-11 DIAGNOSIS — C099 Malignant neoplasm of tonsil, unspecified: Secondary | ICD-10-CM | POA: Diagnosis not present

## 2018-04-16 DIAGNOSIS — Z888 Allergy status to other drugs, medicaments and biological substances status: Secondary | ICD-10-CM | POA: Diagnosis not present

## 2018-04-16 DIAGNOSIS — Z882 Allergy status to sulfonamides status: Secondary | ICD-10-CM | POA: Diagnosis not present

## 2018-04-16 DIAGNOSIS — Z9049 Acquired absence of other specified parts of digestive tract: Secondary | ICD-10-CM | POA: Diagnosis not present

## 2018-04-16 DIAGNOSIS — C099 Malignant neoplasm of tonsil, unspecified: Secondary | ICD-10-CM | POA: Diagnosis not present

## 2018-04-16 DIAGNOSIS — Z881 Allergy status to other antibiotic agents status: Secondary | ICD-10-CM | POA: Diagnosis not present

## 2018-04-16 DIAGNOSIS — R1310 Dysphagia, unspecified: Secondary | ICD-10-CM | POA: Diagnosis not present

## 2018-04-16 DIAGNOSIS — K219 Gastro-esophageal reflux disease without esophagitis: Secondary | ICD-10-CM | POA: Diagnosis not present

## 2018-04-16 DIAGNOSIS — Z7982 Long term (current) use of aspirin: Secondary | ICD-10-CM | POA: Diagnosis not present

## 2018-04-16 DIAGNOSIS — E785 Hyperlipidemia, unspecified: Secondary | ICD-10-CM | POA: Diagnosis not present

## 2018-04-16 DIAGNOSIS — M1990 Unspecified osteoarthritis, unspecified site: Secondary | ICD-10-CM | POA: Diagnosis not present

## 2018-04-16 DIAGNOSIS — Z886 Allergy status to analgesic agent status: Secondary | ICD-10-CM | POA: Diagnosis not present

## 2018-04-16 DIAGNOSIS — Z87891 Personal history of nicotine dependence: Secondary | ICD-10-CM | POA: Diagnosis not present

## 2018-04-16 DIAGNOSIS — Z79899 Other long term (current) drug therapy: Secondary | ICD-10-CM | POA: Diagnosis not present

## 2018-04-16 DIAGNOSIS — Z96653 Presence of artificial knee joint, bilateral: Secondary | ICD-10-CM | POA: Diagnosis not present

## 2018-04-16 DIAGNOSIS — I10 Essential (primary) hypertension: Secondary | ICD-10-CM | POA: Diagnosis not present

## 2018-04-17 DIAGNOSIS — Z882 Allergy status to sulfonamides status: Secondary | ICD-10-CM | POA: Diagnosis not present

## 2018-04-17 DIAGNOSIS — M1990 Unspecified osteoarthritis, unspecified site: Secondary | ICD-10-CM | POA: Diagnosis not present

## 2018-04-17 DIAGNOSIS — Z888 Allergy status to other drugs, medicaments and biological substances status: Secondary | ICD-10-CM | POA: Diagnosis not present

## 2018-04-17 DIAGNOSIS — K219 Gastro-esophageal reflux disease without esophagitis: Secondary | ICD-10-CM | POA: Diagnosis not present

## 2018-04-17 DIAGNOSIS — Z7982 Long term (current) use of aspirin: Secondary | ICD-10-CM | POA: Diagnosis not present

## 2018-04-17 DIAGNOSIS — Z96653 Presence of artificial knee joint, bilateral: Secondary | ICD-10-CM | POA: Diagnosis not present

## 2018-04-17 DIAGNOSIS — Z886 Allergy status to analgesic agent status: Secondary | ICD-10-CM | POA: Diagnosis not present

## 2018-04-17 DIAGNOSIS — I10 Essential (primary) hypertension: Secondary | ICD-10-CM | POA: Diagnosis not present

## 2018-04-17 DIAGNOSIS — Z79899 Other long term (current) drug therapy: Secondary | ICD-10-CM | POA: Diagnosis not present

## 2018-04-17 DIAGNOSIS — Z9049 Acquired absence of other specified parts of digestive tract: Secondary | ICD-10-CM | POA: Diagnosis not present

## 2018-04-17 DIAGNOSIS — Z881 Allergy status to other antibiotic agents status: Secondary | ICD-10-CM | POA: Diagnosis not present

## 2018-04-17 DIAGNOSIS — Z87891 Personal history of nicotine dependence: Secondary | ICD-10-CM | POA: Diagnosis not present

## 2018-04-17 DIAGNOSIS — C099 Malignant neoplasm of tonsil, unspecified: Secondary | ICD-10-CM | POA: Diagnosis not present

## 2018-04-17 DIAGNOSIS — R1310 Dysphagia, unspecified: Secondary | ICD-10-CM | POA: Diagnosis not present

## 2018-04-17 DIAGNOSIS — Z452 Encounter for adjustment and management of vascular access device: Secondary | ICD-10-CM | POA: Diagnosis not present

## 2018-04-17 DIAGNOSIS — E785 Hyperlipidemia, unspecified: Secondary | ICD-10-CM | POA: Diagnosis not present

## 2018-04-18 DIAGNOSIS — Z882 Allergy status to sulfonamides status: Secondary | ICD-10-CM | POA: Diagnosis not present

## 2018-04-18 DIAGNOSIS — C099 Malignant neoplasm of tonsil, unspecified: Secondary | ICD-10-CM | POA: Diagnosis not present

## 2018-04-18 DIAGNOSIS — Z96653 Presence of artificial knee joint, bilateral: Secondary | ICD-10-CM | POA: Diagnosis not present

## 2018-04-18 DIAGNOSIS — Z881 Allergy status to other antibiotic agents status: Secondary | ICD-10-CM | POA: Diagnosis not present

## 2018-04-18 DIAGNOSIS — Z7982 Long term (current) use of aspirin: Secondary | ICD-10-CM | POA: Diagnosis not present

## 2018-04-18 DIAGNOSIS — E785 Hyperlipidemia, unspecified: Secondary | ICD-10-CM | POA: Diagnosis not present

## 2018-04-18 DIAGNOSIS — I10 Essential (primary) hypertension: Secondary | ICD-10-CM | POA: Diagnosis not present

## 2018-04-18 DIAGNOSIS — Z888 Allergy status to other drugs, medicaments and biological substances status: Secondary | ICD-10-CM | POA: Diagnosis not present

## 2018-04-18 DIAGNOSIS — Z886 Allergy status to analgesic agent status: Secondary | ICD-10-CM | POA: Diagnosis not present

## 2018-04-18 DIAGNOSIS — Z9049 Acquired absence of other specified parts of digestive tract: Secondary | ICD-10-CM | POA: Diagnosis not present

## 2018-04-18 DIAGNOSIS — Z79899 Other long term (current) drug therapy: Secondary | ICD-10-CM | POA: Diagnosis not present

## 2018-04-18 DIAGNOSIS — R1310 Dysphagia, unspecified: Secondary | ICD-10-CM | POA: Diagnosis not present

## 2018-04-18 DIAGNOSIS — M1990 Unspecified osteoarthritis, unspecified site: Secondary | ICD-10-CM | POA: Diagnosis not present

## 2018-04-18 DIAGNOSIS — K219 Gastro-esophageal reflux disease without esophagitis: Secondary | ICD-10-CM | POA: Diagnosis not present

## 2018-04-18 DIAGNOSIS — Z87891 Personal history of nicotine dependence: Secondary | ICD-10-CM | POA: Diagnosis not present

## 2018-04-19 ENCOUNTER — Ambulatory Visit: Payer: Medicare Other | Admitting: Physician Assistant

## 2018-04-19 DIAGNOSIS — I313 Pericardial effusion (noninflammatory): Secondary | ICD-10-CM | POA: Diagnosis not present

## 2018-04-19 DIAGNOSIS — C099 Malignant neoplasm of tonsil, unspecified: Secondary | ICD-10-CM | POA: Diagnosis not present

## 2018-04-19 DIAGNOSIS — I340 Nonrheumatic mitral (valve) insufficiency: Secondary | ICD-10-CM | POA: Diagnosis not present

## 2018-04-21 ENCOUNTER — Other Ambulatory Visit: Payer: Self-pay | Admitting: Family Medicine

## 2018-04-25 ENCOUNTER — Other Ambulatory Visit: Payer: Self-pay | Admitting: Family Medicine

## 2018-04-30 ENCOUNTER — Telehealth: Payer: Self-pay | Admitting: Physician Assistant

## 2018-04-30 NOTE — Telephone Encounter (Signed)
PT was scheduled for hosp f/u later this week with AJ the pt canceled due to not really wanting to come in here she states that she is f/u with the dr that put the port in and she is also going to f/u with the oncologist, she is wanting to know if she really should come in to see AJ for f/u if she is seeing all the other providers

## 2018-04-30 NOTE — Telephone Encounter (Signed)
Apt made

## 2018-04-30 NOTE — Telephone Encounter (Signed)
We can do phone visit for her, even some time middle of next week in order to see how she is doing at that point.  No she does not need to come in here.

## 2018-05-02 ENCOUNTER — Ambulatory Visit: Payer: Medicare Other | Admitting: Physician Assistant

## 2018-05-02 ENCOUNTER — Other Ambulatory Visit: Payer: Self-pay

## 2018-05-02 DIAGNOSIS — C099 Malignant neoplasm of tonsil, unspecified: Secondary | ICD-10-CM | POA: Diagnosis not present

## 2018-05-03 DIAGNOSIS — C099 Malignant neoplasm of tonsil, unspecified: Secondary | ICD-10-CM | POA: Diagnosis not present

## 2018-05-03 DIAGNOSIS — Z87891 Personal history of nicotine dependence: Secondary | ICD-10-CM | POA: Diagnosis not present

## 2018-05-03 DIAGNOSIS — M1991 Primary osteoarthritis, unspecified site: Secondary | ICD-10-CM | POA: Diagnosis not present

## 2018-05-03 DIAGNOSIS — D649 Anemia, unspecified: Secondary | ICD-10-CM | POA: Diagnosis not present

## 2018-05-03 DIAGNOSIS — I10 Essential (primary) hypertension: Secondary | ICD-10-CM | POA: Diagnosis not present

## 2018-05-03 DIAGNOSIS — Z431 Encounter for attention to gastrostomy: Secondary | ICD-10-CM | POA: Diagnosis not present

## 2018-05-03 DIAGNOSIS — Z7982 Long term (current) use of aspirin: Secondary | ICD-10-CM | POA: Diagnosis not present

## 2018-05-07 DIAGNOSIS — Z87891 Personal history of nicotine dependence: Secondary | ICD-10-CM | POA: Diagnosis not present

## 2018-05-07 DIAGNOSIS — Z7982 Long term (current) use of aspirin: Secondary | ICD-10-CM | POA: Diagnosis not present

## 2018-05-07 DIAGNOSIS — M1991 Primary osteoarthritis, unspecified site: Secondary | ICD-10-CM | POA: Diagnosis not present

## 2018-05-07 DIAGNOSIS — C099 Malignant neoplasm of tonsil, unspecified: Secondary | ICD-10-CM | POA: Diagnosis not present

## 2018-05-07 DIAGNOSIS — I10 Essential (primary) hypertension: Secondary | ICD-10-CM | POA: Diagnosis not present

## 2018-05-07 DIAGNOSIS — Z431 Encounter for attention to gastrostomy: Secondary | ICD-10-CM | POA: Diagnosis not present

## 2018-05-07 DIAGNOSIS — D649 Anemia, unspecified: Secondary | ICD-10-CM | POA: Diagnosis not present

## 2018-05-08 DIAGNOSIS — Z5111 Encounter for antineoplastic chemotherapy: Secondary | ICD-10-CM | POA: Diagnosis not present

## 2018-05-08 DIAGNOSIS — Z5112 Encounter for antineoplastic immunotherapy: Secondary | ICD-10-CM | POA: Diagnosis not present

## 2018-05-08 DIAGNOSIS — C099 Malignant neoplasm of tonsil, unspecified: Secondary | ICD-10-CM | POA: Diagnosis not present

## 2018-05-09 DIAGNOSIS — I10 Essential (primary) hypertension: Secondary | ICD-10-CM | POA: Diagnosis not present

## 2018-05-09 DIAGNOSIS — Z431 Encounter for attention to gastrostomy: Secondary | ICD-10-CM | POA: Diagnosis not present

## 2018-05-09 DIAGNOSIS — Z7982 Long term (current) use of aspirin: Secondary | ICD-10-CM | POA: Diagnosis not present

## 2018-05-09 DIAGNOSIS — Z87891 Personal history of nicotine dependence: Secondary | ICD-10-CM | POA: Diagnosis not present

## 2018-05-09 DIAGNOSIS — D649 Anemia, unspecified: Secondary | ICD-10-CM | POA: Diagnosis not present

## 2018-05-09 DIAGNOSIS — Z931 Gastrostomy status: Secondary | ICD-10-CM | POA: Diagnosis not present

## 2018-05-09 DIAGNOSIS — C099 Malignant neoplasm of tonsil, unspecified: Secondary | ICD-10-CM | POA: Diagnosis not present

## 2018-05-09 DIAGNOSIS — M1991 Primary osteoarthritis, unspecified site: Secondary | ICD-10-CM | POA: Diagnosis not present

## 2018-05-10 DIAGNOSIS — C099 Malignant neoplasm of tonsil, unspecified: Secondary | ICD-10-CM | POA: Diagnosis not present

## 2018-05-10 DIAGNOSIS — Z7689 Persons encountering health services in other specified circumstances: Secondary | ICD-10-CM | POA: Diagnosis not present

## 2018-05-11 ENCOUNTER — Ambulatory Visit (INDEPENDENT_AMBULATORY_CARE_PROVIDER_SITE_OTHER): Payer: Medicare Other

## 2018-05-11 ENCOUNTER — Other Ambulatory Visit: Payer: Self-pay

## 2018-05-11 DIAGNOSIS — Z7982 Long term (current) use of aspirin: Secondary | ICD-10-CM

## 2018-05-11 DIAGNOSIS — Z431 Encounter for attention to gastrostomy: Secondary | ICD-10-CM

## 2018-05-11 DIAGNOSIS — M1991 Primary osteoarthritis, unspecified site: Secondary | ICD-10-CM

## 2018-05-11 DIAGNOSIS — D649 Anemia, unspecified: Secondary | ICD-10-CM

## 2018-05-11 DIAGNOSIS — F329 Major depressive disorder, single episode, unspecified: Secondary | ICD-10-CM

## 2018-05-11 DIAGNOSIS — I10 Essential (primary) hypertension: Secondary | ICD-10-CM

## 2018-05-11 DIAGNOSIS — Z87891 Personal history of nicotine dependence: Secondary | ICD-10-CM

## 2018-05-11 DIAGNOSIS — C099 Malignant neoplasm of tonsil, unspecified: Secondary | ICD-10-CM | POA: Diagnosis not present

## 2018-05-11 DIAGNOSIS — F419 Anxiety disorder, unspecified: Secondary | ICD-10-CM

## 2018-05-14 DIAGNOSIS — Z09 Encounter for follow-up examination after completed treatment for conditions other than malignant neoplasm: Secondary | ICD-10-CM | POA: Diagnosis not present

## 2018-05-14 DIAGNOSIS — G893 Neoplasm related pain (acute) (chronic): Secondary | ICD-10-CM | POA: Diagnosis not present

## 2018-05-14 DIAGNOSIS — C099 Malignant neoplasm of tonsil, unspecified: Secondary | ICD-10-CM | POA: Diagnosis not present

## 2018-05-15 DIAGNOSIS — Z5111 Encounter for antineoplastic chemotherapy: Secondary | ICD-10-CM | POA: Diagnosis not present

## 2018-05-15 DIAGNOSIS — C099 Malignant neoplasm of tonsil, unspecified: Secondary | ICD-10-CM | POA: Diagnosis not present

## 2018-05-15 DIAGNOSIS — Z5112 Encounter for antineoplastic immunotherapy: Secondary | ICD-10-CM | POA: Diagnosis not present

## 2018-05-16 DIAGNOSIS — I10 Essential (primary) hypertension: Secondary | ICD-10-CM | POA: Diagnosis not present

## 2018-05-16 DIAGNOSIS — D649 Anemia, unspecified: Secondary | ICD-10-CM | POA: Diagnosis not present

## 2018-05-16 DIAGNOSIS — Z7982 Long term (current) use of aspirin: Secondary | ICD-10-CM | POA: Diagnosis not present

## 2018-05-16 DIAGNOSIS — C099 Malignant neoplasm of tonsil, unspecified: Secondary | ICD-10-CM | POA: Diagnosis not present

## 2018-05-16 DIAGNOSIS — Z87891 Personal history of nicotine dependence: Secondary | ICD-10-CM | POA: Diagnosis not present

## 2018-05-16 DIAGNOSIS — Z431 Encounter for attention to gastrostomy: Secondary | ICD-10-CM | POA: Diagnosis not present

## 2018-05-16 DIAGNOSIS — M1991 Primary osteoarthritis, unspecified site: Secondary | ICD-10-CM | POA: Diagnosis not present

## 2018-05-17 DIAGNOSIS — C099 Malignant neoplasm of tonsil, unspecified: Secondary | ICD-10-CM | POA: Diagnosis not present

## 2018-05-17 DIAGNOSIS — Z7689 Persons encountering health services in other specified circumstances: Secondary | ICD-10-CM | POA: Diagnosis not present

## 2018-05-18 DIAGNOSIS — Z7982 Long term (current) use of aspirin: Secondary | ICD-10-CM | POA: Diagnosis not present

## 2018-05-18 DIAGNOSIS — Z87891 Personal history of nicotine dependence: Secondary | ICD-10-CM | POA: Diagnosis not present

## 2018-05-18 DIAGNOSIS — Z7689 Persons encountering health services in other specified circumstances: Secondary | ICD-10-CM | POA: Diagnosis not present

## 2018-05-18 DIAGNOSIS — C099 Malignant neoplasm of tonsil, unspecified: Secondary | ICD-10-CM | POA: Diagnosis not present

## 2018-05-18 DIAGNOSIS — G893 Neoplasm related pain (acute) (chronic): Secondary | ICD-10-CM | POA: Diagnosis not present

## 2018-05-18 DIAGNOSIS — I10 Essential (primary) hypertension: Secondary | ICD-10-CM | POA: Diagnosis not present

## 2018-05-18 DIAGNOSIS — D649 Anemia, unspecified: Secondary | ICD-10-CM | POA: Diagnosis not present

## 2018-05-18 DIAGNOSIS — M1991 Primary osteoarthritis, unspecified site: Secondary | ICD-10-CM | POA: Diagnosis not present

## 2018-05-18 DIAGNOSIS — I313 Pericardial effusion (noninflammatory): Secondary | ICD-10-CM | POA: Diagnosis not present

## 2018-05-18 DIAGNOSIS — T304 Corrosion of unspecified body region, unspecified degree: Secondary | ICD-10-CM | POA: Diagnosis not present

## 2018-05-18 DIAGNOSIS — C091 Malignant neoplasm of tonsillar pillar (anterior) (posterior): Secondary | ICD-10-CM | POA: Diagnosis not present

## 2018-05-18 DIAGNOSIS — Z431 Encounter for attention to gastrostomy: Secondary | ICD-10-CM | POA: Diagnosis not present

## 2018-05-18 DIAGNOSIS — Z09 Encounter for follow-up examination after completed treatment for conditions other than malignant neoplasm: Secondary | ICD-10-CM | POA: Diagnosis not present

## 2018-05-21 ENCOUNTER — Other Ambulatory Visit: Payer: Self-pay | Admitting: Physician Assistant

## 2018-05-21 DIAGNOSIS — T304 Corrosion of unspecified body region, unspecified degree: Secondary | ICD-10-CM | POA: Diagnosis not present

## 2018-05-21 DIAGNOSIS — Z09 Encounter for follow-up examination after completed treatment for conditions other than malignant neoplasm: Secondary | ICD-10-CM | POA: Diagnosis not present

## 2018-05-21 DIAGNOSIS — C099 Malignant neoplasm of tonsil, unspecified: Secondary | ICD-10-CM | POA: Diagnosis not present

## 2018-05-21 DIAGNOSIS — G893 Neoplasm related pain (acute) (chronic): Secondary | ICD-10-CM | POA: Diagnosis not present

## 2018-05-21 MED ORDER — POTASSIUM CHLORIDE CRYS ER 20 MEQ PO TBCR: 20.0000 meq | EXTENDED_RELEASE_TABLET | Freq: Every day | ORAL | 3 refills | Status: DC

## 2018-05-21 MED ORDER — FOLIC ACID 1 MG PO TABS: 1.0000 mg | ORAL_TABLET | Freq: Every day | ORAL | 1 refills | Status: AC

## 2018-05-21 MED ORDER — ACYCLOVIR 400 MG PO TABS: 400.0000 mg | ORAL_TABLET | Freq: Every day | ORAL | 5 refills | Status: AC

## 2018-05-21 MED ORDER — PRAVASTATIN SODIUM 10 MG PO TABS: 10.0000 mg | ORAL_TABLET | Freq: Every day | ORAL | 3 refills | Status: AC

## 2018-05-21 MED ORDER — ZOLPIDEM TARTRATE 10 MG PO TABS: 10.0000 mg | ORAL_TABLET | Freq: Every day | ORAL | 2 refills | Status: DC

## 2018-05-21 MED ORDER — PANTOPRAZOLE SODIUM 40 MG PO TBEC: 40.0000 mg | DELAYED_RELEASE_TABLET | Freq: Two times a day (BID) | ORAL | 1 refills | Status: DC

## 2018-05-21 MED ORDER — AMLODIPINE BESYLATE 2.5 MG PO TABS: 2.5000 mg | ORAL_TABLET | Freq: Every day | ORAL | 1 refills | Status: AC

## 2018-05-21 MED ORDER — LINACLOTIDE 145 MCG PO CAPS: 145.0000 ug | ORAL_CAPSULE | Freq: Every day | ORAL | 5 refills | Status: AC

## 2018-05-21 MED ORDER — DULOXETINE HCL 30 MG PO CPEP: ORAL_CAPSULE | ORAL | 5 refills | Status: DC

## 2018-05-22 DIAGNOSIS — Z5112 Encounter for antineoplastic immunotherapy: Secondary | ICD-10-CM | POA: Diagnosis not present

## 2018-05-22 DIAGNOSIS — Z5111 Encounter for antineoplastic chemotherapy: Secondary | ICD-10-CM | POA: Diagnosis not present

## 2018-05-22 DIAGNOSIS — C099 Malignant neoplasm of tonsil, unspecified: Secondary | ICD-10-CM | POA: Diagnosis not present

## 2018-05-22 DIAGNOSIS — Z931 Gastrostomy status: Secondary | ICD-10-CM | POA: Diagnosis not present

## 2018-05-23 DIAGNOSIS — Z7982 Long term (current) use of aspirin: Secondary | ICD-10-CM | POA: Diagnosis not present

## 2018-05-23 DIAGNOSIS — D649 Anemia, unspecified: Secondary | ICD-10-CM | POA: Diagnosis not present

## 2018-05-23 DIAGNOSIS — C099 Malignant neoplasm of tonsil, unspecified: Secondary | ICD-10-CM | POA: Diagnosis not present

## 2018-05-23 DIAGNOSIS — M1991 Primary osteoarthritis, unspecified site: Secondary | ICD-10-CM | POA: Diagnosis not present

## 2018-05-23 DIAGNOSIS — Z87891 Personal history of nicotine dependence: Secondary | ICD-10-CM | POA: Diagnosis not present

## 2018-05-23 DIAGNOSIS — I10 Essential (primary) hypertension: Secondary | ICD-10-CM | POA: Diagnosis not present

## 2018-05-23 DIAGNOSIS — Z431 Encounter for attention to gastrostomy: Secondary | ICD-10-CM | POA: Diagnosis not present

## 2018-05-24 DIAGNOSIS — Z7689 Persons encountering health services in other specified circumstances: Secondary | ICD-10-CM | POA: Diagnosis not present

## 2018-05-24 DIAGNOSIS — C099 Malignant neoplasm of tonsil, unspecified: Secondary | ICD-10-CM | POA: Diagnosis not present

## 2018-05-24 DIAGNOSIS — C091 Malignant neoplasm of tonsillar pillar (anterior) (posterior): Secondary | ICD-10-CM | POA: Diagnosis not present

## 2018-05-24 DIAGNOSIS — Z95828 Presence of other vascular implants and grafts: Secondary | ICD-10-CM | POA: Diagnosis not present

## 2018-05-25 DIAGNOSIS — Z7689 Persons encountering health services in other specified circumstances: Secondary | ICD-10-CM | POA: Diagnosis not present

## 2018-05-25 DIAGNOSIS — C099 Malignant neoplasm of tonsil, unspecified: Secondary | ICD-10-CM | POA: Diagnosis not present

## 2018-05-28 DIAGNOSIS — G893 Neoplasm related pain (acute) (chronic): Secondary | ICD-10-CM | POA: Diagnosis not present

## 2018-05-28 DIAGNOSIS — C091 Malignant neoplasm of tonsillar pillar (anterior) (posterior): Secondary | ICD-10-CM | POA: Diagnosis not present

## 2018-05-28 DIAGNOSIS — C099 Malignant neoplasm of tonsil, unspecified: Secondary | ICD-10-CM | POA: Diagnosis not present

## 2018-05-28 DIAGNOSIS — D649 Anemia, unspecified: Secondary | ICD-10-CM | POA: Diagnosis not present

## 2018-05-28 DIAGNOSIS — E860 Dehydration: Secondary | ICD-10-CM | POA: Diagnosis not present

## 2018-05-28 DIAGNOSIS — I313 Pericardial effusion (noninflammatory): Secondary | ICD-10-CM | POA: Diagnosis not present

## 2018-05-29 DIAGNOSIS — C099 Malignant neoplasm of tonsil, unspecified: Secondary | ICD-10-CM | POA: Diagnosis not present

## 2018-05-29 DIAGNOSIS — Z95828 Presence of other vascular implants and grafts: Secondary | ICD-10-CM | POA: Diagnosis not present

## 2018-05-31 DIAGNOSIS — Z7982 Long term (current) use of aspirin: Secondary | ICD-10-CM | POA: Diagnosis not present

## 2018-05-31 DIAGNOSIS — C099 Malignant neoplasm of tonsil, unspecified: Secondary | ICD-10-CM | POA: Diagnosis not present

## 2018-05-31 DIAGNOSIS — Z431 Encounter for attention to gastrostomy: Secondary | ICD-10-CM | POA: Diagnosis not present

## 2018-05-31 DIAGNOSIS — D649 Anemia, unspecified: Secondary | ICD-10-CM | POA: Diagnosis not present

## 2018-05-31 DIAGNOSIS — Z87891 Personal history of nicotine dependence: Secondary | ICD-10-CM | POA: Diagnosis not present

## 2018-05-31 DIAGNOSIS — M1991 Primary osteoarthritis, unspecified site: Secondary | ICD-10-CM | POA: Diagnosis not present

## 2018-05-31 DIAGNOSIS — Z09 Encounter for follow-up examination after completed treatment for conditions other than malignant neoplasm: Secondary | ICD-10-CM | POA: Diagnosis not present

## 2018-05-31 DIAGNOSIS — I10 Essential (primary) hypertension: Secondary | ICD-10-CM | POA: Diagnosis not present

## 2018-05-31 DIAGNOSIS — T66XXXS Radiation sickness, unspecified, sequela: Secondary | ICD-10-CM | POA: Diagnosis not present

## 2018-05-31 DIAGNOSIS — G893 Neoplasm related pain (acute) (chronic): Secondary | ICD-10-CM | POA: Diagnosis not present

## 2018-06-04 DIAGNOSIS — Z09 Encounter for follow-up examination after completed treatment for conditions other than malignant neoplasm: Secondary | ICD-10-CM | POA: Diagnosis not present

## 2018-06-04 DIAGNOSIS — G893 Neoplasm related pain (acute) (chronic): Secondary | ICD-10-CM | POA: Diagnosis not present

## 2018-06-04 DIAGNOSIS — C099 Malignant neoplasm of tonsil, unspecified: Secondary | ICD-10-CM | POA: Diagnosis not present

## 2018-06-04 DIAGNOSIS — Z95828 Presence of other vascular implants and grafts: Secondary | ICD-10-CM | POA: Diagnosis not present

## 2018-06-04 DIAGNOSIS — T66XXXS Radiation sickness, unspecified, sequela: Secondary | ICD-10-CM | POA: Diagnosis not present

## 2018-06-05 DIAGNOSIS — Z5112 Encounter for antineoplastic immunotherapy: Secondary | ICD-10-CM | POA: Diagnosis not present

## 2018-06-05 DIAGNOSIS — C099 Malignant neoplasm of tonsil, unspecified: Secondary | ICD-10-CM | POA: Diagnosis not present

## 2018-06-11 DIAGNOSIS — C099 Malignant neoplasm of tonsil, unspecified: Secondary | ICD-10-CM | POA: Diagnosis not present

## 2018-06-11 DIAGNOSIS — Z09 Encounter for follow-up examination after completed treatment for conditions other than malignant neoplasm: Secondary | ICD-10-CM | POA: Diagnosis not present

## 2018-06-11 DIAGNOSIS — G893 Neoplasm related pain (acute) (chronic): Secondary | ICD-10-CM | POA: Diagnosis not present

## 2018-06-11 DIAGNOSIS — T451X5A Adverse effect of antineoplastic and immunosuppressive drugs, initial encounter: Secondary | ICD-10-CM | POA: Diagnosis not present

## 2018-06-11 DIAGNOSIS — K521 Toxic gastroenteritis and colitis: Secondary | ICD-10-CM | POA: Diagnosis not present

## 2018-06-11 DIAGNOSIS — T66XXXS Radiation sickness, unspecified, sequela: Secondary | ICD-10-CM | POA: Diagnosis not present

## 2018-06-12 DIAGNOSIS — Z5111 Encounter for antineoplastic chemotherapy: Secondary | ICD-10-CM | POA: Diagnosis not present

## 2018-06-12 DIAGNOSIS — C091 Malignant neoplasm of tonsillar pillar (anterior) (posterior): Secondary | ICD-10-CM | POA: Diagnosis not present

## 2018-06-12 DIAGNOSIS — Z95828 Presence of other vascular implants and grafts: Secondary | ICD-10-CM | POA: Diagnosis not present

## 2018-06-12 DIAGNOSIS — C099 Malignant neoplasm of tonsil, unspecified: Secondary | ICD-10-CM | POA: Diagnosis not present

## 2018-06-12 DIAGNOSIS — I313 Pericardial effusion (noninflammatory): Secondary | ICD-10-CM | POA: Diagnosis not present

## 2018-06-13 DIAGNOSIS — I313 Pericardial effusion (noninflammatory): Secondary | ICD-10-CM | POA: Diagnosis not present

## 2018-06-13 DIAGNOSIS — C099 Malignant neoplasm of tonsil, unspecified: Secondary | ICD-10-CM | POA: Diagnosis not present

## 2018-06-14 DIAGNOSIS — I313 Pericardial effusion (noninflammatory): Secondary | ICD-10-CM | POA: Diagnosis not present

## 2018-06-14 DIAGNOSIS — C099 Malignant neoplasm of tonsil, unspecified: Secondary | ICD-10-CM | POA: Diagnosis not present

## 2018-06-15 DIAGNOSIS — I313 Pericardial effusion (noninflammatory): Secondary | ICD-10-CM | POA: Diagnosis not present

## 2018-06-15 DIAGNOSIS — C099 Malignant neoplasm of tonsil, unspecified: Secondary | ICD-10-CM | POA: Diagnosis not present

## 2018-06-19 DIAGNOSIS — C099 Malignant neoplasm of tonsil, unspecified: Secondary | ICD-10-CM | POA: Diagnosis not present

## 2018-06-19 DIAGNOSIS — Z5111 Encounter for antineoplastic chemotherapy: Secondary | ICD-10-CM | POA: Diagnosis not present

## 2018-06-19 DIAGNOSIS — I313 Pericardial effusion (noninflammatory): Secondary | ICD-10-CM | POA: Diagnosis not present

## 2018-06-20 DIAGNOSIS — Z95828 Presence of other vascular implants and grafts: Secondary | ICD-10-CM | POA: Diagnosis not present

## 2018-06-20 DIAGNOSIS — T66XXXS Radiation sickness, unspecified, sequela: Secondary | ICD-10-CM | POA: Diagnosis not present

## 2018-06-20 DIAGNOSIS — C099 Malignant neoplasm of tonsil, unspecified: Secondary | ICD-10-CM | POA: Diagnosis not present

## 2018-06-20 DIAGNOSIS — G893 Neoplasm related pain (acute) (chronic): Secondary | ICD-10-CM | POA: Diagnosis not present

## 2018-06-20 DIAGNOSIS — Z09 Encounter for follow-up examination after completed treatment for conditions other than malignant neoplasm: Secondary | ICD-10-CM | POA: Diagnosis not present

## 2018-06-21 DIAGNOSIS — C099 Malignant neoplasm of tonsil, unspecified: Secondary | ICD-10-CM | POA: Diagnosis not present

## 2018-06-21 DIAGNOSIS — C091 Malignant neoplasm of tonsillar pillar (anterior) (posterior): Secondary | ICD-10-CM | POA: Diagnosis not present

## 2018-06-21 DIAGNOSIS — I313 Pericardial effusion (noninflammatory): Secondary | ICD-10-CM | POA: Diagnosis not present

## 2018-06-22 DIAGNOSIS — I313 Pericardial effusion (noninflammatory): Secondary | ICD-10-CM | POA: Diagnosis not present

## 2018-06-22 DIAGNOSIS — C099 Malignant neoplasm of tonsil, unspecified: Secondary | ICD-10-CM | POA: Diagnosis not present

## 2018-06-25 DIAGNOSIS — Z09 Encounter for follow-up examination after completed treatment for conditions other than malignant neoplasm: Secondary | ICD-10-CM | POA: Diagnosis not present

## 2018-06-25 DIAGNOSIS — C099 Malignant neoplasm of tonsil, unspecified: Secondary | ICD-10-CM | POA: Diagnosis not present

## 2018-06-25 DIAGNOSIS — Z510 Encounter for antineoplastic radiation therapy: Secondary | ICD-10-CM | POA: Diagnosis not present

## 2018-06-25 DIAGNOSIS — G893 Neoplasm related pain (acute) (chronic): Secondary | ICD-10-CM | POA: Diagnosis not present

## 2018-06-25 DIAGNOSIS — T66XXXS Radiation sickness, unspecified, sequela: Secondary | ICD-10-CM | POA: Diagnosis not present

## 2018-06-25 DIAGNOSIS — C091 Malignant neoplasm of tonsillar pillar (anterior) (posterior): Secondary | ICD-10-CM | POA: Diagnosis not present

## 2018-06-26 DIAGNOSIS — Z5112 Encounter for antineoplastic immunotherapy: Secondary | ICD-10-CM | POA: Diagnosis not present

## 2018-06-26 DIAGNOSIS — C099 Malignant neoplasm of tonsil, unspecified: Secondary | ICD-10-CM | POA: Diagnosis not present

## 2018-06-27 DIAGNOSIS — G893 Neoplasm related pain (acute) (chronic): Secondary | ICD-10-CM | POA: Diagnosis not present

## 2018-06-27 DIAGNOSIS — Z510 Encounter for antineoplastic radiation therapy: Secondary | ICD-10-CM | POA: Diagnosis not present

## 2018-06-27 DIAGNOSIS — T66XXXS Radiation sickness, unspecified, sequela: Secondary | ICD-10-CM | POA: Diagnosis not present

## 2018-06-27 DIAGNOSIS — C099 Malignant neoplasm of tonsil, unspecified: Secondary | ICD-10-CM | POA: Diagnosis not present

## 2018-06-27 DIAGNOSIS — C091 Malignant neoplasm of tonsillar pillar (anterior) (posterior): Secondary | ICD-10-CM | POA: Diagnosis not present

## 2018-06-28 DIAGNOSIS — T66XXXS Radiation sickness, unspecified, sequela: Secondary | ICD-10-CM | POA: Diagnosis not present

## 2018-06-28 DIAGNOSIS — C099 Malignant neoplasm of tonsil, unspecified: Secondary | ICD-10-CM | POA: Diagnosis not present

## 2018-06-28 DIAGNOSIS — G893 Neoplasm related pain (acute) (chronic): Secondary | ICD-10-CM | POA: Diagnosis not present

## 2018-06-28 DIAGNOSIS — C091 Malignant neoplasm of tonsillar pillar (anterior) (posterior): Secondary | ICD-10-CM | POA: Diagnosis not present

## 2018-06-28 DIAGNOSIS — Z510 Encounter for antineoplastic radiation therapy: Secondary | ICD-10-CM | POA: Diagnosis not present

## 2018-06-29 DIAGNOSIS — C091 Malignant neoplasm of tonsillar pillar (anterior) (posterior): Secondary | ICD-10-CM | POA: Diagnosis not present

## 2018-06-29 DIAGNOSIS — G893 Neoplasm related pain (acute) (chronic): Secondary | ICD-10-CM | POA: Diagnosis not present

## 2018-06-29 DIAGNOSIS — Z510 Encounter for antineoplastic radiation therapy: Secondary | ICD-10-CM | POA: Diagnosis not present

## 2018-06-29 DIAGNOSIS — T66XXXS Radiation sickness, unspecified, sequela: Secondary | ICD-10-CM | POA: Diagnosis not present

## 2018-06-29 DIAGNOSIS — C099 Malignant neoplasm of tonsil, unspecified: Secondary | ICD-10-CM | POA: Diagnosis not present

## 2018-07-02 DIAGNOSIS — R109 Unspecified abdominal pain: Secondary | ICD-10-CM | POA: Diagnosis not present

## 2018-07-02 DIAGNOSIS — K9423 Gastrostomy malfunction: Secondary | ICD-10-CM | POA: Diagnosis not present

## 2018-07-02 DIAGNOSIS — Z431 Encounter for attention to gastrostomy: Secondary | ICD-10-CM | POA: Diagnosis not present

## 2018-07-02 DIAGNOSIS — C099 Malignant neoplasm of tonsil, unspecified: Secondary | ICD-10-CM | POA: Diagnosis not present

## 2018-07-02 DIAGNOSIS — Z4659 Encounter for fitting and adjustment of other gastrointestinal appliance and device: Secondary | ICD-10-CM | POA: Diagnosis not present

## 2018-07-03 DIAGNOSIS — Z510 Encounter for antineoplastic radiation therapy: Secondary | ICD-10-CM | POA: Diagnosis not present

## 2018-07-03 DIAGNOSIS — T66XXXS Radiation sickness, unspecified, sequela: Secondary | ICD-10-CM | POA: Diagnosis not present

## 2018-07-03 DIAGNOSIS — C091 Malignant neoplasm of tonsillar pillar (anterior) (posterior): Secondary | ICD-10-CM | POA: Diagnosis not present

## 2018-07-03 DIAGNOSIS — C099 Malignant neoplasm of tonsil, unspecified: Secondary | ICD-10-CM | POA: Diagnosis not present

## 2018-07-03 DIAGNOSIS — G893 Neoplasm related pain (acute) (chronic): Secondary | ICD-10-CM | POA: Diagnosis not present

## 2018-07-04 DIAGNOSIS — Z09 Encounter for follow-up examination after completed treatment for conditions other than malignant neoplasm: Secondary | ICD-10-CM | POA: Diagnosis not present

## 2018-07-04 DIAGNOSIS — G893 Neoplasm related pain (acute) (chronic): Secondary | ICD-10-CM | POA: Diagnosis not present

## 2018-07-04 DIAGNOSIS — L03012 Cellulitis of left finger: Secondary | ICD-10-CM | POA: Diagnosis not present

## 2018-07-04 DIAGNOSIS — Z95828 Presence of other vascular implants and grafts: Secondary | ICD-10-CM | POA: Diagnosis not present

## 2018-07-04 DIAGNOSIS — C099 Malignant neoplasm of tonsil, unspecified: Secondary | ICD-10-CM | POA: Diagnosis not present

## 2018-07-04 DIAGNOSIS — Z5111 Encounter for antineoplastic chemotherapy: Secondary | ICD-10-CM | POA: Diagnosis not present

## 2018-07-04 DIAGNOSIS — K9423 Gastrostomy malfunction: Secondary | ICD-10-CM | POA: Diagnosis not present

## 2018-07-05 DIAGNOSIS — C091 Malignant neoplasm of tonsillar pillar (anterior) (posterior): Secondary | ICD-10-CM | POA: Diagnosis not present

## 2018-07-05 DIAGNOSIS — G893 Neoplasm related pain (acute) (chronic): Secondary | ICD-10-CM | POA: Diagnosis not present

## 2018-07-05 DIAGNOSIS — Z510 Encounter for antineoplastic radiation therapy: Secondary | ICD-10-CM | POA: Diagnosis not present

## 2018-07-05 DIAGNOSIS — C099 Malignant neoplasm of tonsil, unspecified: Secondary | ICD-10-CM | POA: Diagnosis not present

## 2018-07-05 DIAGNOSIS — T66XXXS Radiation sickness, unspecified, sequela: Secondary | ICD-10-CM | POA: Diagnosis not present

## 2018-07-06 DIAGNOSIS — K9423 Gastrostomy malfunction: Secondary | ICD-10-CM | POA: Diagnosis not present

## 2018-07-06 DIAGNOSIS — C099 Malignant neoplasm of tonsil, unspecified: Secondary | ICD-10-CM | POA: Diagnosis not present

## 2018-07-09 DIAGNOSIS — Z7982 Long term (current) use of aspirin: Secondary | ICD-10-CM | POA: Diagnosis not present

## 2018-07-09 DIAGNOSIS — C099 Malignant neoplasm of tonsil, unspecified: Secondary | ICD-10-CM | POA: Diagnosis not present

## 2018-07-09 DIAGNOSIS — Z09 Encounter for follow-up examination after completed treatment for conditions other than malignant neoplasm: Secondary | ICD-10-CM | POA: Diagnosis not present

## 2018-07-09 DIAGNOSIS — L03012 Cellulitis of left finger: Secondary | ICD-10-CM | POA: Diagnosis not present

## 2018-07-09 DIAGNOSIS — Z96653 Presence of artificial knee joint, bilateral: Secondary | ICD-10-CM | POA: Diagnosis not present

## 2018-07-09 DIAGNOSIS — G893 Neoplasm related pain (acute) (chronic): Secondary | ICD-10-CM | POA: Diagnosis not present

## 2018-07-09 DIAGNOSIS — Z888 Allergy status to other drugs, medicaments and biological substances status: Secondary | ICD-10-CM | POA: Diagnosis not present

## 2018-07-09 DIAGNOSIS — I10 Essential (primary) hypertension: Secondary | ICD-10-CM | POA: Diagnosis not present

## 2018-07-09 DIAGNOSIS — K9423 Gastrostomy malfunction: Secondary | ICD-10-CM | POA: Diagnosis not present

## 2018-07-09 DIAGNOSIS — Z9049 Acquired absence of other specified parts of digestive tract: Secondary | ICD-10-CM | POA: Diagnosis not present

## 2018-07-09 DIAGNOSIS — Z923 Personal history of irradiation: Secondary | ICD-10-CM | POA: Diagnosis not present

## 2018-07-09 DIAGNOSIS — K219 Gastro-esophageal reflux disease without esophagitis: Secondary | ICD-10-CM | POA: Diagnosis not present

## 2018-07-09 DIAGNOSIS — T66XXXS Radiation sickness, unspecified, sequela: Secondary | ICD-10-CM | POA: Diagnosis not present

## 2018-07-09 DIAGNOSIS — Z882 Allergy status to sulfonamides status: Secondary | ICD-10-CM | POA: Diagnosis not present

## 2018-07-10 DIAGNOSIS — C099 Malignant neoplasm of tonsil, unspecified: Secondary | ICD-10-CM | POA: Diagnosis not present

## 2018-07-10 DIAGNOSIS — C091 Malignant neoplasm of tonsillar pillar (anterior) (posterior): Secondary | ICD-10-CM | POA: Diagnosis not present

## 2018-07-10 DIAGNOSIS — Z510 Encounter for antineoplastic radiation therapy: Secondary | ICD-10-CM | POA: Diagnosis not present

## 2018-07-10 DIAGNOSIS — G893 Neoplasm related pain (acute) (chronic): Secondary | ICD-10-CM | POA: Diagnosis not present

## 2018-07-10 DIAGNOSIS — T66XXXS Radiation sickness, unspecified, sequela: Secondary | ICD-10-CM | POA: Diagnosis not present

## 2018-07-12 DIAGNOSIS — G893 Neoplasm related pain (acute) (chronic): Secondary | ICD-10-CM | POA: Diagnosis not present

## 2018-07-12 DIAGNOSIS — T66XXXS Radiation sickness, unspecified, sequela: Secondary | ICD-10-CM | POA: Diagnosis not present

## 2018-07-12 DIAGNOSIS — Z510 Encounter for antineoplastic radiation therapy: Secondary | ICD-10-CM | POA: Diagnosis not present

## 2018-07-12 DIAGNOSIS — C091 Malignant neoplasm of tonsillar pillar (anterior) (posterior): Secondary | ICD-10-CM | POA: Diagnosis not present

## 2018-07-12 DIAGNOSIS — C099 Malignant neoplasm of tonsil, unspecified: Secondary | ICD-10-CM | POA: Diagnosis not present

## 2018-07-13 DIAGNOSIS — C099 Malignant neoplasm of tonsil, unspecified: Secondary | ICD-10-CM | POA: Diagnosis not present

## 2018-07-13 DIAGNOSIS — C091 Malignant neoplasm of tonsillar pillar (anterior) (posterior): Secondary | ICD-10-CM | POA: Diagnosis not present

## 2018-07-16 ENCOUNTER — Ambulatory Visit (INDEPENDENT_AMBULATORY_CARE_PROVIDER_SITE_OTHER): Payer: Medicare Other | Admitting: Physician Assistant

## 2018-07-16 ENCOUNTER — Other Ambulatory Visit: Payer: Self-pay

## 2018-07-16 DIAGNOSIS — D490 Neoplasm of unspecified behavior of digestive system: Secondary | ICD-10-CM

## 2018-07-16 DIAGNOSIS — C099 Malignant neoplasm of tonsil, unspecified: Secondary | ICD-10-CM | POA: Diagnosis not present

## 2018-07-16 DIAGNOSIS — Z09 Encounter for follow-up examination after completed treatment for conditions other than malignant neoplasm: Secondary | ICD-10-CM | POA: Diagnosis not present

## 2018-07-16 DIAGNOSIS — E860 Dehydration: Secondary | ICD-10-CM | POA: Diagnosis not present

## 2018-07-16 DIAGNOSIS — F5101 Primary insomnia: Secondary | ICD-10-CM

## 2018-07-16 DIAGNOSIS — S0083XA Contusion of other part of head, initial encounter: Secondary | ICD-10-CM | POA: Diagnosis not present

## 2018-07-16 DIAGNOSIS — S06890D Other specified intracranial injury without loss of consciousness, subsequent encounter: Secondary | ICD-10-CM | POA: Diagnosis not present

## 2018-07-16 DIAGNOSIS — K9423 Gastrostomy malfunction: Secondary | ICD-10-CM | POA: Diagnosis not present

## 2018-07-16 DIAGNOSIS — Z95828 Presence of other vascular implants and grafts: Secondary | ICD-10-CM | POA: Diagnosis not present

## 2018-07-16 MED ORDER — ZOLPIDEM TARTRATE 10 MG PO TABS: 10.0000 mg | ORAL_TABLET | Freq: Every day | ORAL | 2 refills | Status: AC

## 2018-07-17 ENCOUNTER — Encounter: Payer: Self-pay | Admitting: Physician Assistant

## 2018-07-17 DIAGNOSIS — C099 Malignant neoplasm of tonsil, unspecified: Secondary | ICD-10-CM | POA: Diagnosis not present

## 2018-07-17 DIAGNOSIS — Z510 Encounter for antineoplastic radiation therapy: Secondary | ICD-10-CM | POA: Diagnosis not present

## 2018-07-17 DIAGNOSIS — R51 Headache: Secondary | ICD-10-CM | POA: Diagnosis not present

## 2018-07-17 DIAGNOSIS — G893 Neoplasm related pain (acute) (chronic): Secondary | ICD-10-CM | POA: Diagnosis not present

## 2018-07-17 DIAGNOSIS — C091 Malignant neoplasm of tonsillar pillar (anterior) (posterior): Secondary | ICD-10-CM | POA: Diagnosis not present

## 2018-07-17 DIAGNOSIS — S06890D Other specified intracranial injury without loss of consciousness, subsequent encounter: Secondary | ICD-10-CM | POA: Diagnosis not present

## 2018-07-17 DIAGNOSIS — S0083XA Contusion of other part of head, initial encounter: Secondary | ICD-10-CM | POA: Diagnosis not present

## 2018-07-17 DIAGNOSIS — T66XXXS Radiation sickness, unspecified, sequela: Secondary | ICD-10-CM | POA: Diagnosis not present

## 2018-07-17 NOTE — Progress Notes (Signed)
Telephone visit  Subjective: XA:JOINOMV chronic conditions PCP: Terald Sleeper, PA-C EHM:CNOBS Plaster Gruhn is a 78 y.o. female calls for telephone consult today. Patient provides verbal consent for consult held via phone.  Patient is identified with 2 separate identifiers.  At this time the entire area is on COVID-19 social distancing and stay home orders are in place.  Patient is of higher risk and therefore we are performing this by a virtual method.  Location of patient: [place], [place] Location of provider: [facility] Others present for call: son, who is POA handled the call.  She had gotten dehydrated.  She was getting fluids by IV at the time of the call.  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  She will be having a referral to oncology at [facility].  She is having difficulty with her PEG tube and that is why she is not getting her nutrition in.  There can have to be working on all of these things.  She is not have any difficulties with any medicines that we can for.  She does need a refill on her Ambien.  We will send the prescription in.  And we will plan to see her back as needed or do phone visits as needed.   ROS: Per HPI  Allergies  Allergen Reactions  . Bupropion Other (See Comments)    Lips swelled  . Sulfa Antibiotics Itching and Rash  . Benadryl [Diphenhydramine Hcl (Sleep)] Other (See Comments)    Insomnia   . Doxycycline Nausea And Vomiting    Dizziness   . Gabapentin Other (See Comments)    Drunk feeling, staggering  . Desyrel [Trazodone] Other (See Comments)    Keeps me up all night   Past Medical History:  Diagnosis Date  . Allergy to meropenem   . Anemia   . Arthritis   . 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 Carotid artery occlusion 05/11/09   Bruit - Right  . Depression 2011  . Hyperlipidemia   . Hypertension     Current Outpatient Medications:  .  acyclovir (ZOVIRAX) 400 MG tablet, Take 1 tablet (400 mg total) by  mouth daily., Disp: 30 tablet, Rfl: 5 .  amLODipine (NORVASC) 2.5 MG tablet, Take 1 tablet (2.5 mg total) by mouth daily., Disp: 90 tablet, Rfl: 1 .  aspirin 81 MG tablet, Take 81 mg by mouth daily., Disp: , Rfl:  .  Cholecalciferol (VITAMIN D3) 3000 UNITS TABS, Take 1 tablet by mouth daily. , Disp: , Rfl:  .  Cyanocobalamin (VITAMIN B 12 PO), Take 1 tablet by mouth daily. , Disp: , Rfl:  .  DULoxetine (CYMBALTA) 30 MG capsule, TAKE 1 CAPSULE BY MOUTH EVERY DAY, Disp: 30 capsule, Rfl: 5 .  folic acid (FOLVITE) 1 MG tablet, Take 1 tablet (1 mg total) by mouth daily., Disp: 90 tablet, Rfl: 1 .  linaclotide (LINZESS) 145 MCG CAPS capsule, Take 1 capsule (145 mcg total) by mouth daily. To regulate bowel movements, Disp: 30 capsule, Rfl: 5 .  pantoprazole (PROTONIX) 40 MG tablet, Take 1 tablet (40 mg total) by mouth 2 (two) times daily., Disp: 180 tablet, Rfl: 1 .  potassium chloride SA (K-DUR) 20 MEQ tablet, Take 1 tablet (20 mEq total) by mouth daily., Disp: 90 tablet, Rfl: 3 .  pravastatin (PRAVACHOL) 10 MG tablet, Take 1 tablet (10 mg total) by mouth daily., Disp: 90 tablet, Rfl: 3 .  zolpidem (AMBIEN)  10 MG tablet, Take 1 tablet (10 mg total) by mouth at bedtime., Disp: 30 tablet, Rfl: 2  Assessment/ Plan: 78 y.o. female   1. Primary insomnia - zolpidem (AMBIEN) 10 MG tablet; Take 1 tablet (10 mg total) by mouth at bedtime.  Dispense: 30 tablet; Refill: 2  2. Tonsil neoplasm Continue oncology treatment   Continue all other maintenance medications as listed above.  Start time: 11:17 AM End time: 11:29 AM  Meds ordered this encounter  Medications  . zolpidem (AMBIEN) 10 MG tablet    Sig: Take 1 tablet (10 mg total) by mouth at bedtime.    Dispense:  30 tablet    Refill:  2    This request is for a new prescription for a controlled substance as required by Federal/State law.    Order Specific Question:   Supervising Provider    Answer:   Janora Norlander [[id number]]    Particia Nearing PA-C [place] [phone]

## 2018-07-18 DIAGNOSIS — R5383 Other fatigue: Secondary | ICD-10-CM | POA: Diagnosis not present

## 2018-07-18 DIAGNOSIS — J029 Acute pharyngitis, unspecified: Secondary | ICD-10-CM | POA: Diagnosis not present

## 2018-07-18 DIAGNOSIS — Z1159 Encounter for screening for other viral diseases: Secondary | ICD-10-CM | POA: Diagnosis not present

## 2018-07-20 DIAGNOSIS — R1310 Dysphagia, unspecified: Secondary | ICD-10-CM | POA: Diagnosis not present

## 2018-07-20 DIAGNOSIS — K316 Fistula of stomach and duodenum: Secondary | ICD-10-CM | POA: Diagnosis not present

## 2018-07-20 DIAGNOSIS — K219 Gastro-esophageal reflux disease without esophagitis: Secondary | ICD-10-CM | POA: Diagnosis not present

## 2018-07-20 DIAGNOSIS — Z431 Encounter for attention to gastrostomy: Secondary | ICD-10-CM | POA: Diagnosis not present

## 2018-07-20 DIAGNOSIS — R633 Feeding difficulties: Secondary | ICD-10-CM | POA: Diagnosis not present

## 2018-07-20 DIAGNOSIS — I10 Essential (primary) hypertension: Secondary | ICD-10-CM | POA: Diagnosis not present

## 2018-07-20 DIAGNOSIS — Z888 Allergy status to other drugs, medicaments and biological substances status: Secondary | ICD-10-CM | POA: Diagnosis not present

## 2018-07-20 DIAGNOSIS — Z96653 Presence of artificial knee joint, bilateral: Secondary | ICD-10-CM | POA: Diagnosis not present

## 2018-07-20 DIAGNOSIS — Z6823 Body mass index (BMI) 23.0-23.9, adult: Secondary | ICD-10-CM | POA: Diagnosis not present

## 2018-07-20 DIAGNOSIS — C328 Malignant neoplasm of overlapping sites of larynx: Secondary | ICD-10-CM | POA: Diagnosis not present

## 2018-07-20 DIAGNOSIS — Z882 Allergy status to sulfonamides status: Secondary | ICD-10-CM | POA: Diagnosis not present

## 2018-07-20 DIAGNOSIS — E46 Unspecified protein-calorie malnutrition: Secondary | ICD-10-CM | POA: Diagnosis not present

## 2018-07-20 DIAGNOSIS — C329 Malignant neoplasm of larynx, unspecified: Secondary | ICD-10-CM | POA: Diagnosis not present

## 2018-07-23 DIAGNOSIS — L03012 Cellulitis of left finger: Secondary | ICD-10-CM | POA: Diagnosis not present

## 2018-07-23 DIAGNOSIS — K219 Gastro-esophageal reflux disease without esophagitis: Secondary | ICD-10-CM | POA: Diagnosis not present

## 2018-07-23 DIAGNOSIS — T66XXXS Radiation sickness, unspecified, sequela: Secondary | ICD-10-CM | POA: Diagnosis not present

## 2018-07-23 DIAGNOSIS — I10 Essential (primary) hypertension: Secondary | ICD-10-CM | POA: Diagnosis not present

## 2018-07-23 DIAGNOSIS — K9423 Gastrostomy malfunction: Secondary | ICD-10-CM | POA: Diagnosis not present

## 2018-07-23 DIAGNOSIS — I313 Pericardial effusion (noninflammatory): Secondary | ICD-10-CM | POA: Diagnosis not present

## 2018-07-23 DIAGNOSIS — I2510 Atherosclerotic heart disease of native coronary artery without angina pectoris: Secondary | ICD-10-CM | POA: Diagnosis not present

## 2018-07-23 DIAGNOSIS — Z888 Allergy status to other drugs, medicaments and biological substances status: Secondary | ICD-10-CM | POA: Diagnosis not present

## 2018-07-23 DIAGNOSIS — E860 Dehydration: Secondary | ICD-10-CM | POA: Diagnosis not present

## 2018-07-23 DIAGNOSIS — Z882 Allergy status to sulfonamides status: Secondary | ICD-10-CM | POA: Diagnosis not present

## 2018-07-23 DIAGNOSIS — Z09 Encounter for follow-up examination after completed treatment for conditions other than malignant neoplasm: Secondary | ICD-10-CM | POA: Diagnosis not present

## 2018-07-23 DIAGNOSIS — C099 Malignant neoplasm of tonsil, unspecified: Secondary | ICD-10-CM | POA: Diagnosis not present

## 2018-07-23 DIAGNOSIS — I700 Atherosclerosis of aorta: Secondary | ICD-10-CM | POA: Diagnosis not present

## 2018-07-23 DIAGNOSIS — Z7982 Long term (current) use of aspirin: Secondary | ICD-10-CM | POA: Diagnosis not present

## 2018-07-26 DIAGNOSIS — G893 Neoplasm related pain (acute) (chronic): Secondary | ICD-10-CM | POA: Diagnosis not present

## 2018-07-26 DIAGNOSIS — C099 Malignant neoplasm of tonsil, unspecified: Secondary | ICD-10-CM | POA: Diagnosis not present

## 2018-07-26 DIAGNOSIS — K9423 Gastrostomy malfunction: Secondary | ICD-10-CM | POA: Diagnosis not present

## 2018-07-26 DIAGNOSIS — Z515 Encounter for palliative care: Secondary | ICD-10-CM | POA: Diagnosis not present

## 2018-07-26 DIAGNOSIS — T66XXXS Radiation sickness, unspecified, sequela: Secondary | ICD-10-CM | POA: Diagnosis not present

## 2018-07-26 DIAGNOSIS — Z09 Encounter for follow-up examination after completed treatment for conditions other than malignant neoplasm: Secondary | ICD-10-CM | POA: Diagnosis not present

## 2018-08-09 ENCOUNTER — Other Ambulatory Visit: Payer: Self-pay | Admitting: *Deleted

## 2018-08-09 ENCOUNTER — Telehealth: Payer: Self-pay | Admitting: *Deleted

## 2018-08-09 ENCOUNTER — Other Ambulatory Visit: Payer: Self-pay | Admitting: Physician Assistant

## 2018-08-09 MED ORDER — NYSTATIN 100000 UNIT/GM EX CREA: 1.0000 "application " | TOPICAL_CREAM | Freq: Two times a day (BID) | CUTANEOUS | 5 refills | Status: AC

## 2018-08-09 NOTE — Telephone Encounter (Signed)
Where do I send?

## 2018-08-09 NOTE — Telephone Encounter (Signed)
[facility] °

## 2018-08-09 NOTE — Telephone Encounter (Signed)
How is she using it, I cannot print a script without the instructions. Or do I just wait for tomorrow to write it?

## 2018-08-09 NOTE — Telephone Encounter (Signed)
Vm from [place] w/ Hospice Would like Rx for Nystatin cream Pt would like to discontinued Potassium, is having a hard time swallowing, and not able to crush & put in peg tube Please advise

## 2018-08-10 MED ORDER — FENTANYL 25 MCG/HR TD PT72: 1.0000 | MEDICATED_PATCH | TRANSDERMAL | 0 refills | Status: AC

## 2018-09-03 DIAGNOSIS — C099 Malignant neoplasm of tonsil, unspecified: Secondary | ICD-10-CM | POA: Diagnosis not present

## 2018-09-03 DIAGNOSIS — Z931 Gastrostomy status: Secondary | ICD-10-CM | POA: Diagnosis not present

## 2018-09-17 ENCOUNTER — Other Ambulatory Visit: Payer: Self-pay | Admitting: Physician Assistant

## 2018-09-17 ENCOUNTER — Telehealth: Payer: Self-pay | Admitting: *Deleted

## 2018-09-17 MED ORDER — NYSTATIN 100000 UNIT/ML MT SUSP: 5.0000 mL | Freq: Four times a day (QID) | OROMUCOSAL | 0 refills | Status: AC

## 2018-09-17 NOTE — Telephone Encounter (Signed)
VM from [place] w/ Hospice Pt c/o mouth & throat pain Would like order for Magic Mouthwash Pt's allergy to Benadryl is it just has the opposite effect at night Please advise

## 2018-09-17 NOTE — Telephone Encounter (Signed)
Sent nystatin to [facility], is that okay?

## 2018-09-18 NOTE — Telephone Encounter (Signed)
Pt is already on nystatin. The hospice nurse called their NP and had the magic mouthwash called in.

## 2018-09-26 ENCOUNTER — Other Ambulatory Visit: Payer: Self-pay | Admitting: Physician Assistant

## 2018-09-26 ENCOUNTER — Other Ambulatory Visit: Payer: Self-pay | Admitting: *Deleted

## 2018-09-26 MED ORDER — CLOTRIMAZOLE 1 % EX CREA: 1.0000 "application " | TOPICAL_CREAM | Freq: Two times a day (BID) | CUTANEOUS | 2 refills | Status: AC

## 2018-09-26 NOTE — Telephone Encounter (Signed)
TC from [place] w/ Hospice Saw pt - pt is wanting a RF on clotrimazole cream Her tube site is irritating her, she used some that she had left, felt better Would like this called to [facility]

## 2018-09-26 NOTE — Telephone Encounter (Signed)
sent 

## 2018-09-27 NOTE — Telephone Encounter (Signed)
Estill Bamberg with Hospice informed that refill has been sent to pharmacy.

## 2018-10-16 DIAGNOSIS — C099 Malignant neoplasm of tonsil, unspecified: Secondary | ICD-10-CM | POA: Diagnosis not present

## 2018-10-16 DIAGNOSIS — Z931 Gastrostomy status: Secondary | ICD-10-CM | POA: Diagnosis not present

## 2018-10-24 ENCOUNTER — Other Ambulatory Visit: Payer: Self-pay | Admitting: Family Medicine

## 2018-10-30 ENCOUNTER — Telehealth: Payer: Self-pay | Admitting: Physician Assistant

## 2018-10-30 NOTE — Telephone Encounter (Signed)
nEEDS FLU VACCINE RX SENT TO [facility] SO THEY CAN GIVE HER A FLU SHOT.

## 2018-10-30 NOTE — Telephone Encounter (Signed)
Okay to order?

## 2018-10-31 MED ORDER — FLUZONE HIGH-DOSE 0.5 ML IM SUSY: 0.5000 mL | PREFILLED_SYRINGE | Freq: Once | INTRAMUSCULAR | 0 refills | Status: AC

## 2018-10-31 NOTE — Telephone Encounter (Signed)
Rx sent to [facility] 

## 2018-11-01 ENCOUNTER — Other Ambulatory Visit: Payer: Self-pay | Admitting: Physician Assistant

## 2018-11-15 ENCOUNTER — Other Ambulatory Visit: Payer: Self-pay | Admitting: *Deleted

## 2018-11-15 NOTE — Telephone Encounter (Signed)
Erroneous encounter

## 2018-11-20 DIAGNOSIS — C099 Malignant neoplasm of tonsil, unspecified: Secondary | ICD-10-CM | POA: Diagnosis not present

## 2018-11-20 DIAGNOSIS — Z931 Gastrostomy status: Secondary | ICD-10-CM | POA: Diagnosis not present

## 2018-12-25 DEATH — deceased

## 2019-02-23 IMAGING — CT NM PET TUM IMG INITIAL (PI) SKULL BASE T - THIGH
1 of 8 series · 1 of 25 positions shown · non-contrast
Comparison: Soft tissue neck CT 11/06/2017

CLINICAL DATA: Initial treatment strategy for oropharyngeal
carcinoma.

EXAM:
NUCLEAR MEDICINE PET SKULL BASE TO THIGH
TECHNIQUE: 7.3 mCi F-18 FDG was injected intravenously. Full-ring PET imaging
was performed from the skull base to thigh after the radiotracer. CT
data was obtained and used for attenuation correction and anatomic
localization.
Fasting blood glucose: 111 mg/dl

[Series 4: ct hn_sk_th 5.0 b31f · axial · 5.0mm · 0.98mm/px · 1 of 225 slices shown]
[im 225/225  brain]
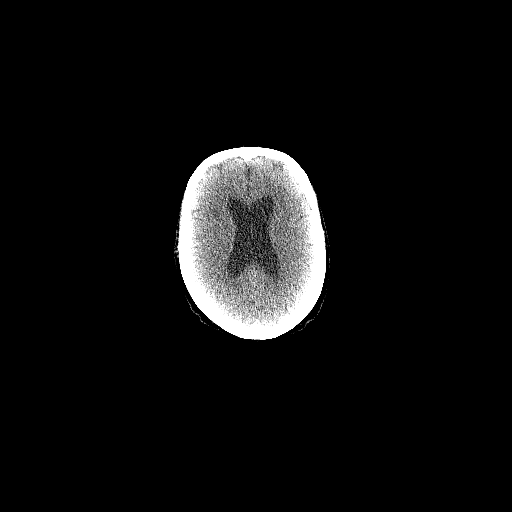

[1 of 25 positions shown; findings below may reference images not displayed]

FINDINGS: Mediastinal blood pool activity: SUV max

NECK: Hypermetabolic mass involving the right side of base of tongue
and right tonsillar region exhibits intense uptake with SUV max of
13.46. A single right level 2 B lymph node measured measures 0.7 cm
with SUV max of 3.85. No additional hypermetabolic cervical lymph
nodes identified.

Incidental CT findings: none

CHEST: No hypermetabolic mediastinal or hilar lymph nodes. No
hypermetabolic axillary or supraclavicular lymph nodes. Subpleural
nodular density within the medial right upper lobe measures 7 mm and
has an SUV max of 2.49, nonspecific.

Incidental CT findings: Multi chamber cardiac enlargement
identified. Small to moderate pericardial effusion. There is aortic
atherosclerosis. Calcifications within the LAD, left circumflex
coronary arteries noted. Dense mitral valve calcifications noted.

ABDOMEN/PELVIS: No abnormal hypermetabolic activity within the
liver, pancreas, adrenal glands, or spleen. No hypermetabolic lymph
nodes in the abdomen or pelvis.

Incidental CT findings: Status post cholecystectomy. Aortic
atherosclerosis noted.

SKELETON: No focal hypermetabolic activity to suggest skeletal
metastasis.

Incidental CT findings: None
IMPRESSION: 1. There is intense radiotracer uptake associated with right
oropharyngeal mass compatible with primary head neck carcinoma.
2. Single right level 2b lymph node exhibits mild increased
radiotracer uptake.
3. No specific findings identified to suggest distant metastatic
disease.
4. Small nonspecific nodular density within the medial right upper
lobe exhibits mild increased uptake. Non-contrast chest CT at 6-12
months is recommended. If the nodule is stable at time of repeat CT,
then future CT at 18-24 months (from today's scan) is considered
optional for low-risk patients, but is recommended for high-risk
patients. This recommendation follows the consensus statement:
Guidelines for Management of Incidental Pulmonary Nodules Detected
on CT Images:From the [HOSPITAL] 4229; published online
before print (10.1148/radiol.3948484858).
5. Cardiac enlargement and multi vessel coronary artery
atherosclerotic calcifications. Aortic Atherosclerosis
(611B3-GNW.W).
6. Pericardial effusion.

## 2019-02-23 IMAGING — CT CT NECK W/ CM
4 of 5 series · 14 of 33 positions shown, 16 images · IV contrast (OMNIPAQUE)
Comparison: PET-CT today reported separately.  Neck CT 11/06/2017.

CLINICAL DATA: 77-year-old female with "throat cancer".

EXAM:
CT NECK WITH CONTRAST
TECHNIQUE: Multidetector CT imaging of the neck was performed using the
standard protocol following the bolus administration of intravenous
contrast.
CONTRAST:  75mL OMNIPAQUE IOHEXOL 300 MG/ML  SOLN

[Series 2: axial neck · axial · 0.51mm/px · z∈[+1428,+1478]mm · 2 of 99 slices shown]
[im 25/99  bone]
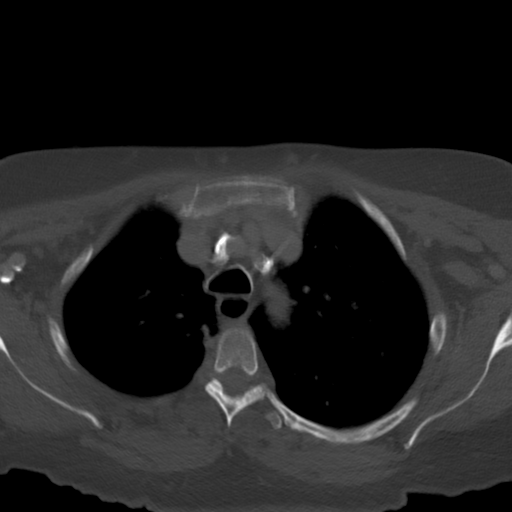
[im 50/99  bone]
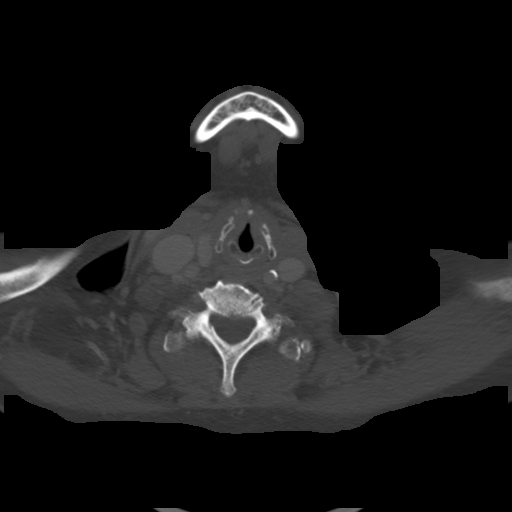

[Series 5: orthogonal ax · axial · 0.39mm/px · z∈[+1380,+1515]mm · 4 of 119 slices shown, 5 images]
[im 24/119  soft-tissue]
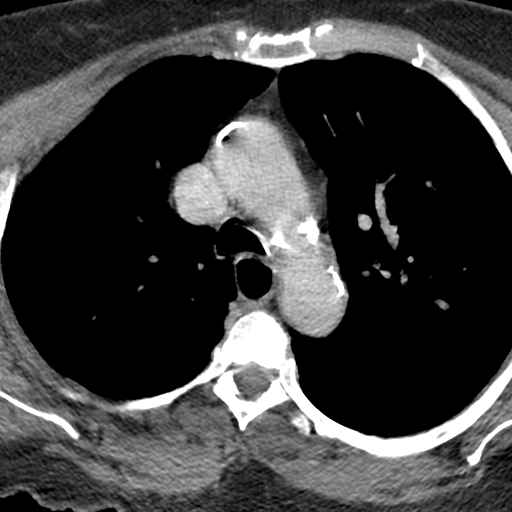
[im 24/119  bone]
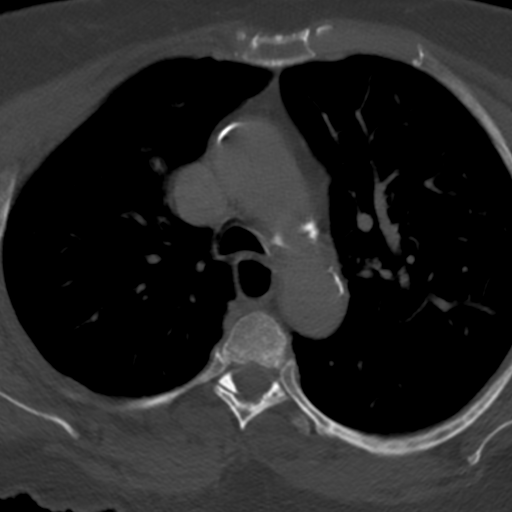
[im 48/119  bone]
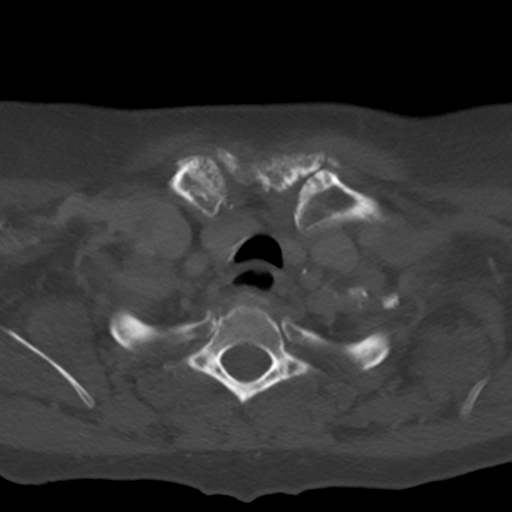
[im 71/119  bone]
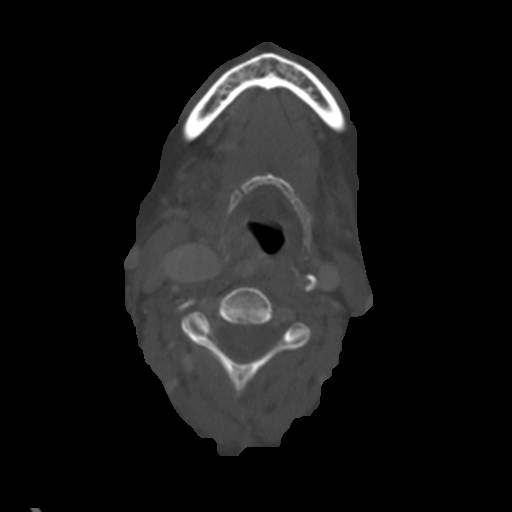
[im 95/119  bone]
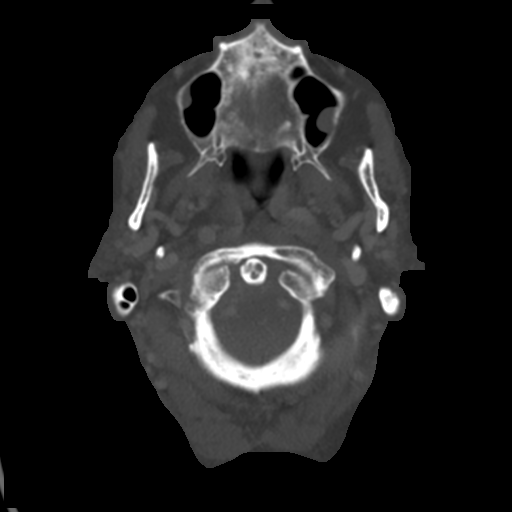

[Series 6: cor neck · coronal · 0.44mm/px · 3 of 85 slices shown]
[im 26/85  bone]
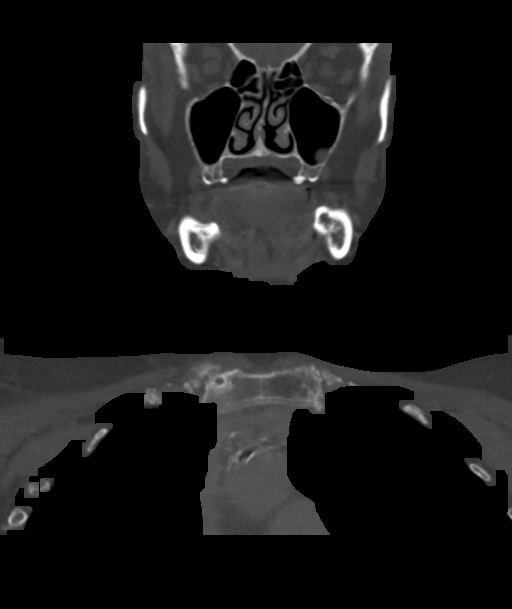
[im 37/85  bone]
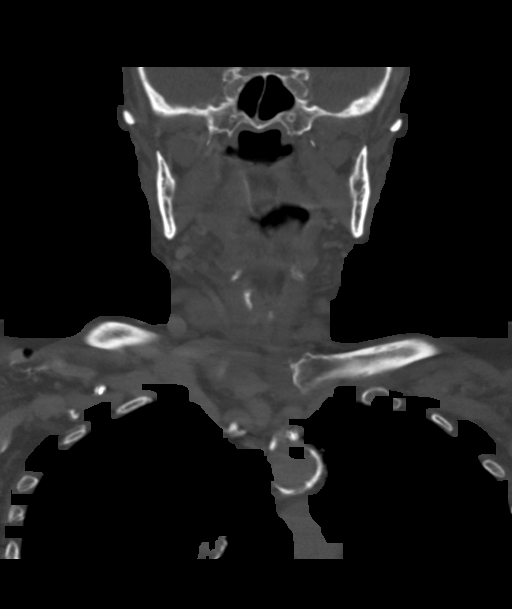
[im 48/85  bone]
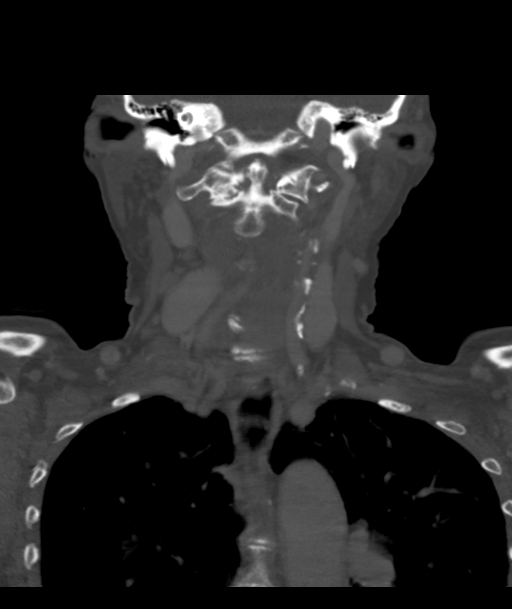

[Series 7: sag neck · sagittal · 0.47mm/px · 5 of 94 slices shown, 6 images]
[im 32/94  bone]
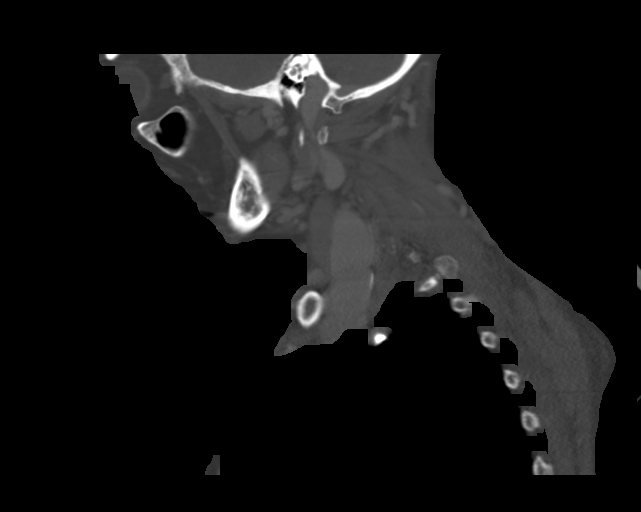
[im 39/94  bone]
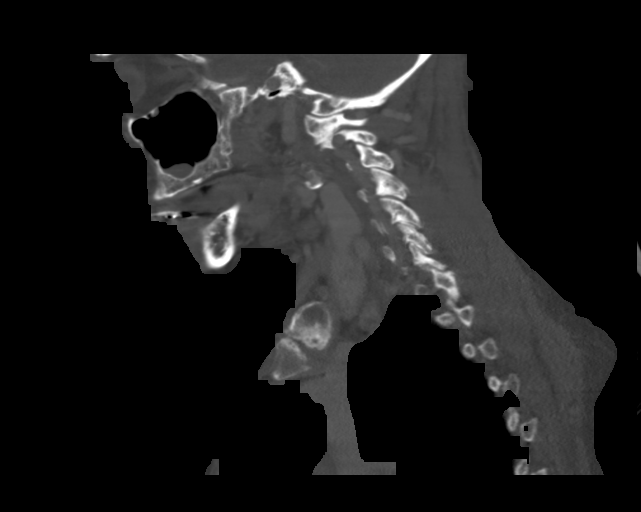
[im 47/94  soft-tissue]
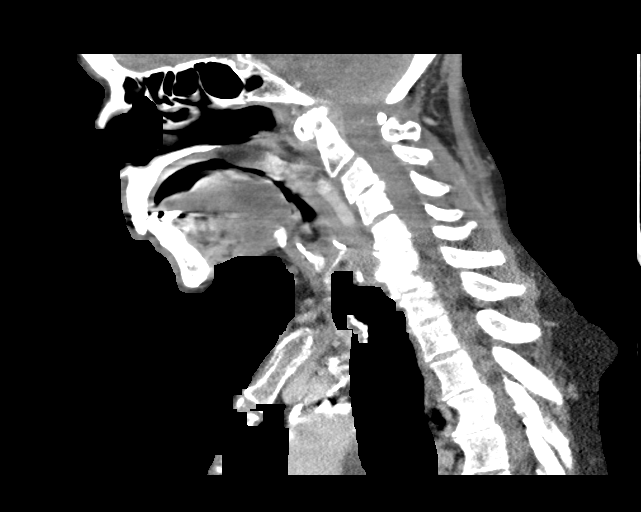
[im 47/94  bone]
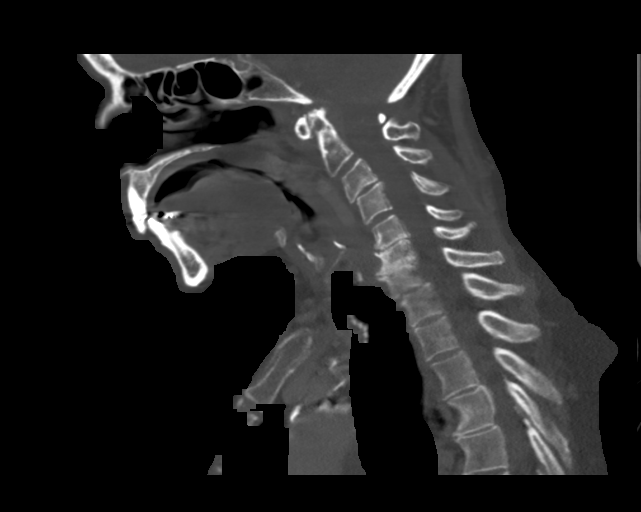
[im 55/94  bone]
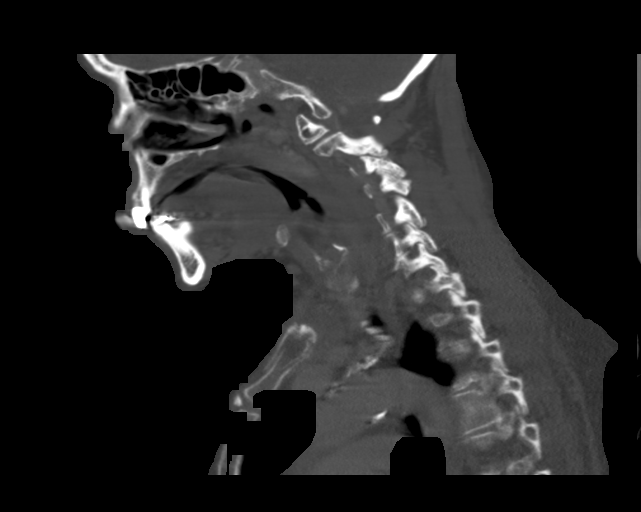
[im 63/94  bone]
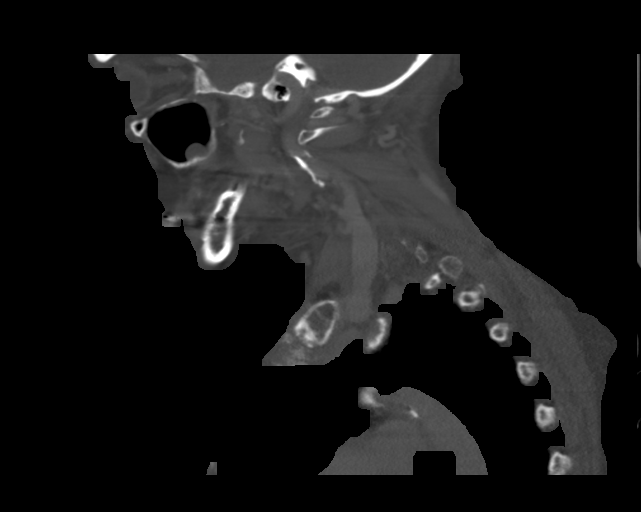

[14 of 33 positions shown; findings below may reference images not displayed]

FINDINGS: Pharynx and larynx: Motion artifact in the oropharynx at the level
of the soft palate, uvula (series 2, image 27) where PET today
suggests there is hypermetabolic lesion. This would beyond series 2,
image 27 of these images. There does appear to be asymmetric soft
tissue in this region encompassing about 2 centimeters as seen on
coronal image 42, but pharyngeal asymmetry here is also partially
related to tortuous course of the right carotid artery which is
partially retropharyngeal.

The remaining pharyngeal and laryngeal soft tissue contours are
stable. Negative parapharyngeal spaces. Questionable mild
retropharyngeal effusion as in [REDACTED]. Retropharyngeal course of
the carotid arteries, more so the right.

Salivary glands: Negative sublingual space. Atrophied submandibular
glands, more so the left. There is also mild asymmetric atrophy of
the parotid glands, more so the right.

Thyroid: Negative.

Lymph nodes: A small but asymmetric right level 2 B lymph node
measuring 6-7 millimeters short axis showed increased activity on
the PET today, seen on series 2, image 38 of this exam. This has
increased from 4 millimeters in [REDACTED].

A right level 3 node measuring 6 millimeters on series 2, image 48
is also asymmetric and mildly increased since [REDACTED], but did not
seen positive on PET today.

No other asymmetric or suspicious lymph node.

Vascular: Tortuous carotid arteries, both heavily calcified and
taking a retropharyngeal course, more so the right. The major
vascular structures in the neck and at the skull base are patent.
High-grade bilateral proximal ICA stenosis due to calcified plaque
is suspected.

Limited intracranial: Negative.

Visualized orbits: Stable, negative.

Mastoids and visualized paranasal sinuses: Stable and well
pneumatized.

Skeleton: Absent posterior maxillary and mandible dentition. Severe
cervical spine degeneration. No acute or suspicious osseous lesion
identified.

Upper chest: Calcified aortic atherosclerosis. No superior
mediastinal lymphadenopathy. Negative visible upper lungs.
IMPRESSION: 1. Motion artifact at the oropharynx/soft palate corresponding to
the level of the abnormal PET activity today.
Allowing for motion, I suspect there is a right palatine tonsil mass
which might involve the right lateral soft palate estimated at 2 cm.
See coronal image 42.
Note also underlying tortuous retropharyngeal right carotid artery.
2. Two small but asymmetric and mildly enlarged since [REDACTED] lymph
nodes at the right level 2 B (abnormal on PET today) and right level
IIIb nodal stations. See series 2, images 38 and 48.
3. High-grade bilateral proximal ICA stenosis suspected due to bulky
calcified plaque.
